# Patient Record
Sex: Female | Born: 1970 | Race: Black or African American | Hispanic: No | Marital: Single | Smoking: Never smoker
Health system: Southern US, Community
[De-identification: ages and names within clinical notes are randomized; demographics above are authoritative.]

## PROBLEM LIST (undated history)

## (undated) DIAGNOSIS — R569 Unspecified convulsions: Secondary | ICD-10-CM

## (undated) DIAGNOSIS — F4310 Post-traumatic stress disorder, unspecified: Secondary | ICD-10-CM

## (undated) DIAGNOSIS — G35 Multiple sclerosis: Secondary | ICD-10-CM

## (undated) DIAGNOSIS — I341 Nonrheumatic mitral (valve) prolapse: Secondary | ICD-10-CM

## (undated) DIAGNOSIS — G35D Multiple sclerosis, unspecified: Secondary | ICD-10-CM

## (undated) DIAGNOSIS — O020 Blighted ovum and nonhydatidiform mole: Secondary | ICD-10-CM

## (undated) DIAGNOSIS — G500 Trigeminal neuralgia: Secondary | ICD-10-CM

## (undated) DIAGNOSIS — G932 Benign intracranial hypertension: Secondary | ICD-10-CM

## (undated) DIAGNOSIS — D509 Iron deficiency anemia, unspecified: Secondary | ICD-10-CM

## (undated) HISTORY — PX: CARPAL TUNNEL RELEASE: SHX101

## (undated) HISTORY — PX: APPENDECTOMY: SHX54

## (undated) HISTORY — PX: VAGINAL HYSTERECTOMY: SUR661

## (undated) HISTORY — PX: BREAST SURGERY: SHX581

## (undated) HISTORY — PX: GASTRIC BYPASS: SHX52

## (undated) HISTORY — PX: BILATERAL SALPINGOOPHORECTOMY: SHX1223

## (undated) HISTORY — PX: CHOLECYSTECTOMY: SHX55

## (undated) HISTORY — PX: TONSILLECTOMY: SUR1361

---

## 2004-02-01 ENCOUNTER — Emergency Department (HOSPITAL_COMMUNITY): Admission: EM | Admit: 2004-02-01 | Discharge: 2004-02-01 | Payer: Self-pay | Admitting: Emergency Medicine

## 2004-02-11 ENCOUNTER — Emergency Department (HOSPITAL_COMMUNITY): Admission: EM | Admit: 2004-02-11 | Discharge: 2004-02-11 | Payer: Self-pay | Admitting: Emergency Medicine

## 2004-10-01 ENCOUNTER — Emergency Department (HOSPITAL_COMMUNITY): Admission: EM | Admit: 2004-10-01 | Discharge: 2004-10-01 | Payer: Self-pay | Admitting: Emergency Medicine

## 2005-09-03 ENCOUNTER — Emergency Department (HOSPITAL_COMMUNITY): Admission: EM | Admit: 2005-09-03 | Discharge: 2005-09-03 | Payer: Self-pay | Admitting: Emergency Medicine

## 2006-09-19 ENCOUNTER — Emergency Department (HOSPITAL_COMMUNITY): Admission: EM | Admit: 2006-09-19 | Discharge: 2006-09-19 | Payer: Self-pay | Admitting: Emergency Medicine

## 2006-12-15 ENCOUNTER — Emergency Department (HOSPITAL_COMMUNITY): Admission: EM | Admit: 2006-12-15 | Discharge: 2006-12-15 | Payer: Self-pay | Admitting: Emergency Medicine

## 2008-09-14 ENCOUNTER — Ambulatory Visit: Payer: Self-pay | Admitting: Radiology

## 2008-09-14 ENCOUNTER — Ambulatory Visit (HOSPITAL_BASED_OUTPATIENT_CLINIC_OR_DEPARTMENT_OTHER): Admission: RE | Admit: 2008-09-14 | Discharge: 2008-09-14 | Payer: Self-pay | Admitting: Family Medicine

## 2008-10-30 ENCOUNTER — Emergency Department (HOSPITAL_BASED_OUTPATIENT_CLINIC_OR_DEPARTMENT_OTHER): Admission: EM | Admit: 2008-10-30 | Discharge: 2008-10-30 | Payer: Self-pay | Admitting: Emergency Medicine

## 2008-10-30 ENCOUNTER — Ambulatory Visit: Payer: Self-pay | Admitting: Diagnostic Radiology

## 2009-03-17 ENCOUNTER — Emergency Department (HOSPITAL_COMMUNITY): Admission: EM | Admit: 2009-03-17 | Discharge: 2009-03-17 | Payer: Self-pay | Admitting: Family Medicine

## 2009-09-30 ENCOUNTER — Ambulatory Visit: Payer: Self-pay | Admitting: Diagnostic Radiology

## 2009-09-30 ENCOUNTER — Emergency Department (HOSPITAL_BASED_OUTPATIENT_CLINIC_OR_DEPARTMENT_OTHER): Admission: EM | Admit: 2009-09-30 | Discharge: 2009-09-30 | Payer: Self-pay | Admitting: Emergency Medicine

## 2009-11-19 ENCOUNTER — Emergency Department (HOSPITAL_BASED_OUTPATIENT_CLINIC_OR_DEPARTMENT_OTHER): Admission: EM | Admit: 2009-11-19 | Discharge: 2009-11-19 | Payer: Self-pay | Admitting: Emergency Medicine

## 2009-11-19 ENCOUNTER — Ambulatory Visit: Payer: Self-pay | Admitting: Diagnostic Radiology

## 2009-12-22 ENCOUNTER — Emergency Department (HOSPITAL_BASED_OUTPATIENT_CLINIC_OR_DEPARTMENT_OTHER): Admission: EM | Admit: 2009-12-22 | Discharge: 2009-12-22 | Payer: Self-pay | Admitting: Emergency Medicine

## 2010-05-17 LAB — CBC
HCT: 37.5 % (ref 36.0–46.0)
Hemoglobin: 12.9 g/dL (ref 12.0–15.0)
MCH: 33.1 pg (ref 26.0–34.0)
MCHC: 34.3 g/dL (ref 30.0–36.0)
MCV: 96.5 fL (ref 78.0–100.0)
Platelets: 181 10*3/uL (ref 150–400)
RBC: 3.89 MIL/uL (ref 3.87–5.11)
RDW: 12.7 % (ref 11.5–15.5)
WBC: 5.3 10*3/uL (ref 4.0–10.5)

## 2010-05-17 LAB — BASIC METABOLIC PANEL
BUN: 9 mg/dL (ref 6–23)
CO2: 29 mEq/L (ref 19–32)
Calcium: 9 mg/dL (ref 8.4–10.5)
Chloride: 105 mEq/L (ref 96–112)
Creatinine, Ser: 0.9 mg/dL (ref 0.4–1.2)
GFR calc Af Amer: >60 mL/min (ref >60)
GFR calc non Af Amer: >60 mL/min (ref >60)
Glucose, Bld: 95 mg/dL (ref 70–99)
Potassium: 4.2 mEq/L (ref 3.5–5.1)
Sodium: 141 mEq/L (ref 135–145)

## 2010-05-19 LAB — CBC
HCT: 36.1 % (ref 36.0–46.0)
Hemoglobin: 12.3 g/dL (ref 12.0–15.0)
MCH: 32.8 pg (ref 26.0–34.0)
MCHC: 34 g/dL (ref 30.0–36.0)
MCV: 96.2 fL (ref 78.0–100.0)
Platelets: 203 10*3/uL (ref 150–400)
RBC: 3.75 MIL/uL — ABNORMAL LOW (ref 3.87–5.11)
RDW: 12.8 % (ref 11.5–15.5)
WBC: 6.3 10*3/uL (ref 4.0–10.5)

## 2010-05-19 LAB — URINALYSIS, ROUTINE W REFLEX MICROSCOPIC
Bilirubin Urine: NEGATIVE
Glucose, UA: NEGATIVE mg/dL
Ketones, ur: NEGATIVE mg/dL
Leukocytes, UA: NEGATIVE
Nitrite: NEGATIVE
Protein, ur: NEGATIVE mg/dL
Specific Gravity, Urine: 1.022 (ref 1.005–1.030)
Urobilinogen, UA: 1 mg/dL (ref 0.0–1.0)
pH: 7 (ref 5.0–8.0)

## 2010-05-19 LAB — COMPREHENSIVE METABOLIC PANEL
ALT: 25 U/L (ref 0–35)
AST: 26 U/L (ref 0–37)
Albumin: 3.7 g/dL (ref 3.5–5.2)
Alkaline Phosphatase: 57 U/L (ref 39–117)
BUN: 10 mg/dL (ref 6–23)
CO2: 26 mEq/L (ref 19–32)
Calcium: 8.8 mg/dL (ref 8.4–10.5)
Chloride: 108 mEq/L (ref 96–112)
Creatinine, Ser: 0.9 mg/dL (ref 0.4–1.2)
GFR calc Af Amer: >60 mL/min (ref >60)
GFR calc non Af Amer: >60 mL/min (ref >60)
Glucose, Bld: 81 mg/dL (ref 70–99)
Potassium: 4 mEq/L (ref 3.5–5.1)
Sodium: 141 mEq/L (ref 135–145)
Total Bilirubin: 0.6 mg/dL (ref 0.3–1.2)
Total Protein: 6.7 g/dL (ref 6.0–8.3)

## 2010-05-19 LAB — URINE CULTURE
Colony Count: NO GROWTH
Culture: NO GROWTH

## 2010-05-19 LAB — URINE MICROSCOPIC-ADD ON

## 2010-05-19 LAB — DIFFERENTIAL
Basophils Absolute: 0 10*3/uL (ref 0.0–0.1)
Basophils Relative: 1 % (ref 0–1)
Eosinophils Absolute: 0.1 10*3/uL (ref 0.0–0.7)
Eosinophils Relative: 1 % (ref 0–5)
Lymphocytes Relative: 35 % (ref 12–46)
Lymphs Abs: 2.2 10*3/uL (ref 0.7–4.0)
Monocytes Absolute: 0.6 10*3/uL (ref 0.1–1.0)
Monocytes Relative: 9 % (ref 3–12)
Neutro Abs: 3.4 10*3/uL (ref 1.7–7.7)
Neutrophils Relative %: 55 % (ref 43–77)

## 2010-05-19 LAB — LIPASE, BLOOD: Lipase: 96 U/L (ref 23–300)

## 2010-05-19 LAB — PREGNANCY, URINE: Preg Test, Ur: NEGATIVE

## 2010-05-20 LAB — POCT URINALYSIS DIP (DEVICE)
Bilirubin Urine: NEGATIVE
Glucose, UA: NEGATIVE mg/dL
Ketones, ur: NEGATIVE mg/dL
Nitrite: NEGATIVE
Protein, ur: NEGATIVE mg/dL
Specific Gravity, Urine: 1.015 (ref 1.005–1.030)
Urobilinogen, UA: 0.2 mg/dL (ref 0.0–1.0)
pH: 5 (ref 5.0–8.0)

## 2010-05-20 LAB — URINE CULTURE
Colony Count: NO GROWTH
Culture: NO GROWTH

## 2010-06-09 LAB — COMPREHENSIVE METABOLIC PANEL
ALT: 16 U/L (ref 0–35)
Alkaline Phosphatase: 68 U/L (ref 39–117)
BUN: 9 mg/dL (ref 6–23)
CO2: 29 mEq/L (ref 19–32)
Calcium: 9.3 mg/dL (ref 8.4–10.5)
GFR calc non Af Amer: >60 mL/min (ref >60)
Glucose, Bld: 85 mg/dL (ref 70–99)
Sodium: 140 mEq/L (ref 135–145)

## 2010-06-09 LAB — DIFFERENTIAL
Basophils Relative: 1 % (ref 0–1)
Eosinophils Absolute: 0 10*3/uL (ref 0.0–0.7)
Neutro Abs: 4.4 10*3/uL (ref 1.7–7.7)
Neutrophils Relative %: 53 % (ref 43–77)

## 2010-06-09 LAB — URINALYSIS, ROUTINE W REFLEX MICROSCOPIC
Bilirubin Urine: NEGATIVE
Ketones, ur: NEGATIVE mg/dL
Nitrite: NEGATIVE
Protein, ur: NEGATIVE mg/dL
Urobilinogen, UA: 0.2 mg/dL (ref 0.0–1.0)

## 2010-06-09 LAB — CBC
HCT: 37.8 % (ref 36.0–46.0)
Hemoglobin: 13.2 g/dL (ref 12.0–15.0)
MCHC: 34.9 g/dL (ref 30.0–36.0)
MCV: 92.8 fL (ref 78.0–100.0)
Platelets: 201 10*3/uL (ref 150–400)
RBC: 4.07 MIL/uL (ref 3.87–5.11)
RDW: 11.9 % (ref 11.5–15.5)
WBC: 8.1 10*3/uL (ref 4.0–10.5)

## 2010-06-09 LAB — PREGNANCY, URINE: Preg Test, Ur: NEGATIVE

## 2010-06-09 LAB — LIPASE, BLOOD: Lipase: 79 U/L (ref 23–300)

## 2010-06-25 ENCOUNTER — Emergency Department (HOSPITAL_BASED_OUTPATIENT_CLINIC_OR_DEPARTMENT_OTHER)
Admission: EM | Admit: 2010-06-25 | Discharge: 2010-06-25 | Disposition: A | Discharge status: Home / Self Care | Payer: Federal, State, Local not specified - PPO | Attending: Emergency Medicine | Admitting: Emergency Medicine

## 2010-06-25 DIAGNOSIS — G43909 Migraine, unspecified, not intractable, without status migrainosus: Secondary | ICD-10-CM | POA: Insufficient documentation

## 2010-06-25 DIAGNOSIS — Z79899 Other long term (current) drug therapy: Secondary | ICD-10-CM | POA: Insufficient documentation

## 2010-07-20 NOTE — Consult Note (Signed)
NAME:  Joyce Lang, Joyce Lang                 ACCOUNT NO.:  [id number]   MEDICAL RECORD NO.:  [id number]          PATIENT TYPE:  EMS   LOCATION:  ED                           FACILITY:  [facility]   PHYSICIAN:  Lorne Skeens. Hoxworth, M.D.DATE OF BIRTH:  September 08, 1970   DATE OF CONSULTATION:  02/01/2004  DATE OF DISCHARGE:                                   CONSULTATION   CHIEF COMPLAINT:  Right upper quadrant abdominal pain, nausea and vomiting.   HISTORY OF PRESENT ILLNESS:  I was asked by Carleene Cooper, M.D. in the  [facility] Emergency Room to evaluate Ms. Joyce Lang. She is a 40 year old black  female with a fairly long history of recurrent worsening right upper  quadrant abdominal pain.  She states that she actually has been having  episodes of right upper quadrant abdominal pain for about 10 years.  Initially these were not particularly frequent or severe and she did not  seek medical attention.  However, over the past year or two and particularly  over the last several months, she has had progressively worsening symptoms.  She describes a severe aching pain that will come on fairly rapidly in the  right upper quadrant up under her rib cage. There is no radiation. She will  then have the onset of nausea and frequent vomiting.  The pain typically  would last for several hours and gradually go away.  Until recently, she  definitely noted that fatty or greasy food seemed to precipitate the pain  and then more recently it has come on without eating. She presented to the  [facility] Emergency Room today as she has been having essentially  continuing waxing and waning symptoms over the past three days.  She has not  noted any fever, chills or jaundice. Bowel movements are normal.   She has had a fairly extensive workup to date and actually had an  appointment at our office scheduled for the 9th of next month.  Her workup  initially was done in [place] which included an ultrasound of the  gallbladder that  was normal and a HIDA scan that she stated was borderline  abnormal. These were done earlier this year. I do not have these studies to  review but the reports have apparently been already sent to my office in  preparation for appointment next month.  She recently was evaluated by Dr.  Army Melia in [place] with an upper endoscopy.  This reportedly revealed a  hiatal hernia and some evidence of reflux esophagitis.  He recommended  Aciphex which she started two weeks ago but this has not affected her  symptoms.  He also recommended that she seek surgical consultation if the  Aciphex did not relieve her symptoms.  She also has a history of a  colonoscopy in 2003 due to strong family history of colon cancer.  Overall  her symptoms have progressed to the point where she feels they are really  intolerable.   PAST MEDICAL HISTORY:  Surgery significant for carpal tunnel release,  bilateral breast reduction, tonsillectomy, D&C and tubal ligation.  Medically she  has been treated for migraine headaches.   MEDICATIONS:  1.  Effexor 150 mg daily.  2.  Topamax 75 mg daily for headaches.   ALLERGIES:  None noted.   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 She is married with two children. Does not smoke  cigarettes or drink alcohol.   FAMILY HISTORY:  Significant for colon cancer in aunts and uncles on both  sides of the family.  Also significant for gallbladder disease, mother  status post cholecystectomy.   REVIEW OF SYMPTOMS:  GENERAL:  No fever, chills, weight change. HEENT:  No  vision, hearing or swallowing problems.  RESPIRATORY:  No shortness of  breath, cough, wheezing, history of asthma. CARDIAC:  She has been told she  had mitral valve prolapse in the distant past.  No chest pain, palpitations.  ABDOMEN/GI:  As above.  GU:  No urinary burning or frequency. HEMATOLOGIC:  No history of abnormal bleeding, blood clots. NEUROLOGIC:   Occasional  migraine headaches.   PHYSICAL EXAMINATION:  VITAL SIGNS:  Temperature 99, vital signs all within  normal limits.  GENERAL:  She is a mildly overweight black female who appears uncomfortable.  SKIN:  Warm and dry without rash or infection.  HEENT:  No palpable masses or thyromegaly, sclerae nonicteric, nares and  oropharynx clear.  LUNGS:  Clear to auscultation without increased work of breathing or  wheezing.  CARDIAC:  Regular rate and rhythm without murmurs, no edema. No JVD.  ABDOMEN:  There is moderate right upper quadrant tenderness, no guarding, no  masses, no hepatosplenomegaly. The remainder of the abdomen is soft and  nontender.  EXTREMITIES:  No joint swelling or deformity.  NEUROLOGIC:  Alert and oriented, gait normal.   LABORATORY AND X-RAY DATA:  In the emergency room today, CBC, LFTs, lipase,  urinalysis were all unremarkable.   CT scan of the abdomen and pelvis was obtained in the emergency room today  which showed some tiny follicular cysts in both ovaries but is essentially  negative.   ASSESSMENT/PLAN:  A 40 year old black female with long standing and  progressively worsening episode right upper quadrant abdominal pain and  nausea entirely consistent with biliary tract disease. She has had a  thorough workup including gallbladder ultrasound and questionably abnormal  HIDA scan.  I will need to review these studies. She has had an upper  endoscopy showing only reflux and has had no response to proton pump  inhibitors.  CT scan of the abdomen and pelvis was normal.  Colonoscopy was  normal.  I suspect she may well have biliary dyskinesia.  I do not think  there is any further workup that would be helpful. Her symptoms have become  intolerable. We discussed the option of proceeding with laparoscopic  cholecystectomy with cholangiogram in an effort to relieve her symptoms. She understands in this setting that results of the surgery are not 100%   reliable for pain relief and that about 50-75% of patients will obtain  relief from surgery. At this point, she feels her symptoms are not tolerable  and would like to proceed with cholecystectomy in an effort to relieve her  pain and I think this would be a reasonable approach. Will schedule this in  the near future.  The risk of bleeding, infection, bile leak and bile duct  injury were discussed with the patient and her husband.     Benj   BTH/MEDQ  D:  02/01/2004  T:  02/01/2004  Job:  [id number]

## 2010-12-15 ENCOUNTER — Encounter: Payer: Self-pay | Admitting: *Deleted

## 2010-12-15 ENCOUNTER — Emergency Department (HOSPITAL_BASED_OUTPATIENT_CLINIC_OR_DEPARTMENT_OTHER)
Admission: EM | Admit: 2010-12-15 | Discharge: 2010-12-16 | Disposition: A | Discharge status: Home / Self Care | Payer: Federal, State, Local not specified - PPO | Attending: Emergency Medicine | Admitting: Emergency Medicine

## 2010-12-15 ENCOUNTER — Other Ambulatory Visit: Payer: Self-pay

## 2010-12-15 DIAGNOSIS — Z79899 Other long term (current) drug therapy: Secondary | ICD-10-CM | POA: Insufficient documentation

## 2010-12-15 DIAGNOSIS — R112 Nausea with vomiting, unspecified: Secondary | ICD-10-CM | POA: Insufficient documentation

## 2010-12-15 DIAGNOSIS — G43901 Migraine, unspecified, not intractable, with status migrainosus: Secondary | ICD-10-CM

## 2010-12-15 DIAGNOSIS — G43809 Other migraine, not intractable, without status migrainosus: Secondary | ICD-10-CM | POA: Insufficient documentation

## 2010-12-15 HISTORY — DX: Benign intracranial hypertension: G93.2

## 2010-12-15 HISTORY — DX: Trigeminal neuralgia: G50.0

## 2010-12-15 HISTORY — DX: Blighted ovum and nonhydatidiform mole: O02.0

## 2010-12-15 HISTORY — DX: Unspecified convulsions: R56.9

## 2010-12-15 HISTORY — DX: Post-traumatic stress disorder, unspecified: F43.10

## 2010-12-15 MED ORDER — DEXAMETHASONE SODIUM PHOSPHATE 10 MG/ML IJ SOLN
10.0000 mg | Freq: Once | INTRAMUSCULAR | Status: AC
  Administered 2010-12-16: 10 mg via INTRAVENOUS
  Filled 2010-12-15: qty 1

## 2010-12-15 MED ORDER — SODIUM CHLORIDE 0.9 % IV BOLUS (SEPSIS)
1000.0000 mL | Freq: Once | INTRAVENOUS | Status: AC
  Administered 2010-12-16: 1000 mL via INTRAVENOUS

## 2010-12-15 MED ORDER — DIPHENHYDRAMINE HCL 50 MG/ML IJ SOLN
25.0000 mg | Freq: Once | INTRAMUSCULAR | Status: AC
  Administered 2010-12-16: 25 mg via INTRAVENOUS
  Filled 2010-12-15: qty 1

## 2010-12-15 MED ORDER — METOCLOPRAMIDE HCL 5 MG/ML IJ SOLN
10.0000 mg | Freq: Once | INTRAMUSCULAR | Status: AC
  Administered 2010-12-16: 10 mg via INTRAVENOUS
  Filled 2010-12-15 (×2): qty 2

## 2010-12-15 NOTE — ED Notes (Signed)
Pt reports mha x 3 weeks- saw her neurologist 1.5 weeks ago- and received magnesium infusion in office- has been taking vicodin, imitrex and phenergan without relief

## 2010-12-15 NOTE — ED Provider Notes (Addendum)
History    Scribed for Hanley Seamen, MD, the patient was seen in room MH06/MH06. This chart was scribed by Katha Cabal. This patient's care was started at 11:49 PM.    CSN: [id number] Arrival date & time: 12/15/2010 10:58 PM  Chief Complaint  Patient presents with  . Migraine    (Consider location/radiation/quality/duration/timing/severity/associated sxs/prior treatment) HPI  Joyce Lang is a 40 y.o. female who presents to the Emergency Department complaining of ongoing  severe migraine headache for the past 3 weeks with associated nausea and vomiting.  Typical right sided migraine that makes patients face feel numb.  Pain is aggravated by light and sound.  Patient given Toradol by neurologist but pain returned in an hour.  Patient took Phenergan, Imitrex and Vicodin without relief.  Patient states she "can't function with the pain."   Patient states that migraines in past were successfully treated with steroids, IV pain medication and antiemetic.  Patient has hx of  Pseudotumor cerebri but states sx are not the same.     Past Medical History  Diagnosis Date  . Migraine   . Trigeminal neuralgia   . Molar pregnancy   . Seizures   . PTSD (post-traumatic stress disorder)   . Pseudotumor cerebri     Past Surgical History  Procedure Date  . Appendectomy   . Breast surgery   . Cholecystectomy   . Gastric bypass   . Abdominal hysterectomy   . Tonsillectomy   . Carpal tunnel release     No family history on file.  History  Substance Use Topics  . Smoking status: Never Smoker   . Smokeless tobacco: Never Used  . Alcohol Use: Yes     rare    OB History    Grav Para Term Preterm Abortions TAB SAB Ect Mult Living                  Review of Systems  All other systems reviewed and are negative.    Allergies  Demerol and Morphine and related  Home Medications   Current Outpatient Rx  Name Route Sig Dispense Refill  . ACETAZOLAMIDE 250 MG PO TABS Oral Take 250  mg by mouth 2 (two) times daily.      . BUSPIRONE HCL 7.5 MG PO TABS Oral Take 7.5 mg by mouth daily.      Marland Kitchen CETIRIZINE HCL 10 MG PO TABS Oral Take 10 mg by mouth daily.      Marland Kitchen CITALOPRAM HYDROBROMIDE 10 MG PO TABS Oral Take 10 mg by mouth daily.      Marland Kitchen GABAPENTIN 600 MG PO TABS Oral Take 600 mg by mouth daily.      Marland Kitchen HYDROCODONE-ACETAMINOPHEN 5-325 MG PO TABS Oral Take 1 tablet by mouth every 6 (six) hours as needed.      Marland Kitchen LAMOTRIGINE 200 MG PO TB24 Oral Take 1 tablet by mouth daily.      Marland Kitchen PROMETHAZINE HCL 25 MG PO TABS Oral Take 25 mg by mouth every 6 (six) hours as needed.      . SUMATRIPTAN SUCCINATE 100 MG PO TABS Oral Take 100 mg by mouth every 2 (two) hours as needed.      . TOPIRAMATE 100 MG PO TABS Oral Take 100 mg by mouth 2 (two) times daily.      Marland Kitchen VALACYCLOVIR HCL 1 G PO TABS Oral Take 1,000 mg by mouth daily.        BP 199/80  Pulse 85  Temp(Src) 97.9 F (36.6 C) (Oral)  Resp 20  Ht 5\' 6"  (1.676 m)  Wt 163 lb (73.936 kg)  BMI 26.31 kg/m2  SpO2 100%  Physical Exam General: Well-developed, well-nourished female in no acute distress; appearance consistent with age of record HENT: normocephalic, atraumatic Eyes: pupils equal round and reactive to light; extraocular muscles intact Neck: supple Heart: regular rate and rhythm; no murmurs, rubs or gallops Lungs: clear to auscultation bilaterally Abdomen: soft; nontender; nondistended; no masses or hepatosplenomegaly; bowel sounds present Extremities: No deformity; full range of motion; pulses normal Neurologic: Awake, alert and oriented;motor function intact in all extremities and symmetric; no facial droop Skin: Warm and dry Psychiatric: Normal mood and affect   ED Course  Procedures (including critical care time)   DIAGNOSTIC STUDIES: Oxygen Saturation is 100% on room air, normal by my interpretation.    COORDINATION OF CARE:  11:49 PM  Patient having EKG.   11:55 PM  Physical exam complete.  Pain Control.   Will review past records.    12:34 AM Patient states she is feeling better and wished to be discharged home at this time. She states she's had an excellent relief from similar medication combinations tenderness ED in the past.  EKG Interpretation:  Date & Time: 12/16/2010 11:51 PM  Rate: 89  Rhythm: normal sinus rhythm  QRS Axis: normal  Intervals: normal  ST/T Wave abnormalities: normal  Conduction Disutrbances:none  Narrative Interpretation:   Old EKG Reviewed: none available       Hanley Seamen, MD 12/16/10 0034  Hanley Seamen, MD 12/16/10 [phone]

## 2010-12-16 NOTE — ED Notes (Signed)
Pt d/c home with family to drive- no rx given

## 2010-12-17 LAB — COMPREHENSIVE METABOLIC PANEL
ALT: 25
AST: 23
Albumin: 3.7
Calcium: 8.7
GFR calc Af Amer: >60
Glucose, Bld: 94
Sodium: 134 — ABNORMAL LOW
Total Protein: 6.8

## 2010-12-17 LAB — DIFFERENTIAL
Eosinophils Absolute: 0.1
Lymphs Abs: 3.2
Monocytes Relative: 6
Neutrophils Relative %: 62

## 2010-12-17 LAB — CBC
MCHC: 34.4
Platelets: 188
RDW: 12.5

## 2010-12-17 LAB — POCT CARDIAC MARKERS
CKMB, poc: <1 — ABNORMAL LOW
Troponin i, poc: <0.05

## 2010-12-17 LAB — TSH: TSH: 0.855

## 2010-12-28 ENCOUNTER — Other Ambulatory Visit: Payer: Self-pay | Admitting: Family Medicine

## 2010-12-28 DIAGNOSIS — Z1231 Encounter for screening mammogram for malignant neoplasm of breast: Secondary | ICD-10-CM

## 2011-01-14 ENCOUNTER — Ambulatory Visit: Payer: Federal, State, Local not specified - PPO

## 2011-01-14 ENCOUNTER — Ambulatory Visit
Admission: RE | Admit: 2011-01-14 | Discharge: 2011-01-14 | Disposition: A | Discharge status: Home / Self Care | Payer: Federal, State, Local not specified - PPO | Source: Ambulatory Visit | Attending: Family Medicine | Admitting: Family Medicine

## 2011-01-14 DIAGNOSIS — Z1231 Encounter for screening mammogram for malignant neoplasm of breast: Secondary | ICD-10-CM

## 2011-01-15 ENCOUNTER — Other Ambulatory Visit: Payer: Self-pay | Admitting: Family Medicine

## 2011-01-15 DIAGNOSIS — N644 Mastodynia: Secondary | ICD-10-CM

## 2011-01-30 ENCOUNTER — Ambulatory Visit
Admission: RE | Admit: 2011-01-30 | Discharge: 2011-01-30 | Disposition: A | Discharge status: Home / Self Care | Payer: Federal, State, Local not specified - PPO | Source: Ambulatory Visit | Attending: Family Medicine | Admitting: Family Medicine

## 2011-01-30 DIAGNOSIS — N644 Mastodynia: Secondary | ICD-10-CM

## 2011-05-24 ENCOUNTER — Emergency Department (HOSPITAL_BASED_OUTPATIENT_CLINIC_OR_DEPARTMENT_OTHER)
Admission: EM | Admit: 2011-05-24 | Discharge: 2011-05-24 | Disposition: A | Discharge status: Home / Self Care | Payer: Federal, State, Local not specified - PPO | Attending: Emergency Medicine | Admitting: Emergency Medicine

## 2011-05-24 ENCOUNTER — Emergency Department (INDEPENDENT_AMBULATORY_CARE_PROVIDER_SITE_OTHER): Payer: Federal, State, Local not specified - PPO

## 2011-05-24 ENCOUNTER — Encounter (HOSPITAL_BASED_OUTPATIENT_CLINIC_OR_DEPARTMENT_OTHER): Payer: Self-pay | Admitting: *Deleted

## 2011-05-24 DIAGNOSIS — G43909 Migraine, unspecified, not intractable, without status migrainosus: Secondary | ICD-10-CM | POA: Insufficient documentation

## 2011-05-24 DIAGNOSIS — R51 Headache: Secondary | ICD-10-CM

## 2011-05-24 DIAGNOSIS — Z79899 Other long term (current) drug therapy: Secondary | ICD-10-CM | POA: Insufficient documentation

## 2011-05-24 DIAGNOSIS — R110 Nausea: Secondary | ICD-10-CM | POA: Insufficient documentation

## 2011-05-24 MED ORDER — PROMETHAZINE HCL 25 MG/ML IJ SOLN
12.5000 mg | Freq: Once | INTRAMUSCULAR | Status: AC
  Administered 2011-05-24: 12.5 mg via INTRAVENOUS
  Filled 2011-05-24: qty 1

## 2011-05-24 MED ORDER — FENTANYL CITRATE 0.05 MG/ML IJ SOLN
50.0000 ug | Freq: Once | INTRAMUSCULAR | Status: AC
  Administered 2011-05-24: 08:00:00 via INTRAVENOUS
  Filled 2011-05-24: qty 2

## 2011-05-24 MED ORDER — DIPHENHYDRAMINE HCL 50 MG/ML IJ SOLN
25.0000 mg | Freq: Once | INTRAMUSCULAR | Status: AC
  Administered 2011-05-24: 25 mg via INTRAVENOUS
  Filled 2011-05-24: qty 1

## 2011-05-24 MED ORDER — KETOROLAC TROMETHAMINE 30 MG/ML IJ SOLN
30.0000 mg | Freq: Once | INTRAMUSCULAR | Status: AC
  Administered 2011-05-24: 30 mg via INTRAVENOUS
  Filled 2011-05-24: qty 1

## 2011-05-24 MED ORDER — SODIUM CHLORIDE 0.9 % IV BOLUS (SEPSIS)
1000.0000 mL | Freq: Once | INTRAVENOUS | Status: AC
  Administered 2011-05-24: 1000 mL via INTRAVENOUS

## 2011-05-24 MED ORDER — METOCLOPRAMIDE HCL 5 MG/ML IJ SOLN
10.0000 mg | Freq: Once | INTRAMUSCULAR | Status: AC
  Administered 2011-05-24: 10 mg via INTRAVENOUS
  Filled 2011-05-24: qty 2

## 2011-05-24 MED ORDER — DIVALPROEX SODIUM 250 MG PO DR TAB
500.0000 mg | DELAYED_RELEASE_TABLET | Freq: Once | ORAL | Status: DC
  Filled 2011-05-24: qty 1

## 2011-05-24 NOTE — Discharge Instructions (Signed)
Migraine Headache  A migraine is very bad pain on one or both sides of your head. The cause of a migraine is not always known. A migraine can be triggered or caused by different things, such as:   Alcohol.   Smoking.   Stress.   Periods (menstruation) in women.   Aged cheeses.   Foods or drinks that contain nitrates, glutamate, aspartame, or tyramine.   Lack of sleep.   Chocolate.   Caffeine.   Hunger.   Medicines, such as nitroglycerine (used to treat chest pain), birth control pills, estrogen, and some blood pressure medicines.  HOME CARE   Many medicines can help migraine pain or keep migraines from coming back. Your doctor can help you decide on a medicine or treatment program.   If you or your child gets a migraine, it may help to lie down in a dark, quiet room.   Keep a headache journal. This may help find out what is causing the headaches. For example, write down:   What you eat and drink.   How much sleep you get.   Any change to your diet or medicines.  GET HELP RIGHT AWAY IF:    The medicine does not work.   The pain begins again.   The neck is stiff.   You have trouble seeing.   The muscles are weak or you lose muscle control.   You have new symptoms.   You lose your balance.   You have trouble walking.   You feel faint or pass out.  MAKE SURE YOU:    Understand these instructions.   Will watch this condition.   Will get help right away if you are not doing well or get worse.  Document Released: 11/28/2007 Document Revised: 02/07/2011 Document Reviewed: 10/24/2008  ExitCare Patient Information 2012 [employer].

## 2011-05-24 NOTE — ED Notes (Signed)
Dr. Palumbo at bedside. 

## 2011-05-24 NOTE — ED Notes (Signed)
C/o continuous headache x2 weeks, but has worsened severely 2days ago. Pt states she gets migraines frequntly "but this is a bad one". + photosensitivity. +nausea, no vomiting.

## 2011-05-24 NOTE — ED Provider Notes (Signed)
History     CSN: [id number]  Arrival date & time 05/24/11  0546   First MD Initiated Contact with Patient 05/24/11 [phone]      Chief Complaint  Patient presents with  . Headache    (Consider location/radiation/quality/duration/timing/severity/associated sxs/prior treatment) Patient is a 41 y.o. female presenting with headaches. The history is provided by the patient. No language interpreter was used.  Headache  This is a recurrent problem. The current episode started more than 1 week ago. The problem occurs constantly. The problem has not changed since onset.The headache is associated with bright light. The pain is located in the occipital and right unilateral region. The pain is at a severity of 9/10. The pain is severe. The pain does not radiate. Associated symptoms include nausea. Pertinent negatives include no anorexia, no fever, no malaise/fatigue, no chest pressure, no near-syncope, no orthopnea, no palpitations, no syncope, no shortness of breath and no vomiting. She has tried resting in a darkened room and triptan therapy (IV magnesium) for the symptoms. The treatment provided mild relief.    Past Medical History  Diagnosis Date  . Migraine   . Trigeminal neuralgia   . Molar pregnancy   . Seizures   . PTSD (post-traumatic stress disorder)   . Pseudotumor cerebri     Past Surgical History  Procedure Date  . Appendectomy   . Breast surgery   . Cholecystectomy   . Gastric bypass   . Abdominal hysterectomy   . Tonsillectomy   . Carpal tunnel release     No family history on file.  History  Substance Use Topics  . Smoking status: Never Smoker   . Smokeless tobacco: Never Used  . Alcohol Use: Yes     rare    OB History    Grav Para Term Preterm Abortions TAB SAB Ect Mult Living                  Review of Systems  Constitutional: Negative for fever and malaise/fatigue.  HENT: Negative for facial swelling and neck stiffness.   Eyes: Negative.  Negative for  visual disturbance.  Respiratory: Negative for shortness of breath.   Cardiovascular: Negative for palpitations, orthopnea, syncope and near-syncope.  Gastrointestinal: Positive for nausea. Negative for vomiting and anorexia.  Genitourinary: Negative.   Musculoskeletal: Negative.   Skin: Negative.   Neurological: Positive for headaches.  Hematological: Negative.   Psychiatric/Behavioral: Negative.     Allergies  Demerol and Morphine and related  Home Medications   Current Outpatient Rx  Name Route Sig Dispense Refill  . ACETAZOLAMIDE 250 MG PO TABS Oral Take 375 mg by mouth 2 (two) times daily.     . BUSPIRONE HCL 7.5 MG PO TABS Oral Take 7.5 mg by mouth daily.      Marland Kitchen CETIRIZINE HCL 10 MG PO TABS Oral Take 10 mg by mouth daily.      Marland Kitchen CITALOPRAM HYDROBROMIDE 10 MG PO TABS Oral Take 10 mg by mouth daily.      Marland Kitchen GABAPENTIN 600 MG PO TABS Oral Take 600 mg by mouth daily.      Marland Kitchen HYDROCODONE-ACETAMINOPHEN 5-325 MG PO TABS Oral Take 1 tablet by mouth every 6 (six) hours as needed.      Marland Kitchen LAMOTRIGINE 100 MG PO TABS Oral Take 300 mg by mouth daily.    Marland Kitchen PROMETHAZINE HCL 25 MG PO TABS Oral Take 25 mg by mouth every 6 (six) hours as needed.      . SUMATRIPTAN  SUCCINATE 100 MG PO TABS Oral Take 100 mg by mouth every 2 (two) hours as needed.      . TOPIRAMATE 100 MG PO TABS Oral Take 100 mg by mouth 2 (two) times daily.      Marland Kitchen VALACYCLOVIR HCL 1 G PO TABS Oral Take 1,000 mg by mouth daily.      Marland Kitchen LAMOTRIGINE ER 200 MG PO TB24 Oral Take 1 tablet by mouth daily.       BP 109/75  Pulse 85  Temp(Src) 98.1 F (36.7 C) (Oral)  Resp 18  Ht 5\' 6"  (1.676 m)  Wt 170 lb (77.111 kg)  BMI 27.44 kg/m2  SpO2 100%  Physical Exam  Constitutional: She is oriented to person, place, and time. She appears well-developed and well-nourished. No distress.  HENT:  Head: Normocephalic and atraumatic.  Right Ear: External ear normal.  Left Ear: External ear normal.  Mouth/Throat: Oropharynx is clear and  moist. No oropharyngeal exudate.  Eyes: Conjunctivae and EOM are normal. Pupils are equal, round, and reactive to light.  Neck: Normal range of motion. Neck supple.  Cardiovascular: Normal rate and regular rhythm.   Pulmonary/Chest: Effort normal and breath sounds normal. She has no wheezes. She has no rales.  Abdominal: Soft. Bowel sounds are normal.  Musculoskeletal: Normal range of motion.  Neurological: She is alert and oriented to person, place, and time. She has normal reflexes. No cranial nerve deficit.  Skin: Skin is warm and dry.  Psychiatric: She has a normal mood and affect.    ED Course  Procedures (including critical care time)  Labs Reviewed - No data to display No results found.   No diagnosis found.    MDM  No suspicion for meningitis, nor bleed.  Typical migraine.  No change in extraocular movement nor change in cognition.  Patient not allergic to fentanyl, EDP asked specifically        Jaceion Aday K Weaver Tweed-Rasch, MD 05/24/11 [phone]

## 2011-08-16 ENCOUNTER — Ambulatory Visit (INDEPENDENT_AMBULATORY_CARE_PROVIDER_SITE_OTHER): Payer: Federal, State, Local not specified - PPO | Admitting: Internal Medicine

## 2011-08-16 DIAGNOSIS — Z111 Encounter for screening for respiratory tuberculosis: Secondary | ICD-10-CM

## 2012-02-28 ENCOUNTER — Other Ambulatory Visit: Payer: Self-pay | Admitting: Family Medicine

## 2012-02-28 DIAGNOSIS — Z1231 Encounter for screening mammogram for malignant neoplasm of breast: Secondary | ICD-10-CM

## 2012-03-02 ENCOUNTER — Emergency Department (HOSPITAL_BASED_OUTPATIENT_CLINIC_OR_DEPARTMENT_OTHER)
Admission: EM | Admit: 2012-03-02 | Discharge: 2012-03-03 | Disposition: A | Discharge status: Home / Self Care | Payer: Federal, State, Local not specified - PPO | Attending: Emergency Medicine | Admitting: Emergency Medicine

## 2012-03-02 ENCOUNTER — Encounter (HOSPITAL_BASED_OUTPATIENT_CLINIC_OR_DEPARTMENT_OTHER): Payer: Self-pay

## 2012-03-02 DIAGNOSIS — G35 Multiple sclerosis: Secondary | ICD-10-CM | POA: Insufficient documentation

## 2012-03-02 DIAGNOSIS — G932 Benign intracranial hypertension: Secondary | ICD-10-CM | POA: Insufficient documentation

## 2012-03-02 DIAGNOSIS — R51 Headache: Secondary | ICD-10-CM

## 2012-03-02 DIAGNOSIS — R110 Nausea: Secondary | ICD-10-CM | POA: Insufficient documentation

## 2012-03-02 DIAGNOSIS — Z8742 Personal history of other diseases of the female genital tract: Secondary | ICD-10-CM | POA: Insufficient documentation

## 2012-03-02 DIAGNOSIS — R109 Unspecified abdominal pain: Secondary | ICD-10-CM | POA: Insufficient documentation

## 2012-03-02 DIAGNOSIS — Z79899 Other long term (current) drug therapy: Secondary | ICD-10-CM | POA: Insufficient documentation

## 2012-03-02 DIAGNOSIS — H53149 Visual discomfort, unspecified: Secondary | ICD-10-CM | POA: Insufficient documentation

## 2012-03-02 DIAGNOSIS — Z8679 Personal history of other diseases of the circulatory system: Secondary | ICD-10-CM | POA: Insufficient documentation

## 2012-03-02 DIAGNOSIS — F4310 Post-traumatic stress disorder, unspecified: Secondary | ICD-10-CM | POA: Insufficient documentation

## 2012-03-02 DIAGNOSIS — G500 Trigeminal neuralgia: Secondary | ICD-10-CM | POA: Insufficient documentation

## 2012-03-02 DIAGNOSIS — G43909 Migraine, unspecified, not intractable, without status migrainosus: Secondary | ICD-10-CM | POA: Insufficient documentation

## 2012-03-02 HISTORY — DX: Multiple sclerosis, unspecified: G35.D

## 2012-03-02 HISTORY — DX: Multiple sclerosis: G35

## 2012-03-02 NOTE — ED Notes (Signed)
Migraine since 3 am.

## 2012-03-03 MED ORDER — DIPHENHYDRAMINE HCL 50 MG/ML IJ SOLN
25.0000 mg | Freq: Once | INTRAMUSCULAR | Status: AC
  Administered 2012-03-03: 25 mg via INTRAVENOUS
  Filled 2012-03-03: qty 1

## 2012-03-03 MED ORDER — METOCLOPRAMIDE HCL 5 MG/ML IJ SOLN
10.0000 mg | Freq: Once | INTRAMUSCULAR | Status: AC
  Administered 2012-03-03: 10 mg via INTRAVENOUS
  Filled 2012-03-03: qty 2

## 2012-03-03 MED ORDER — DEXAMETHASONE SODIUM PHOSPHATE 10 MG/ML IJ SOLN
10.0000 mg | Freq: Once | INTRAMUSCULAR | Status: AC
  Administered 2012-03-03: 10 mg via INTRAVENOUS
  Filled 2012-03-03: qty 1

## 2012-03-03 NOTE — ED Provider Notes (Signed)
History  This chart was scribed for Joya Gaskins, MD by Manuela Schwartz, ED scribe. This patient was seen in room MH04/MH04 and the patient's care was started at 2151.   CSN: [id number]  Arrival date & time 03/02/12  2151   First MD Initiated Contact with Patient 03/02/12 2357      Chief Complaint  Patient presents with  . Migraine   Patient is a 41 y.o. female presenting with migraines. The history is provided by the patient. No language interpreter was used.  Migraine This is a new problem. The current episode started 12 to 24 hours ago. The problem occurs constantly. The problem has been gradually worsening. Associated symptoms include abdominal pain and headaches. Pertinent negatives include no chest pain and no shortness of breath. Nothing aggravates the symptoms. Nothing relieves the symptoms. Treatments tried: phenergan. The treatment provided no relief.   Joyce Lang is a 41 y.o. female who presents to the Emergency Department w/hx of migraines complaining of a constant gradually worsening right sided migraine which began about 24 hours ago. She denies any recent trauma. She states her migraine feels similar to previous migraines that she has had except now it is mildly making her abdomen painful. She states associated nausea, photophobia. She denies visual disturbances, CP, SOB, rash, leg pain/swelling.  No head trauma is reported  Past Medical History  Diagnosis Date  . Migraine   . Trigeminal neuralgia   . Molar pregnancy   . Seizures   . PTSD (post-traumatic stress disorder)   . Pseudotumor cerebri   . MS (multiple sclerosis)     Past Surgical History  Procedure Date  . Appendectomy   . Breast surgery   . Cholecystectomy   . Gastric bypass   . Abdominal hysterectomy   . Tonsillectomy   . Carpal tunnel release     No family history on file.  History  Substance Use Topics  . Smoking status: Never Smoker   . Smokeless tobacco: Never Used  . Alcohol Use: No      OB History    Grav Para Term Preterm Abortions TAB SAB Ect Mult Living                  Review of Systems  Constitutional: Negative for fever and chills.  HENT: Negative for congestion and rhinorrhea.   Respiratory: Negative for shortness of breath.   Cardiovascular: Negative for chest pain.  Gastrointestinal: Positive for abdominal pain. Negative for nausea, vomiting and diarrhea.  Musculoskeletal: Negative for back pain.  Skin: Negative for color change.  Neurological: Positive for headaches. Negative for weakness.  All other systems reviewed and are negative.    Allergies  Demerol and Morphine and related  Home Medications   Current Outpatient Rx  Name  Route  Sig  Dispense  Refill  . CLONAZEPAM 1 MG PO TABS   Oral   Take 1 mg by mouth daily.         . ACETAZOLAMIDE 250 MG PO TABS   Oral   Take 375 mg by mouth 2 (two) times daily.          . BUSPIRONE HCL 7.5 MG PO TABS   Oral   Take 7.5 mg by mouth daily.           Marland Kitchen CETIRIZINE HCL 10 MG PO TABS   Oral   Take 10 mg by mouth daily.           Marland Kitchen CITALOPRAM HYDROBROMIDE 10  MG PO TABS   Oral   Take 10 mg by mouth daily.           Marland Kitchen GABAPENTIN 600 MG PO TABS   Oral   Take 600 mg by mouth daily.           Marland Kitchen HYDROCODONE-ACETAMINOPHEN 5-325 MG PO TABS   Oral   Take 1 tablet by mouth every 6 (six) hours as needed.           Marland Kitchen LAMOTRIGINE ER 200 MG PO TB24   Oral   Take 1 tablet by mouth daily.          Marland Kitchen LAMOTRIGINE 100 MG PO TABS   Oral   Take 300 mg by mouth daily.         Marland Kitchen PROMETHAZINE HCL 25 MG PO TABS   Oral   Take 25 mg by mouth every 6 (six) hours as needed.           . SUMATRIPTAN SUCCINATE 100 MG PO TABS   Oral   Take 100 mg by mouth every 2 (two) hours as needed.           . TOPIRAMATE 100 MG PO TABS   Oral   Take 100 mg by mouth 2 (two) times daily.           Marland Kitchen VALACYCLOVIR HCL 1 G PO TABS   Oral   Take 1,000 mg by mouth daily.             Triage  vitals: BP 113/74  Pulse 92  Temp 98.4 F (36.9 C) (Oral)  Resp 16  Ht 5\' 6"  (1.676 m)  Wt 165 lb (74.844 kg)  BMI 26.63 kg/m2  SpO2 98%  Physical Exam CONSTITUTIONAL: Well developed/well nourished HEAD AND FACE: Normocephalic/atraumatic EYES: EOMI/PERRL, normal fundoscopic exam ENMT: Mucous membranes moist NECK: supple no meningeal signs SPINE:entire spine nontender CV: S1/S2 noted, no murmurs/rubs/gallops noted LUNGS: Lungs are clear to auscultation bilaterally, no apparent distress ABDOMEN: soft, nontender, no rebound or guarding GU:no cva tenderness NEURO: Awake/alert, facies symmetric, no arm or leg drift is noted Cranial nerves 3/4/5/6/09/09/08/11/12 tested and intact Gait normal No past pointing,  EXTREMITIES: pulses normal, full ROM SKIN: warm, color normal PSYCH: no abnormalities of mood noted  ED Course  Procedures  DIAGNOSTIC STUDIES: Oxygen Saturation is 98% on room air, normal by my interpretation.    COORDINATION OF CARE: At 1220 AM Discussed treatment plan with patient which includes reglan, benadryl, decardon. Patient agrees.      MDM  Nursing notes including past medical history and social history reviewed and considered in documentation   I personally performed the services described in this documentation, which was scribed in my presence. The recorded information has been reviewed and is accurate.            Joya Gaskins, MD 03/03/12 [phone]

## 2012-03-19 ENCOUNTER — Ambulatory Visit
Admission: RE | Admit: 2012-03-19 | Discharge: 2012-03-19 | Disposition: A | Discharge status: Home / Self Care | Payer: Federal, State, Local not specified - PPO | Source: Ambulatory Visit | Attending: Family Medicine | Admitting: Family Medicine

## 2012-03-19 DIAGNOSIS — Z1231 Encounter for screening mammogram for malignant neoplasm of breast: Secondary | ICD-10-CM

## 2012-04-12 ENCOUNTER — Emergency Department (HOSPITAL_BASED_OUTPATIENT_CLINIC_OR_DEPARTMENT_OTHER)
Admission: EM | Admit: 2012-04-12 | Discharge: 2012-04-12 | Disposition: A | Discharge status: Home / Self Care | Payer: Federal, State, Local not specified - PPO | Attending: Emergency Medicine | Admitting: Emergency Medicine

## 2012-04-12 ENCOUNTER — Encounter (HOSPITAL_BASED_OUTPATIENT_CLINIC_OR_DEPARTMENT_OTHER): Payer: Self-pay | Admitting: *Deleted

## 2012-04-12 DIAGNOSIS — Z79899 Other long term (current) drug therapy: Secondary | ICD-10-CM | POA: Insufficient documentation

## 2012-04-12 DIAGNOSIS — T4995XA Adverse effect of unspecified topical agent, initial encounter: Secondary | ICD-10-CM | POA: Insufficient documentation

## 2012-04-12 DIAGNOSIS — T7840XA Allergy, unspecified, initial encounter: Secondary | ICD-10-CM

## 2012-04-12 DIAGNOSIS — Z8669 Personal history of other diseases of the nervous system and sense organs: Secondary | ICD-10-CM | POA: Insufficient documentation

## 2012-04-12 DIAGNOSIS — G43909 Migraine, unspecified, not intractable, without status migrainosus: Secondary | ICD-10-CM | POA: Insufficient documentation

## 2012-04-12 DIAGNOSIS — G40909 Epilepsy, unspecified, not intractable, without status epilepticus: Secondary | ICD-10-CM | POA: Insufficient documentation

## 2012-04-12 DIAGNOSIS — G35 Multiple sclerosis: Secondary | ICD-10-CM | POA: Insufficient documentation

## 2012-04-12 DIAGNOSIS — L509 Urticaria, unspecified: Secondary | ICD-10-CM

## 2012-04-12 MED ORDER — DIPHENHYDRAMINE HCL 50 MG/ML IJ SOLN
50.0000 mg | Freq: Once | INTRAMUSCULAR | Status: AC
  Administered 2012-04-12: 50 mg via INTRAVENOUS
  Filled 2012-04-12: qty 1

## 2012-04-12 MED ORDER — METHYLPREDNISOLONE SODIUM SUCC 125 MG IJ SOLR
125.0000 mg | Freq: Once | INTRAMUSCULAR | Status: AC
  Administered 2012-04-12: 125 mg via INTRAVENOUS
  Filled 2012-04-12: qty 2

## 2012-04-12 MED ORDER — SODIUM CHLORIDE 0.9 % IV BOLUS (SEPSIS)
1000.0000 mL | Freq: Once | INTRAVENOUS | Status: AC
  Administered 2012-04-12: 1000 mL via INTRAVENOUS

## 2012-04-12 MED ORDER — FAMOTIDINE IN NACL 20-0.9 MG/50ML-% IV SOLN
20.0000 mg | Freq: Once | INTRAVENOUS | Status: DC
  Filled 2012-04-12: qty 50

## 2012-04-12 MED ORDER — PREDNISONE 50 MG PO TABS: 50.0000 mg | ORAL_TABLET | Freq: Every day | ORAL | Status: DC

## 2012-04-12 NOTE — ED Provider Notes (Signed)
History  This chart was scribed for Richardean Canal, MD by Shari Heritage, ED Scribe. The patient was seen in room MH08/MH08. Patient's care was started at 2040.  CSN: [id number]  Arrival date & time 04/12/12  2027   First MD Initiated Contact with Patient 04/12/12 2040      Chief Complaint  Patient presents with  . Allergic Reaction     The history is provided by the patient. No language interpreter was used.    HPI Comments: Joyce Lang is a 42 y.o. female who presents to the Emergency Department complaining of pruritic, erythematous rash that began 13 hours ago after taking new medication. Patient denies SOB or difficulty swallowing. Patient has a history of MS and began taking Tecfidera today. Patient states that she took the the medication at about 7:00 AM this morning, then 30-45 minutes later, the rash developed. Patient was told that itching and rash was a common side effect of the medicines. Patient states that she had taken Benadryl without relief. Her last dose was 8-9 hours ago. Patient has a history of migraine, trigeminal neuralgia, seizures, PTSD and pseudotumor cerebri. Patient has allergies to morphine and demerol.    Past Medical History  Diagnosis Date  . Migraine   . Trigeminal neuralgia   . Molar pregnancy   . Seizures   . PTSD (post-traumatic stress disorder)   . Pseudotumor cerebri   . MS (multiple sclerosis)     Past Surgical History  Procedure Laterality Date  . Appendectomy    . Breast surgery    . Cholecystectomy    . Gastric bypass    . Abdominal hysterectomy    . Tonsillectomy    . Carpal tunnel release      History reviewed. No pertinent family history.  History  Substance Use Topics  . Smoking status: Never Smoker   . Smokeless tobacco: Never Used  . Alcohol Use: No    OB History   Grav Para Term Preterm Abortions TAB SAB Ect Mult Living                  Review of Systems  HENT: Negative for trouble swallowing.   Respiratory:  Negative for shortness of breath.   Skin: Positive for rash.  All other systems reviewed and are negative.    Allergies  Demerol and Morphine and related  Home Medications   Current Outpatient Rx  Name  Route  Sig  Dispense  Refill  . acetaZOLAMIDE (DIAMOX) 250 MG tablet   Oral   Take 375 mg by mouth 2 (two) times daily.          . busPIRone (BUSPAR) 7.5 MG tablet   Oral   Take 7.5 mg by mouth daily.           . cetirizine (ZYRTEC) 10 MG tablet   Oral   Take 10 mg by mouth daily.           . citalopram (CELEXA) 10 MG tablet   Oral   Take 10 mg by mouth daily.           . clonazePAM (KLONOPIN) 1 MG tablet   Oral   Take 1 mg by mouth daily.         Marland Kitchen gabapentin (NEURONTIN) 600 MG tablet   Oral   Take 600 mg by mouth daily.           Marland Kitchen HYDROcodone-acetaminophen (NORCO) 5-325 MG per tablet   Oral  Take 1 tablet by mouth every 6 (six) hours as needed.           . LamoTRIgine (LAMICTAL XR) 200 MG TB24   Oral   Take 1 tablet by mouth daily.          Marland Kitchen lamoTRIgine (LAMICTAL) 100 MG tablet   Oral   Take 300 mg by mouth daily.         . predniSONE (DELTASONE) 50 MG tablet   Oral   Take 1 tablet (50 mg total) by mouth daily.   5 tablet   0   . promethazine (PHENERGAN) 25 MG tablet   Oral   Take 25 mg by mouth every 6 (six) hours as needed.           . SUMAtriptan (IMITREX) 100 MG tablet   Oral   Take 100 mg by mouth every 2 (two) hours as needed.           . topiramate (TOPAMAX) 100 MG tablet   Oral   Take 100 mg by mouth 2 (two) times daily.           . valACYclovir (VALTREX) 1000 MG tablet   Oral   Take 1,000 mg by mouth daily.             Triage Vitals: BP 114/70  Pulse 108  Temp(Src) 97.7 F (36.5 C) (Oral)  Resp 20  Ht 5\' 6"  (1.676 m)  Wt 165 lb (74.844 kg)  BMI 26.64 kg/m2  SpO2 99%  Physical Exam  Constitutional: She is oriented to person, place, and time. She appears well-developed and well-nourished. No  distress.  HENT:  Head: Normocephalic and atraumatic.  Mouth/Throat: Oropharynx is clear and moist and mucous membranes are normal. Mucous membranes are not dry. No oropharyngeal exudate, posterior oropharyngeal edema or posterior oropharyngeal erythema.  Eyes: Conjunctivae and EOM are normal. Pupils are equal, round, and reactive to light.  Neck: Normal range of motion. Neck supple.  Cardiovascular: Normal rate, regular rhythm and normal heart sounds.   Pulmonary/Chest: Effort normal and breath sounds normal. No respiratory distress. She has no wheezes. She has no rales.  Musculoskeletal: Normal range of motion.  Neurological: She is alert and oriented to person, place, and time.  Skin: Skin is warm and dry. Rash noted. Rash is urticarial.  Diffuse urticaria to all extremities and torso.    ED Course  Procedures (including critical care time) DIAGNOSTIC STUDIES: Oxygen Saturation is 99% on room air, normal by my interpretation.    COORDINATION OF CARE: 8:41 PM- Patient with diffuse urticarial rash after starting new MS medicine. Will treat with IV fluids, Solu-Medrol, Pepcid and Benadryl. Patient informed of current plan for treatment and evaluation and agrees with plan at this time.   10:31 PM- Patient is significantly improved after medicines and fluids. Will prescribe steroids to treat. Patient advised to call PCP for follow up about MS med side effects.  Labs Reviewed - No data to display   No results found.   1. Urticaria   2. Allergic reaction       MDM  Joyce Lang is a 42 y.o. female here with urticaria after taking Tecfidera. I suspect that it is an allergic reaction. She took benadryl without improvement. After steroids and benadryl and pepcid, felt better. Rash improved. Will d/c home on steroids and I told her to not take the medicine until she talks to her doctor.    I personally performed the services described  in this documentation, which was scribed in my  presence. The recorded information has been reviewed and is accurate.    Richardean Canal, MD 04/12/12 [phone]

## 2012-04-12 NOTE — ED Notes (Signed)
Pt states she has a hx of MS and took her second dose of Tecfidera at 1900. At 1938 began itching and has reddened skin at present. No SHOB.

## 2012-04-22 ENCOUNTER — Other Ambulatory Visit (HOSPITAL_BASED_OUTPATIENT_CLINIC_OR_DEPARTMENT_OTHER): Payer: Self-pay | Admitting: Specialist

## 2012-04-23 ENCOUNTER — Ambulatory Visit (HOSPITAL_COMMUNITY)
Admission: RE | Admit: 2012-04-23 | Discharge: 2012-04-23 | Disposition: A | Discharge status: Home / Self Care | Payer: Federal, State, Local not specified - PPO | Source: Ambulatory Visit | Attending: Specialist | Admitting: Specialist

## 2012-04-23 DIAGNOSIS — R209 Unspecified disturbances of skin sensation: Secondary | ICD-10-CM | POA: Insufficient documentation

## 2012-04-23 DIAGNOSIS — R29898 Other symptoms and signs involving the musculoskeletal system: Secondary | ICD-10-CM | POA: Insufficient documentation

## 2012-04-23 DIAGNOSIS — E079 Disorder of thyroid, unspecified: Secondary | ICD-10-CM | POA: Insufficient documentation

## 2012-04-23 DIAGNOSIS — G35 Multiple sclerosis: Secondary | ICD-10-CM

## 2012-04-23 MED ORDER — GADOBENATE DIMEGLUMINE 529 MG/ML IV SOLN
15.0000 mL | Freq: Once | INTRAVENOUS | Status: AC | PRN
  Administered 2012-04-23: 15 mL via INTRAVENOUS

## 2012-04-25 ENCOUNTER — Ambulatory Visit (HOSPITAL_BASED_OUTPATIENT_CLINIC_OR_DEPARTMENT_OTHER): Payer: Federal, State, Local not specified - PPO

## 2012-04-25 ENCOUNTER — Ambulatory Visit (HOSPITAL_BASED_OUTPATIENT_CLINIC_OR_DEPARTMENT_OTHER): Admission: RE | Admit: 2012-04-25 | Payer: Federal, State, Local not specified - PPO | Source: Ambulatory Visit

## 2012-05-26 ENCOUNTER — Emergency Department (HOSPITAL_BASED_OUTPATIENT_CLINIC_OR_DEPARTMENT_OTHER)
Admission: EM | Admit: 2012-05-26 | Discharge: 2012-05-26 | Disposition: A | Discharge status: Home / Self Care | Payer: Federal, State, Local not specified - PPO | Attending: Emergency Medicine | Admitting: Emergency Medicine

## 2012-05-26 ENCOUNTER — Emergency Department (HOSPITAL_BASED_OUTPATIENT_CLINIC_OR_DEPARTMENT_OTHER): Payer: Federal, State, Local not specified - PPO

## 2012-05-26 ENCOUNTER — Encounter (HOSPITAL_BASED_OUTPATIENT_CLINIC_OR_DEPARTMENT_OTHER): Payer: Self-pay | Admitting: Emergency Medicine

## 2012-05-26 DIAGNOSIS — G43909 Migraine, unspecified, not intractable, without status migrainosus: Secondary | ICD-10-CM | POA: Insufficient documentation

## 2012-05-26 DIAGNOSIS — Y929 Unspecified place or not applicable: Secondary | ICD-10-CM | POA: Insufficient documentation

## 2012-05-26 DIAGNOSIS — G40909 Epilepsy, unspecified, not intractable, without status epilepticus: Secondary | ICD-10-CM | POA: Insufficient documentation

## 2012-05-26 DIAGNOSIS — Z9884 Bariatric surgery status: Secondary | ICD-10-CM | POA: Insufficient documentation

## 2012-05-26 DIAGNOSIS — G932 Benign intracranial hypertension: Secondary | ICD-10-CM | POA: Insufficient documentation

## 2012-05-26 DIAGNOSIS — Z8669 Personal history of other diseases of the nervous system and sense organs: Secondary | ICD-10-CM | POA: Insufficient documentation

## 2012-05-26 DIAGNOSIS — G35 Multiple sclerosis: Secondary | ICD-10-CM | POA: Insufficient documentation

## 2012-05-26 DIAGNOSIS — S20212A Contusion of left front wall of thorax, initial encounter: Secondary | ICD-10-CM

## 2012-05-26 DIAGNOSIS — Y93E8 Activity, other personal hygiene: Secondary | ICD-10-CM | POA: Insufficient documentation

## 2012-05-26 DIAGNOSIS — S20219A Contusion of unspecified front wall of thorax, initial encounter: Secondary | ICD-10-CM | POA: Insufficient documentation

## 2012-05-26 DIAGNOSIS — Z8659 Personal history of other mental and behavioral disorders: Secondary | ICD-10-CM | POA: Insufficient documentation

## 2012-05-26 DIAGNOSIS — W1809XA Striking against other object with subsequent fall, initial encounter: Secondary | ICD-10-CM | POA: Insufficient documentation

## 2012-05-26 NOTE — ED Provider Notes (Signed)
History     CSN: [id number]  Arrival date & time 05/26/12  2049   First MD Initiated Contact with Patient 05/26/12 2116      Chief Complaint  Patient presents with  . Fall  . Rib Injury    (Consider location/radiation/quality/duration/timing/severity/associated sxs/prior treatment) Patient is a 42 y.o. female presenting with fall. The history is provided by the patient.  Fall   Patient here complaining of left sided rib pain after falling in the shower today. No loss of consciousness. Pain is characterized as sharp and worse with movement or taking a deep breath. Most of pain is localized at the lateral eighth and ninth rib on the left. Denies any head or neck trauma. No treatment used prior to arrival. Denies any hip or back pain. Past Medical History  Diagnosis Date  . Migraine   . Trigeminal neuralgia   . Molar pregnancy   . Seizures   . PTSD (post-traumatic stress disorder)   . Pseudotumor cerebri   . MS (multiple sclerosis)     Past Surgical History  Procedure Laterality Date  . Appendectomy    . Breast surgery    . Cholecystectomy    . Gastric bypass    . Abdominal hysterectomy    . Tonsillectomy    . Carpal tunnel release      No family history on file.  History  Substance Use Topics  . Smoking status: Never Smoker   . Smokeless tobacco: Never Used  . Alcohol Use: No    OB History   Grav Para Term Preterm Abortions TAB SAB Ect Mult Living                  Review of Systems  All other systems reviewed and are negative.    Allergies  Demerol and Morphine and related  Home Medications   Current Outpatient Rx  Name  Route  Sig  Dispense  Refill  . acetaZOLAMIDE (DIAMOX) 250 MG tablet   Oral   Take 375 mg by mouth 2 (two) times daily.          . cetirizine (ZYRTEC) 10 MG tablet   Oral   Take 10 mg by mouth daily.           . clonazePAM (KLONOPIN) 1 MG tablet   Oral   Take 1 mg by mouth daily.         . Dimethyl Fumarate  (TECFIDERA) 240 MG CPDR   Oral   Take by mouth.         . gabapentin (NEURONTIN) 600 MG tablet   Oral   Take 600 mg by mouth daily.           Marland Kitchen HYDROcodone-acetaminophen (NORCO) 5-325 MG per tablet   Oral   Take 1 tablet by mouth every 6 (six) hours as needed.           . lamoTRIgine (LAMICTAL) 100 MG tablet   Oral   Take 300 mg by mouth daily.         . promethazine (PHENERGAN) 25 MG tablet   Oral   Take 25 mg by mouth every 6 (six) hours as needed.           . SUMAtriptan (IMITREX) 100 MG tablet   Oral   Take 100 mg by mouth every 2 (two) hours as needed.           . traMADol (ULTRAM) 50 MG tablet   Oral   Take 50  mg by mouth every 6 (six) hours as needed for pain.         . valACYclovir (VALTREX) 1000 MG tablet   Oral   Take 1,000 mg by mouth daily.           . busPIRone (BUSPAR) 7.5 MG tablet   Oral   Take 7.5 mg by mouth daily.           . citalopram (CELEXA) 10 MG tablet   Oral   Take 10 mg by mouth daily.           . LamoTRIgine (LAMICTAL XR) 200 MG TB24   Oral   Take 1 tablet by mouth daily.          . predniSONE (DELTASONE) 50 MG tablet   Oral   Take 1 tablet (50 mg total) by mouth daily.   5 tablet   0   . topiramate (TOPAMAX) 100 MG tablet   Oral   Take 100 mg by mouth 2 (two) times daily.             BP 112/75  Pulse 85  Temp(Src) 98.2 F (36.8 C) (Oral)  Resp 18  Ht 5\' 6"  (1.676 m)  Wt 170 lb (77.111 kg)  BMI 27.45 kg/m2  SpO2 100%  Physical Exam  Nursing note and vitals reviewed. Constitutional: She is oriented to person, place, and time. She appears well-developed and well-nourished.  Non-toxic appearance. No distress.  HENT:  Head: Normocephalic and atraumatic.  Eyes: Conjunctivae, EOM and lids are normal. Pupils are equal, round, and reactive to light.  Neck: Normal range of motion. Neck supple. No tracheal deviation present. No mass present.  Cardiovascular: Normal rate, regular rhythm and normal heart  sounds.  Exam reveals no gallop.   No murmur heard. Pulmonary/Chest: Effort normal and breath sounds normal. No stridor. No respiratory distress. She has no decreased breath sounds. She has no wheezes. She has no rhonchi. She has no rales. She exhibits tenderness and bony tenderness. She exhibits no crepitus.    Abdominal: Soft. Normal appearance and bowel sounds are normal. She exhibits no distension. There is no tenderness. There is no rebound and no CVA tenderness.  Musculoskeletal: Normal range of motion. She exhibits no edema and no tenderness.  Neurological: She is alert and oriented to person, place, and time. She has normal strength. No cranial nerve deficit or sensory deficit. GCS eye subscore is 4. GCS verbal subscore is 5. GCS motor subscore is 6.  Skin: Skin is warm and dry. No abrasion and no rash noted.  Psychiatric: She has a normal mood and affect. Her speech is normal and behavior is normal.    ED Course  Procedures (including critical care time)  Labs Reviewed - No data to display No results found.   No diagnosis found.    MDM  Rib x-rays are negative. Patient with chest contusion and she is stable for discharge        Toy Baker, MD 05/26/12 2238

## 2012-05-26 NOTE — ED Notes (Addendum)
Pt fell in shower hitting left side 1 hour PTA. Pt c/o left rib area pain with increased pain with breathing

## 2012-05-26 NOTE — ED Notes (Signed)
Patient transported to X-ray 

## 2012-06-01 ENCOUNTER — Telehealth: Payer: Self-pay | Admitting: *Deleted

## 2012-06-01 NOTE — Telephone Encounter (Signed)
Talked to Pt's mother.  She wants to come with pt to discuss her depression. Her birth control was also refilled for 30 days. PG

## 2012-06-01 NOTE — Telephone Encounter (Signed)
PT WANTS TO TALK TO DR. ZANARD ABOUT GETTING REFERRED TO A SPECIFIC. DR. Joni Fears WOULD RECOMMEND.   THANK YOU   TS

## 2012-08-20 ENCOUNTER — Encounter (HOSPITAL_BASED_OUTPATIENT_CLINIC_OR_DEPARTMENT_OTHER): Payer: Self-pay | Admitting: Student

## 2012-08-20 ENCOUNTER — Emergency Department (HOSPITAL_BASED_OUTPATIENT_CLINIC_OR_DEPARTMENT_OTHER)
Admission: EM | Admit: 2012-08-20 | Discharge: 2012-08-20 | Disposition: A | Discharge status: Home / Self Care | Payer: Federal, State, Local not specified - PPO | Attending: Emergency Medicine | Admitting: Emergency Medicine

## 2012-08-20 DIAGNOSIS — Z9884 Bariatric surgery status: Secondary | ICD-10-CM | POA: Insufficient documentation

## 2012-08-20 DIAGNOSIS — G35 Multiple sclerosis: Secondary | ICD-10-CM | POA: Insufficient documentation

## 2012-08-20 DIAGNOSIS — G500 Trigeminal neuralgia: Secondary | ICD-10-CM | POA: Insufficient documentation

## 2012-08-20 DIAGNOSIS — R110 Nausea: Secondary | ICD-10-CM | POA: Insufficient documentation

## 2012-08-20 DIAGNOSIS — Z79899 Other long term (current) drug therapy: Secondary | ICD-10-CM | POA: Insufficient documentation

## 2012-08-20 DIAGNOSIS — H53149 Visual discomfort, unspecified: Secondary | ICD-10-CM | POA: Insufficient documentation

## 2012-08-20 DIAGNOSIS — G40909 Epilepsy, unspecified, not intractable, without status epilepticus: Secondary | ICD-10-CM | POA: Insufficient documentation

## 2012-08-20 DIAGNOSIS — F4310 Post-traumatic stress disorder, unspecified: Secondary | ICD-10-CM | POA: Insufficient documentation

## 2012-08-20 DIAGNOSIS — IMO0002 Reserved for concepts with insufficient information to code with codable children: Secondary | ICD-10-CM | POA: Insufficient documentation

## 2012-08-20 DIAGNOSIS — Z87828 Personal history of other (healed) physical injury and trauma: Secondary | ICD-10-CM | POA: Insufficient documentation

## 2012-08-20 DIAGNOSIS — M549 Dorsalgia, unspecified: Secondary | ICD-10-CM | POA: Insufficient documentation

## 2012-08-20 DIAGNOSIS — G43909 Migraine, unspecified, not intractable, without status migrainosus: Secondary | ICD-10-CM

## 2012-08-20 DIAGNOSIS — Z8669 Personal history of other diseases of the nervous system and sense organs: Secondary | ICD-10-CM | POA: Insufficient documentation

## 2012-08-20 MED ORDER — DIPHENHYDRAMINE HCL 50 MG/ML IJ SOLN
50.0000 mg | Freq: Once | INTRAMUSCULAR | Status: AC
  Administered 2012-08-20: 50 mg via INTRAMUSCULAR
  Filled 2012-08-20: qty 1

## 2012-08-20 MED ORDER — DEXAMETHASONE SODIUM PHOSPHATE 10 MG/ML IJ SOLN
10.0000 mg | Freq: Once | INTRAMUSCULAR | Status: AC
  Administered 2012-08-20: 10 mg via INTRAMUSCULAR
  Filled 2012-08-20: qty 1

## 2012-08-20 MED ORDER — METOCLOPRAMIDE HCL 5 MG/ML IJ SOLN
10.0000 mg | Freq: Once | INTRAMUSCULAR | Status: AC
  Administered 2012-08-20: 10 mg via INTRAMUSCULAR
  Filled 2012-08-20: qty 2

## 2012-08-20 NOTE — Discharge Instructions (Signed)
Recurrent Migraine Headache  A migraine headache is an intense, throbbing pain on one or both sides of your head. Recurrent migraines keep coming back. A migraine can last for 30 minutes to several hours.  CAUSES   The exact cause of a migraine headache is not always known. However, a migraine may be caused when nerves in the brain become irritated and release chemicals that cause inflammation. This causes pain.   SYMPTOMS    Pain on one or both sides of your head.   Pulsating or throbbing pain.   Severe pain that prevents daily activities.   Pain that is aggravated by any physical activity.   Nausea, vomiting, or both.   Dizziness.   Pain with exposure to bright lights, loud noises, or activity.   General sensitivity to bright lights, loud noises, or smells.  Before you get a migraine, you may get warning signs that a migraine is coming (aura). An aura may include:   Seeing flashing lights.   Seeing bright spots, halos, or zig-zag lines.   Having tunnel vision or blurred vision.   Having feelings of numbness or tingling.   Having trouble talking.   Having muscle weakness.  MIGRAINE TRIGGERS  Examples of triggers of migraine headaches include:    Alcohol.   Smoking.   Stress.   Menstruation.   Aged cheeses.   Foods or drinks that contain nitrates, glutamate, aspartame, or tyramine.   Lack of sleep.   Chocolate.   Caffeine.   Hunger.   Physical exertion.   Fatigue.   Medicines used to treat chest pain (nitroglycerine), birth control pills, estrogen, and some blood pressure medicines.  DIAGNOSIS   A recurrent migraine headache is often diagnosed based on:   Symptoms.   Physical examination.   A CT scan or MRI of your head.  TREATMENT   Medicines may be given for pain and nausea. Medicines can also be given to help prevent recurrent migraines.  HOME CARE INSTRUCTIONS   Only take over-the-counter or prescription medicines for pain or discomfort as directed by your caregiver. The use of  long-term narcotics is not recommended.   Lie down in a dark, quiet room when you have a migraine.   Keep a journal to find out what may trigger your migraine headaches. For example, write down:   What you eat and drink.   How much sleep you get.   Any change to your diet or medicines.   Limit alcohol consumption.   Quit smoking if you smoke.   Get 7 to 9 hours of sleep, or as recommended by your caregiver.   Limit stress.   Keep lights dim if bright lights bother you and make your migraines worse.  SEEK MEDICAL CARE IF:    You do not get relief from the medicines given to you.   You have a recurrence of pain.  SEEK IMMEDIATE MEDICAL CARE IF:   Your migraine becomes severe.   You have a fever.   You have a stiff neck.   You have loss of vision.   You have muscular weakness or loss of muscle control.   You start losing your balance or have trouble walking.   You feel faint or pass out.   You have severe symptoms that are different from your first symptoms.  MAKE SURE YOU:    Understand these instructions.   Will watch your condition.   Will get help right away if you are not doing well or get worse.    Document Released: 11/13/2000 Document Revised: 05/13/2011 Document Reviewed: 02/08/2011  [employer] Patient Information 2014 [employer]

## 2012-08-20 NOTE — ED Provider Notes (Signed)
History     CSN: [id number]  Arrival date & time 08/20/12  1006   First MD Initiated Contact with Patient 08/20/12 1018      Chief Complaint  Patient presents with  . Migraine    (Consider location/radiation/quality/duration/timing/severity/associated sxs/prior treatment) HPI Comments: Pt takes klonopin for prevention and to help with migraines in addition to imitrex.  Took last dose 3 days ago with only partial improvement before HA returns. Has had waxing and intermittent recurring nature since falling and hitting head in March.  She called and has an appt with her neurologist tomorrow, but wanted to try meds in the ED prior to seeing neurologist because she thinks he will do an LP.  No fevers, no stiff neck, no rash.  Feels like prior HA's, although somewhat worse and lingering nature is different.    Patient is a 42 y.o. female presenting with migraines. The history is provided by the patient.  Migraine This is a recurrent problem. The current episode started more than 1 week ago. The problem occurs constantly. The problem has not changed since onset.Associated symptoms include headaches. Pertinent negatives include no chest pain, no abdominal pain and no shortness of breath. Nothing relieves the symptoms.    Past Medical History  Diagnosis Date  . Migraine   . Trigeminal neuralgia   . Molar pregnancy   . Seizures   . PTSD (post-traumatic stress disorder)   . Pseudotumor cerebri   . MS (multiple sclerosis)     Past Surgical History  Procedure Laterality Date  . Appendectomy    . Breast surgery    . Cholecystectomy    . Gastric bypass    . Abdominal hysterectomy    . Tonsillectomy    . Carpal tunnel release      History reviewed. No pertinent family history.  History  Substance Use Topics  . Smoking status: Never Smoker   . Smokeless tobacco: Never Used  . Alcohol Use: No    OB History   Grav Para Term Preterm Abortions TAB SAB Ect Mult Living                   Review of Systems  Constitutional: Negative for fever and chills.  Eyes: Positive for photophobia.  Respiratory: Negative for shortness of breath.   Cardiovascular: Negative for chest pain.  Gastrointestinal: Positive for nausea. Negative for abdominal pain.  Musculoskeletal: Positive for back pain.  Skin: Negative for rash.  Neurological: Positive for headaches.  All other systems reviewed and are negative.    Allergies  Demerol and Morphine and related  Home Medications   Current Outpatient Rx  Name  Route  Sig  Dispense  Refill  . tapentadol (NUCYNTA) 50 MG TABS   Oral   Take 75 mg by mouth every 6 (six) hours as needed.         Marland Kitchen acetaZOLAMIDE (DIAMOX) 250 MG tablet   Oral   Take 375 mg by mouth 2 (two) times daily.          . cetirizine (ZYRTEC) 10 MG tablet   Oral   Take 10 mg by mouth daily.           . clonazePAM (KLONOPIN) 1 MG tablet   Oral   Take 1 mg by mouth daily.         . Dimethyl Fumarate (TECFIDERA) 240 MG CPDR   Oral   Take by mouth.         . gabapentin (NEURONTIN)  600 MG tablet   Oral   Take 600 mg by mouth daily.           . LamoTRIgine (LAMICTAL XR) 200 MG TB24   Oral   Take 1 tablet by mouth daily.          Marland Kitchen lamoTRIgine (LAMICTAL) 100 MG tablet   Oral   Take 300 mg by mouth daily.         . predniSONE (DELTASONE) 50 MG tablet   Oral   Take 1 tablet (50 mg total) by mouth daily.   5 tablet   0   . promethazine (PHENERGAN) 25 MG tablet   Oral   Take 25 mg by mouth every 6 (six) hours as needed.           . SUMAtriptan (IMITREX) 100 MG tablet   Oral   Take 100 mg by mouth every 2 (two) hours as needed.           . valACYclovir (VALTREX) 1000 MG tablet   Oral   Take 1,000 mg by mouth daily.             BP 109/77  Pulse 78  Temp(Src) 98.2 F (36.8 C) (Oral)  Resp 20  Wt 178 lb (80.74 kg)  BMI 28.74 kg/m2  SpO2 100%  Physical Exam  Nursing note and vitals reviewed. Constitutional: She is  oriented to person, place, and time. She appears well-developed and well-nourished. No distress.  HENT:  Head: Normocephalic and atraumatic.  Eyes: Conjunctivae and EOM are normal.  Neck: Normal range of motion. Neck supple.  Cardiovascular: Normal rate, regular rhythm and intact distal pulses.   Pulmonary/Chest: Effort normal. No respiratory distress.  Abdominal: Soft.  Neurological: She is alert and oriented to person, place, and time. No cranial nerve deficit. She exhibits normal muscle tone. Coordination normal.  Skin: Skin is warm and dry. No rash noted. She is not diaphoretic.  Psychiatric: She has a normal mood and affect.    ED Course  Procedures (including critical care time)  Labs Reviewed - No data to display No results found.   1. Migraine     ra sat is 100% and I interpret to be normal  MDM  I reviewed prior records, pt has been treated successfully with migraine cocktail including benadryl, reglan, decadron.  Pt is driving home herself and feels ok receiving IM injections and going home and resting at home.  I feel ok with this as pt has no focal deficits, has been chronic and has good follow up tomorrow.  Pt knows she can always return in the immediate time frame if worse, but encouraged to keep her follow up tomorrow.        Gavin Pound. Shubham Thackston, MD 08/20/12 1035

## 2012-08-20 NOTE — ED Notes (Signed)
Pt in with c/o MHA x 3 weeks and reports headache based in back of head and behind left eye. Intermittent in nature since head injury 3 weeks ago. PERRLA. + N .

## 2012-09-03 ENCOUNTER — Encounter: Payer: Self-pay | Admitting: *Deleted

## 2012-10-28 ENCOUNTER — Emergency Department (HOSPITAL_BASED_OUTPATIENT_CLINIC_OR_DEPARTMENT_OTHER)
Admission: EM | Admit: 2012-10-28 | Discharge: 2012-10-28 | Disposition: A | Discharge status: Home / Self Care | Payer: Federal, State, Local not specified - PPO | Attending: Emergency Medicine | Admitting: Emergency Medicine

## 2012-10-28 ENCOUNTER — Encounter (HOSPITAL_BASED_OUTPATIENT_CLINIC_OR_DEPARTMENT_OTHER): Payer: Self-pay

## 2012-10-28 ENCOUNTER — Emergency Department (HOSPITAL_BASED_OUTPATIENT_CLINIC_OR_DEPARTMENT_OTHER): Payer: Federal, State, Local not specified - PPO

## 2012-10-28 DIAGNOSIS — G40909 Epilepsy, unspecified, not intractable, without status epilepticus: Secondary | ICD-10-CM | POA: Insufficient documentation

## 2012-10-28 DIAGNOSIS — S0590XA Unspecified injury of unspecified eye and orbit, initial encounter: Secondary | ICD-10-CM | POA: Insufficient documentation

## 2012-10-28 DIAGNOSIS — Z8659 Personal history of other mental and behavioral disorders: Secondary | ICD-10-CM | POA: Insufficient documentation

## 2012-10-28 DIAGNOSIS — G35 Multiple sclerosis: Secondary | ICD-10-CM | POA: Insufficient documentation

## 2012-10-28 DIAGNOSIS — W2209XA Striking against other stationary object, initial encounter: Secondary | ICD-10-CM | POA: Insufficient documentation

## 2012-10-28 DIAGNOSIS — H538 Other visual disturbances: Secondary | ICD-10-CM | POA: Insufficient documentation

## 2012-10-28 DIAGNOSIS — G43909 Migraine, unspecified, not intractable, without status migrainosus: Secondary | ICD-10-CM | POA: Insufficient documentation

## 2012-10-28 DIAGNOSIS — Y939 Activity, unspecified: Secondary | ICD-10-CM | POA: Insufficient documentation

## 2012-10-28 DIAGNOSIS — Z79899 Other long term (current) drug therapy: Secondary | ICD-10-CM | POA: Insufficient documentation

## 2012-10-28 DIAGNOSIS — Z8669 Personal history of other diseases of the nervous system and sense organs: Secondary | ICD-10-CM | POA: Insufficient documentation

## 2012-10-28 DIAGNOSIS — S060X0A Concussion without loss of consciousness, initial encounter: Secondary | ICD-10-CM | POA: Insufficient documentation

## 2012-10-28 DIAGNOSIS — R42 Dizziness and giddiness: Secondary | ICD-10-CM | POA: Insufficient documentation

## 2012-10-28 DIAGNOSIS — Y929 Unspecified place or not applicable: Secondary | ICD-10-CM | POA: Insufficient documentation

## 2012-10-28 DIAGNOSIS — S0993XA Unspecified injury of face, initial encounter: Secondary | ICD-10-CM | POA: Insufficient documentation

## 2012-10-28 NOTE — ED Provider Notes (Signed)
CSN: [id number]     Arrival date & time 10/28/12  1123 History   First MD Initiated Contact with Patient 10/28/12 1140     Chief Complaint  Patient presents with  . Head Injury  . Eye Problem   (Consider location/radiation/quality/duration/timing/severity/associated sxs/prior Treatment) Patient is a 42 y.o. female presenting with head injury and eye problem. The history is provided by the patient.  Head Injury Location:  Frontal Time since incident:  12 hours Mechanism of injury: direct blow   Mechanism of injury comment:  Hit her head on the corner of the dresser last night Pain details:    Quality:  Pressure, aching and throbbing   Radiates to: none.   Severity:  Moderate   Timing:  Constant   Progression:  Unchanged Chronicity:  New Relieved by:  Nothing Associated symptoms: blurred vision, headache and neck pain   Associated symptoms: no difficulty breathing, no disorientation, no double vision, no focal weakness, no hearing loss, no loss of consciousness, no nausea and no vomiting   Associated symptoms comment:  Mild blurred vision in the left eye since hitting her head Eye Problem Associated symptoms: blurred vision and headaches   Associated symptoms: no double vision, no nausea, no vomiting and no weakness     Past Medical History  Diagnosis Date  . Migraine   . Trigeminal neuralgia   . Molar pregnancy   . Seizures   . PTSD (post-traumatic stress disorder)   . Pseudotumor cerebri   . MS (multiple sclerosis)    Past Surgical History  Procedure Laterality Date  . Appendectomy    . Breast surgery    . Cholecystectomy    . Gastric bypass    . Abdominal hysterectomy    . Tonsillectomy    . Carpal tunnel release     No family history on file. History  Substance Use Topics  . Smoking status: Never Smoker   . Smokeless tobacco: Never Used  . Alcohol Use: No   OB History   Grav Para Term Preterm Abortions TAB SAB Ect Mult Living                 Review of  Systems  Constitutional: Negative for fever.  HENT: Positive for neck pain. Negative for hearing loss.   Eyes: Positive for blurred vision. Negative for double vision.  Gastrointestinal: Negative for nausea and vomiting.  Neurological: Positive for light-headedness and headaches. Negative for focal weakness, loss of consciousness and weakness.       Chronic sensory issues  All other systems reviewed and are negative.    Allergies  Demerol and Morphine and related  Home Medications   Current Outpatient Rx  Name  Route  Sig  Dispense  Refill  . carisoprodol (SOMA) 350 MG tablet   Oral   Take 350 mg by mouth 4 (four) times daily as needed for muscle spasms.         Marland Kitchen acetaZOLAMIDE (DIAMOX) 250 MG tablet   Oral   Take 375 mg by mouth 2 (two) times daily.          . cetirizine (ZYRTEC) 10 MG tablet   Oral   Take 10 mg by mouth daily.           . clonazePAM (KLONOPIN) 1 MG tablet   Oral   Take 1 mg by mouth daily.         . Dimethyl Fumarate (TECFIDERA) 240 MG CPDR   Oral   Take by mouth.         Marland Kitchen  gabapentin (NEURONTIN) 600 MG tablet   Oral   Take 600 mg by mouth daily.           . LamoTRIgine (LAMICTAL XR) 200 MG TB24   Oral   Take 1 tablet by mouth daily.          Marland Kitchen lamoTRIgine (LAMICTAL) 100 MG tablet   Oral   Take 300 mg by mouth daily.         . predniSONE (DELTASONE) 50 MG tablet   Oral   Take 1 tablet (50 mg total) by mouth daily.   5 tablet   0   . promethazine (PHENERGAN) 25 MG tablet   Oral   Take 25 mg by mouth every 6 (six) hours as needed.           . SUMAtriptan (IMITREX) 100 MG tablet   Oral   Take 100 mg by mouth every 2 (two) hours as needed.           . tapentadol (NUCYNTA) 50 MG TABS   Oral   Take 75 mg by mouth every 6 (six) hours as needed.         . valACYclovir (VALTREX) 1000 MG tablet   Oral   Take 1,000 mg by mouth daily.            BP 102/78  Pulse 84  Temp(Src) 98.1 F (36.7 C) (Oral)  Resp 20   Ht 5\' 6"  (1.676 m)  Wt 185 lb (83.915 kg)  BMI 29.87 kg/m2  SpO2 100% Physical Exam  Nursing note and vitals reviewed. Constitutional: She is oriented to person, place, and time. She appears well-developed and well-nourished. No distress.  HENT:  Head: Normocephalic and atraumatic.  Mouth/Throat: Oropharynx is clear and moist.  Pain over the left forehead where she injured herself but no ecchymosis or swelling  Eyes: Conjunctivae and EOM are normal. Pupils are equal, round, and reactive to light. Right eye exhibits no discharge. Left eye exhibits no discharge.  photophobia  Neck: Normal range of motion. Neck supple. No spinous process tenderness present. No rigidity. No Brudzinski's sign and no Kernig's sign noted.  Cardiovascular: Normal rate, normal heart sounds and intact distal pulses.   No murmur heard. Pulmonary/Chest: Effort normal and breath sounds normal. No respiratory distress. She has no wheezes. She has no rales.  Abdominal: Soft. She exhibits no distension. There is no tenderness.  Musculoskeletal: Normal range of motion. She exhibits no edema and no tenderness.  Lymphadenopathy:    She has no cervical adenopathy.  Neurological: She is alert and oriented to person, place, and time. She has normal strength. No cranial nerve deficit or sensory deficit. Coordination and gait normal. GCS eye subscore is 4. GCS verbal subscore is 5. GCS motor subscore is 6.  Skin: Skin is warm and dry.  Psychiatric: She has a normal mood and affect. Her behavior is normal.    ED Course  Procedures (including critical care time) Labs Review Labs Reviewed - No data to display Imaging Review Ct Head Wo Contrast  10/28/2012   *RADIOLOGY REPORT*  Clinical Data:  Blow to the head.  Dizziness and blurred vision. Posterior neck pain.  CT HEAD WITHOUT CONTRAST CT CERVICAL SPINE WITHOUT CONTRAST  Technique:  Multidetector CT imaging of the head and cervical spine was performed following the standard  protocol without intravenous contrast.  Multiplanar CT image reconstructions of the cervical spine were also generated.  Comparison:  Head CT scan 05/24/2011 and brain MRI 04/23/2012.  CT HEAD  Findings: No evidence of acute intracranial abnormality including infarction, hemorrhage, mass lesion, mass effect, midline shift or abnormal extra-axial fluid collection is identified.  Foci of white matter signal abnormality seen on brain MRI are not well demonstrated on CT scan.  The calvarium is intact.  There is no pneumocephalus or hydrocephalus.  IMPRESSION: No acute finding.  CT CERVICAL SPINE  Findings: The neck is in mild flexion.  There is no fracture or subluxation of the cervical spine.  Lung apices are clear. Paraspinous soft tissue structures appear normal.  IMPRESSION: No acute finding.   Original Report Authenticated By: Holley Dexter, M.D.   Ct Cervical Spine Wo Contrast  10/28/2012   *RADIOLOGY REPORT*  Clinical Data:  Blow to the head.  Dizziness and blurred vision. Posterior neck pain.  CT HEAD WITHOUT CONTRAST CT CERVICAL SPINE WITHOUT CONTRAST  Technique:  Multidetector CT imaging of the head and cervical spine was performed following the standard protocol without intravenous contrast.  Multiplanar CT image reconstructions of the cervical spine were also generated.  Comparison:  Head CT scan 05/24/2011 and brain MRI 04/23/2012.  CT HEAD  Findings: No evidence of acute intracranial abnormality including infarction, hemorrhage, mass lesion, mass effect, midline shift or abnormal extra-axial fluid collection is identified.  Foci of white matter signal abnormality seen on brain MRI are not well demonstrated on CT scan.  The calvarium is intact.  There is no pneumocephalus or hydrocephalus.  IMPRESSION: No acute finding.  CT CERVICAL SPINE  Findings: The neck is in mild flexion.  There is no fracture or subluxation of the cervical spine.  Lung apices are clear. Paraspinous soft tissue structures appear  normal.  IMPRESSION: No acute finding.   Original Report Authenticated By: Holley Dexter, M.D.    MDM   1. Concussion, without loss of consciousness, initial encounter     Patient with an injury to her head last night address her without LOC persistent dizziness and left visual change. She states her stress mild left visual blurring which is different from baseline. Patient has a dull headache but states it is not compared to a migraine, trigeminal neuralgia or pseudotumor. At this point she does not feel that she needs any medications for the pain.  Normal neuro exam with reactive pupils. Normal gait is able to move upper and lower extremities without difficulty. Mild C2 to 3 spine tenderness.  CT of head and neck pending.  12:17 PM Films are neg and visual acuity 20/30 bilaterally.  D/ced home with dx of concussion.  Gwyneth Sprout, MD 10/28/12 1217

## 2012-10-28 NOTE — ED Notes (Signed)
MD at bedside. 

## 2012-10-28 NOTE — ED Notes (Signed)
Patient transported to CT ambulatory with tech. 

## 2012-10-28 NOTE — ED Notes (Signed)
Pt reports last night bent over to pick something up and hit head on edge of dresser.  With initial dizziness and blurred vision on left side.  Today still with vision problems and ha.

## 2012-10-28 NOTE — ED Notes (Signed)
Pt back from ct

## 2012-12-08 ENCOUNTER — Ambulatory Visit (INDEPENDENT_AMBULATORY_CARE_PROVIDER_SITE_OTHER): Payer: Federal, State, Local not specified - PPO | Admitting: *Deleted

## 2012-12-08 DIAGNOSIS — Z23 Encounter for immunization: Secondary | ICD-10-CM

## 2012-12-22 ENCOUNTER — Ambulatory Visit: Payer: Federal, State, Local not specified - PPO | Admitting: Internal Medicine

## 2013-02-09 ENCOUNTER — Encounter (HOSPITAL_BASED_OUTPATIENT_CLINIC_OR_DEPARTMENT_OTHER): Payer: Self-pay | Admitting: Emergency Medicine

## 2013-02-09 ENCOUNTER — Emergency Department (HOSPITAL_BASED_OUTPATIENT_CLINIC_OR_DEPARTMENT_OTHER)
Admission: EM | Admit: 2013-02-09 | Discharge: 2013-02-09 | Disposition: A | Discharge status: Home / Self Care | Payer: Federal, State, Local not specified - PPO | Attending: Emergency Medicine | Admitting: Emergency Medicine

## 2013-02-09 DIAGNOSIS — Z79899 Other long term (current) drug therapy: Secondary | ICD-10-CM | POA: Insufficient documentation

## 2013-02-09 DIAGNOSIS — Z9884 Bariatric surgery status: Secondary | ICD-10-CM | POA: Insufficient documentation

## 2013-02-09 DIAGNOSIS — N939 Abnormal uterine and vaginal bleeding, unspecified: Secondary | ICD-10-CM

## 2013-02-09 DIAGNOSIS — Z90710 Acquired absence of both cervix and uterus: Secondary | ICD-10-CM

## 2013-02-09 DIAGNOSIS — Z8742 Personal history of other diseases of the female genital tract: Secondary | ICD-10-CM | POA: Insufficient documentation

## 2013-02-09 DIAGNOSIS — G35 Multiple sclerosis: Secondary | ICD-10-CM | POA: Insufficient documentation

## 2013-02-09 DIAGNOSIS — IMO0002 Reserved for concepts with insufficient information to code with codable children: Secondary | ICD-10-CM | POA: Insufficient documentation

## 2013-02-09 DIAGNOSIS — G43909 Migraine, unspecified, not intractable, without status migrainosus: Secondary | ICD-10-CM | POA: Insufficient documentation

## 2013-02-09 DIAGNOSIS — F4310 Post-traumatic stress disorder, unspecified: Secondary | ICD-10-CM | POA: Insufficient documentation

## 2013-02-09 DIAGNOSIS — G40909 Epilepsy, unspecified, not intractable, without status epilepticus: Secondary | ICD-10-CM | POA: Insufficient documentation

## 2013-02-09 DIAGNOSIS — N898 Other specified noninflammatory disorders of vagina: Secondary | ICD-10-CM | POA: Insufficient documentation

## 2013-02-09 LAB — WET PREP, GENITAL
Trich, Wet Prep: NONE SEEN
Yeast Wet Prep HPF POC: NONE SEEN

## 2013-02-09 NOTE — ED Notes (Signed)
Vaginal bleeding. Hx of hysterectomy 2 years ago.

## 2013-02-09 NOTE — ED Provider Notes (Signed)
CSN: [id number]     Arrival date & time 02/09/13  2110 History   First MD Initiated Contact with Patient 02/09/13 2153     This chart was scribed for Joyce Lyons, MD by Manuela Schwartz, ED scribe. This patient was seen in room MH04/MH04 and the patient's care was started at 2153.  Chief Complaint  Patient presents with  . Vaginal Bleeding   Patient is a 42 y.o. female presenting with vaginal bleeding. The history is provided by the patient. No language interpreter was used.  Vaginal Bleeding Quality:  Bright red Severity:  Mild Onset quality:  Sudden Duration:  2 hours Progression:  Unchanged Chronicity:  New Relieved by:  Nothing Worsened by:  Nothing tried Ineffective treatments:  None tried Associated symptoms: abdominal pain   Associated symptoms: no back pain, no dizziness, no dysuria, no fatigue, no fever and no nausea    HPI Comments: Joyce Lang is a 42 y.o. female who presents to the Emergency Department complaining of vaginal bleeding, onset this PM and reports that she had a hysterectomy 2 years ago. She states that tonight while in bathroom having a BM, she noticed her vaginal bleeding (bright red blood) and states she also noticed some clumps of tissue. She reports her hysterectomy performed only from vagina (no abdominal incisions) was w/out any complications and that she has not had any problems since then. She states some associated mild lower abdominal pain.   Past Medical History  Diagnosis Date  . Migraine   . Trigeminal neuralgia   . Molar pregnancy   . Seizures   . PTSD (post-traumatic stress disorder)   . Pseudotumor cerebri   . MS (multiple sclerosis)    Past Surgical History  Procedure Laterality Date  . Appendectomy    . Breast surgery    . Cholecystectomy    . Gastric bypass    . Abdominal hysterectomy    . Tonsillectomy    . Carpal tunnel release     No family history on file. History  Substance Use Topics  . Smoking status: Never Smoker   .  Smokeless tobacco: Never Used  . Alcohol Use: No   OB History   Grav Para Term Preterm Abortions TAB SAB Ect Mult Living                 Review of Systems  Constitutional: Negative for fever, chills and fatigue.  HENT: Negative for congestion and rhinorrhea.   Respiratory: Negative for cough and shortness of breath.   Cardiovascular: Negative for chest pain.  Gastrointestinal: Positive for abdominal pain. Negative for nausea, vomiting and diarrhea.  Genitourinary: Positive for vaginal bleeding. Negative for dysuria.  Musculoskeletal: Negative for back pain.  Skin: Negative for color change and rash.  Neurological: Negative for dizziness and syncope.  All other systems reviewed and are negative.   A complete 10 system review of systems was obtained and all systems are negative except as noted in the HPI and PMH.   Allergies  Demerol and Morphine and related  Home Medications   Current Outpatient Rx  Name  Route  Sig  Dispense  Refill  . cyclobenzaprine (FLEXERIL) 10 MG tablet   Oral   Take 10 mg by mouth 3 (three) times daily as needed for muscle spasms.         Marland Kitchen acetaZOLAMIDE (DIAMOX) 250 MG tablet   Oral   Take 375 mg by mouth 2 (two) times daily.          Marland Kitchen  carisoprodol (SOMA) 350 MG tablet   Oral   Take 350 mg by mouth 4 (four) times daily as needed for muscle spasms.         . cetirizine (ZYRTEC) 10 MG tablet   Oral   Take 10 mg by mouth daily.           . clonazePAM (KLONOPIN) 1 MG tablet   Oral   Take 1 mg by mouth daily.         . Dimethyl Fumarate (TECFIDERA) 240 MG CPDR   Oral   Take by mouth.         . gabapentin (NEURONTIN) 600 MG tablet   Oral   Take 600 mg by mouth daily.           . LamoTRIgine (LAMICTAL XR) 200 MG TB24   Oral   Take 1 tablet by mouth daily.          Marland Kitchen lamoTRIgine (LAMICTAL) 100 MG tablet   Oral   Take 300 mg by mouth daily.         . predniSONE (DELTASONE) 50 MG tablet   Oral   Take 1 tablet (50 mg  total) by mouth daily.   5 tablet   0   . promethazine (PHENERGAN) 25 MG tablet   Oral   Take 25 mg by mouth every 6 (six) hours as needed.           . SUMAtriptan (IMITREX) 100 MG tablet   Oral   Take 100 mg by mouth every 2 (two) hours as needed.           . tapentadol (NUCYNTA) 50 MG TABS   Oral   Take 75 mg by mouth every 6 (six) hours as needed.         . valACYclovir (VALTREX) 1000 MG tablet   Oral   Take 1,000 mg by mouth daily.            Triage Vitals: BP 121/73  Pulse 118  Temp(Src) 98 F (36.7 C) (Oral)  Resp 18  Ht 5\' 6"  (1.676 m)  Wt 185 lb (83.915 kg)  BMI 29.87 kg/m2  SpO2 100% Physical Exam  Nursing note and vitals reviewed. Constitutional: She is oriented to person, place, and time. She appears well-developed and well-nourished. No distress.  HENT:  Head: Normocephalic and atraumatic.  Eyes: Conjunctivae are normal. Right eye exhibits no discharge. Left eye exhibits no discharge.  Neck: Normal range of motion. No tracheal deviation present.  Cardiovascular: Normal rate.   Pulmonary/Chest: Effort normal. No respiratory distress.  Genitourinary: Vagina normal. No vaginal discharge found.  There is no blood in the vaginal vault and there are no lesions.  Musculoskeletal: Normal range of motion. She exhibits no edema.  Neurological: She is alert and oriented to person, place, and time.  Skin: Skin is warm and dry.  Psychiatric: She has a normal mood and affect. Thought content normal.    ED Course  Procedures (including critical care time) DIAGNOSTIC STUDIES: Oxygen Saturation is 100% on room air, normal by my interpretation.    COORDINATION OF CARE: At 950 PM Discussed treatment plan with patient which includes pelvic exam. Patient agrees.   Labs Review Labs Reviewed - No data to display Imaging Review No results found.    MDM  No diagnosis found. Patient is a 42 year old female who is 2-1/2 years status post hysterectomy. She  presents today with complaints of bright red blood from the vagina which occurred  this evening. She denies any pain and denies any injury. Physical exam reveals a benign abdomen and pelvic examination reveals no evidence for blood in the vaginal vault and no evidence for any lesions. I am unsure as to where the bleeding originated however she is clearly not actively bleeding and her vital signs are stable. Wet prep reveals essentially no abnormalities. I feel as though she is stable for discharge. If this bleeding resumes I advised her to followup with her GYN to discuss.   I personally performed the services described in this documentation, which was scribed in my presence. The recorded information has been reviewed and is accurate.        Joyce Lyons, MD 02/09/13 2249

## 2013-02-09 NOTE — ED Notes (Signed)
Pelvic cart is at the door of the patient's room.

## 2013-02-10 LAB — GC/CHLAMYDIA PROBE AMP: CT Probe RNA: NEGATIVE

## 2013-02-19 ENCOUNTER — Ambulatory Visit (INDEPENDENT_AMBULATORY_CARE_PROVIDER_SITE_OTHER): Payer: Federal, State, Local not specified - PPO | Admitting: Family Medicine

## 2013-02-19 ENCOUNTER — Encounter: Payer: Self-pay | Admitting: Family Medicine

## 2013-02-19 VITALS — BP 101/72 | HR 92 | Resp 16

## 2013-02-19 DIAGNOSIS — R059 Cough, unspecified: Secondary | ICD-10-CM

## 2013-02-19 DIAGNOSIS — J069 Acute upper respiratory infection, unspecified: Secondary | ICD-10-CM

## 2013-02-19 DIAGNOSIS — R05 Cough: Secondary | ICD-10-CM

## 2013-02-19 MED ORDER — BENZONATATE 200 MG PO CAPS
200.0000 mg | ORAL_CAPSULE | Freq: Three times a day (TID) | ORAL | Status: DC | PRN
Start: 2013-02-19 — End: 2013-02-19

## 2013-02-19 MED ORDER — METHYLPREDNISOLONE SODIUM SUCC 125 MG IJ SOLR
125.0000 mg | Freq: Once | INTRAMUSCULAR | Status: AC
  Administered 2013-02-19: 125 mg via INTRAMUSCULAR

## 2013-02-19 MED ORDER — BENZONATATE 200 MG PO CAPS: 200.0000 mg | ORAL_CAPSULE | Freq: Three times a day (TID) | ORAL | Status: DC | PRN

## 2013-02-19 MED ORDER — AZITHROMYCIN 500 MG PO TABS: 500.0000 mg | ORAL_TABLET | Freq: Every day | ORAL | Status: AC

## 2013-02-19 NOTE — Progress Notes (Signed)
Subjective:    Patient ID: Joyce Lang, female    DOB: 22-Sep-1970, 42 y.o.   MRN: [id number]  HPI  Eugene is here today complaining of URI symptoms.  She has been sick off and on for almost two months.  She has been seen at [facility] and was given a round of amoxicillin, Tussionex and Flonase which helped her some but her symptoms have returned.  She is currently taking Delsym which has helped her cough some.     Review of Systems  Constitutional: Positive for fatigue.  HENT: Positive for nosebleeds.   Respiratory: Positive for cough and shortness of breath.        Chest pain while coughing.    Neurological: Positive for headaches.     Past Medical History  Diagnosis Date  . Migraine   . Trigeminal neuralgia   . Molar pregnancy   . Seizures   . PTSD (post-traumatic stress disorder)   . Pseudotumor cerebri   . MS (multiple sclerosis)      Past Surgical History  Procedure Laterality Date  . Appendectomy    . Breast surgery    . Cholecystectomy    . Gastric bypass    . Abdominal hysterectomy    . Tonsillectomy    . Carpal tunnel release       History   Social History Narrative   Marital Status:  Divorced  Casimiro Needle)    Children:  Daughter Danley Danker) Son Danton Clap - Suicide 11/13)    Pets: Cat (Max)   Living Situation: Lives with daughter.     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  She was enrolled (full-time) in school at [employer].  She is studying Surveyor, minerals.  She has just decided to take a leave of absence.     Tobacco Use/Exposure:  None    Alcohol Use:  Occasional   Drug Use:  None   Diet:  Regular   Exercise:  Gym 4-5 week (Treadmill & Weights)     Hobbies: Reading, Roller Skating, Dancing     Family History  Problem Relation Age of Onset  . Cancer Maternal Aunt     Colon Cancer   . Alcohol abuse Maternal Uncle   . Cancer Paternal Grandfather     Colon Cancer     Current  Outpatient Prescriptions on File Prior to Visit  Medication Sig Dispense Refill  . acetaZOLAMIDE (DIAMOX) 250 MG tablet Take 375 mg by mouth 2 (two) times daily.       . cetirizine (ZYRTEC) 10 MG tablet Take 10 mg by mouth daily.        . clonazePAM (KLONOPIN) 1 MG tablet Take 1 mg by mouth daily.      . cyclobenzaprine (FLEXERIL) 10 MG tablet Take 10 mg by mouth 3 (three) times daily as needed for muscle spasms.      . Dimethyl Fumarate (TECFIDERA) 240 MG CPDR Take by mouth.      . gabapentin (NEURONTIN) 600 MG tablet Take 600 mg by mouth daily.        . promethazine (PHENERGAN) 25 MG tablet Take 25 mg by mouth every 6 (six) hours as needed.        . SUMAtriptan (IMITREX) 100 MG tablet Take 100 mg by mouth every 2 (two) hours as needed.         No current facility-administered medications on file prior to visit.  Allergies  Allergen Reactions  . Demerol Hives  . Morphine And Related Hives    States she is not allergic to MS     Immunization History  Administered Date(s) Administered  . Influenza,inj,Quad PF,36+ Mos 12/08/2012  . PPD Test 08/16/2011  . Tdap 07/15/2006       Objective:   Physical Exam  Constitutional: She appears well-nourished. No distress.  HENT:  Head: Normocephalic.  Mouth/Throat: No oropharyngeal exudate.  Eyes: Conjunctivae are normal. Right eye exhibits no discharge. Left eye exhibits no discharge.  Neck: Neck supple.  Cardiovascular: Normal rate, regular rhythm and normal heart sounds.  Exam reveals no gallop and no friction rub.   No murmur heard. Pulmonary/Chest: Effort normal and breath sounds normal. She has no wheezes. She exhibits no tenderness.  Lymphadenopathy:    She has no cervical adenopathy.  Neurological: She is alert.  Skin: Skin is warm and dry. No rash noted.  Psychiatric: She has a normal mood and affect.      Assessment & Plan:    Carrie was seen today for cough and uri.  Diagnoses and associated orders for this  visit:  Cough - azithromycin (ZITHROMAX) 500 MG tablet; Take 1 tablet (500 mg total) by mouth daily. Take 1 tablet daily for 3 days. - benzonatate (TESSALON) 200 MG capsule; Take 1 capsule (200 mg total) by mouth 3 (three) times daily as needed for cough.  Acute upper respiratory infections of unspecified site -     methylPREDNISolone sodium succinate (SOLU-MEDROL) 125 mg/2 mL injection 125 mg; Inject 2 mLs (125 mg total) into the muscle once

## 2013-03-08 ENCOUNTER — Encounter: Payer: Self-pay | Admitting: Family Medicine

## 2013-03-08 DIAGNOSIS — J069 Acute upper respiratory infection, unspecified: Secondary | ICD-10-CM | POA: Insufficient documentation

## 2013-03-12 ENCOUNTER — Emergency Department (HOSPITAL_BASED_OUTPATIENT_CLINIC_OR_DEPARTMENT_OTHER)
Admission: EM | Admit: 2013-03-12 | Discharge: 2013-03-12 | Disposition: A | Discharge status: Home / Self Care | Payer: Federal, State, Local not specified - PPO | Attending: Emergency Medicine | Admitting: Emergency Medicine

## 2013-03-12 ENCOUNTER — Encounter (HOSPITAL_BASED_OUTPATIENT_CLINIC_OR_DEPARTMENT_OTHER): Payer: Self-pay | Admitting: Emergency Medicine

## 2013-03-12 DIAGNOSIS — R209 Unspecified disturbances of skin sensation: Secondary | ICD-10-CM | POA: Insufficient documentation

## 2013-03-12 DIAGNOSIS — G40909 Epilepsy, unspecified, not intractable, without status epilepticus: Secondary | ICD-10-CM | POA: Insufficient documentation

## 2013-03-12 DIAGNOSIS — F4310 Post-traumatic stress disorder, unspecified: Secondary | ICD-10-CM | POA: Insufficient documentation

## 2013-03-12 DIAGNOSIS — G35 Multiple sclerosis: Secondary | ICD-10-CM | POA: Insufficient documentation

## 2013-03-12 DIAGNOSIS — G932 Benign intracranial hypertension: Secondary | ICD-10-CM | POA: Insufficient documentation

## 2013-03-12 DIAGNOSIS — G43909 Migraine, unspecified, not intractable, without status migrainosus: Secondary | ICD-10-CM | POA: Insufficient documentation

## 2013-03-12 MED ORDER — DIPHENHYDRAMINE HCL 50 MG/ML IJ SOLN
25.0000 mg | Freq: Once | INTRAMUSCULAR | Status: AC
  Administered 2013-03-12: 25 mg via INTRAMUSCULAR
  Filled 2013-03-12: qty 1

## 2013-03-12 MED ORDER — METOCLOPRAMIDE HCL 5 MG/ML IJ SOLN
10.0000 mg | Freq: Once | INTRAMUSCULAR | Status: AC
  Administered 2013-03-12: 10 mg via INTRAMUSCULAR
  Filled 2013-03-12: qty 2

## 2013-03-12 MED ORDER — DEXAMETHASONE SODIUM PHOSPHATE 10 MG/ML IJ SOLN
10.0000 mg | Freq: Once | INTRAMUSCULAR | Status: AC
  Administered 2013-03-12: 10 mg via INTRAMUSCULAR
  Filled 2013-03-12: qty 1

## 2013-03-12 NOTE — ED Provider Notes (Signed)
CSN: [id number]     Arrival date & time 03/12/13  0827 History   None    Chief Complaint  Patient presents with  . Migraine   HPI  Joyce Lang presents with a migraine headache for 48 hours. Since Friday. Headache started on Wednesday. It persisted through the day however when away for a short time. She did not medicate herself. She waking yesterday morning with a recurrence of her headache. Left-sided and throbbing. She states now she can feel her pulse in the left side of her head. She is Imitrex twice yesterday with no relief. She waking this morning with worsening. She presents here. She has a history of pseudotumor cerebri diagnosed via elevated pressures on lumbar puncture. She gets lumbar punctures every 4-6 months. She states that is more of a constant pressure headache,  today feels quite like a migraine. Mild nausea. No vomiting. Photophobia. No numbness weakness tingling to extremities other than that her baseline. 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 Takes tecfidera daily, and has good daily control of her MS. She states that she "comes here because the steroid usually helps me". In review of her chart she has been seen here twice and gotten Benadryl, Reglan, and Decadron she states every time it has relieved her headache entirely.  Past Medical History  Diagnosis Date  . Migraine   . Trigeminal neuralgia   . Molar pregnancy   . Seizures   . PTSD (post-traumatic stress disorder)   . Pseudotumor cerebri   . MS (multiple sclerosis)    Past Surgical History  Procedure Laterality Date  . Appendectomy    . Breast surgery    . Cholecystectomy    . Gastric bypass    . Abdominal hysterectomy    . Tonsillectomy    . Carpal tunnel release     Family History  Problem Relation Age of Onset  . Cancer Maternal Aunt     Colon Cancer   . Alcohol abuse Maternal Uncle   . Cancer Paternal Grandfather     Colon Cancer   History  Substance Use Topics   . Smoking status: Never Smoker   . Smokeless tobacco: Never Used  . Alcohol Use: No   OB History   Grav Para Term Preterm Abortions TAB SAB Ect Mult Living                 Review of Systems  Constitutional: Negative for fever, chills, diaphoresis, appetite change and fatigue.  HENT: Negative for mouth sores, sore throat and trouble swallowing.   Eyes: Negative for visual disturbance.  Respiratory: Negative for cough, chest tightness, shortness of breath and wheezing.   Cardiovascular: Negative for chest pain.  Gastrointestinal: Positive for nausea. Negative for vomiting, abdominal pain, diarrhea and abdominal distention.  Endocrine: Negative for polydipsia, polyphagia and polyuria.  Genitourinary: Negative for dysuria, frequency and hematuria.  Musculoskeletal: Negative for gait problem.  Skin: Negative for color change, pallor and rash.  Neurological: Positive for headaches. Negative for dizziness, syncope and light-headedness.  Hematological: Does not bruise/bleed easily.  Psychiatric/Behavioral: Negative for behavioral problems and confusion.    Allergies  Demerol and Morphine and related  Home Medications   Current Outpatient Rx  Name  Route  Sig  Dispense  Refill  . acetaZOLAMIDE (DIAMOX) 250 MG tablet   Oral   Take 375 mg by mouth 2 (two) times daily.          .Marland Kitchen  acyclovir (ZOVIRAX) 800 MG tablet               . benzonatate (TESSALON) 200 MG capsule   Oral   Take 1 capsule (200 mg total) by mouth 3 (three) times daily as needed for cough.   60 capsule   0   . carisoprodol (SOMA) 350 MG tablet               . cetirizine (ZYRTEC) 10 MG tablet   Oral   Take 10 mg by mouth daily.           . clonazePAM (KLONOPIN) 1 MG tablet   Oral   Take 1 mg by mouth daily.         . cyclobenzaprine (FLEXERIL) 10 MG tablet   Oral   Take 10 mg by mouth 3 (three) times daily as needed for muscle spasms.         . Dimethyl Fumarate (TECFIDERA) 240 MG CPDR    Oral   Take by mouth.         . fluticasone (FLONASE) 50 MCG/ACT nasal spray               . gabapentin (NEURONTIN) 600 MG tablet   Oral   Take 600 mg by mouth daily.           . LamoTRIgine 300 MG TB24               . meclizine (ANTIVERT) 25 MG tablet               . NUCYNTA 75 MG TABS               . promethazine (PHENERGAN) 25 MG tablet   Oral   Take 25 mg by mouth every 6 (six) hours as needed.           . SUMAtriptan (IMITREX) 100 MG tablet   Oral   Take 100 mg by mouth every 2 (two) hours as needed.            BP 120/81  Pulse 88  Temp(Src) 98.2 F (36.8 C) (Oral)  Resp 18  Ht 5\' 6"  (1.676 m)  Wt 180 lb (81.647 kg)  BMI 29.07 kg/m2  SpO2 100% Physical Exam  Constitutional: She is oriented to person, place, and time. She appears well-developed and well-nourished. No distress.  HENT:  Head: Normocephalic.  Normal exam of the scalp and skull. Normal HEENT exam.  Eyes: Conjunctivae are normal. Pupils are equal, round, and reactive to light. No scleral icterus.  Well-visualized discs. No papilledema noted.  Neck: Normal range of motion. Neck supple. No thyromegaly present.  Cardiovascular: Normal rate and regular rhythm.  Exam reveals no gallop and no friction rub.   No murmur heard. Pulmonary/Chest: Effort normal and breath sounds normal. No respiratory distress. She has no wheezes. She has no rales.  Abdominal: Soft. Bowel sounds are normal. She exhibits no distension. There is no tenderness. There is no rebound.  Musculoskeletal: Normal range of motion.  Neurological: She is alert and oriented to person, place, and time. GCS eye subscore is 4. GCS verbal subscore is 5. GCS motor subscore is 6.  Cranial nerves II through XII intact. She is ambulatory. Normal use of her extremities.  Skin: Skin is warm and dry. No rash noted.  Psychiatric: She has a normal mood and affect. Her behavior is normal.    ED Course  Procedures (including  critical care time) Labs Review  Labs Reviewed - No data to display Imaging Review No results found.  EKG Interpretation   None       MDM   1. Migraine headache    No unusual features to her. Normal exam for many people in his medication and discharge as above.    Rolland Porter, MD 03/12/13 [phone]

## 2013-03-12 NOTE — Discharge Instructions (Signed)
Migraine Headache A migraine headache is an intense, throbbing pain on one or both sides of your head. A migraine can last for 30 minutes to several hours. CAUSES  The exact cause of a migraine headache is not always known. However, a migraine may be caused when nerves in the brain become irritated and release chemicals that cause inflammation. This causes pain. SYMPTOMS  Pain on one or both sides of your head.  Pulsating or throbbing pain.  Severe pain that prevents daily activities.  Pain that is aggravated by any physical activity.  Nausea, vomiting, or both.  Dizziness.  Pain with exposure to bright lights, loud noises, or activity.  General sensitivity to bright lights, loud noises, or smells. Before you get a migraine, you may get warning signs that a migraine is coming (aura). An aura may include:  Seeing flashing lights.  Seeing bright spots, halos, or zig-zag lines.  Having tunnel vision or blurred vision.  Having feelings of numbness or tingling.  Having trouble talking.  Having muscle weakness. MIGRAINE TRIGGERS  Alcohol.  Smoking.  Stress.  Menstruation.  Aged cheeses.  Foods or drinks that contain nitrates, glutamate, aspartame, or tyramine.  Lack of sleep.  Chocolate.  Caffeine.  Hunger.  Physical exertion.  Fatigue.  Medicines used to treat chest pain (nitroglycerine), birth control pills, estrogen, and some blood pressure medicines. DIAGNOSIS  A migraine headache is often diagnosed based on:  Symptoms.  Physical examination.  A CT scan or MRI of your head. TREATMENT Medicines may be given for pain and nausea. Medicines can also be given to help prevent recurrent migraines.  HOME CARE INSTRUCTIONS  Only take over-the-counter or prescription medicines for pain or discomfort as directed by your caregiver. The use of long-term narcotics is not recommended.  Lie down in a dark, quiet room when you have a migraine.  Keep a journal  to find out what may trigger your migraine headaches. For example, write down:  What you eat and drink.  How much sleep you get.  Any change to your diet or medicines.  Limit alcohol consumption.  Quit smoking if you smoke.  Get 7 to 9 hours of sleep, or as recommended by your caregiver.  Limit stress.  Keep lights dim if bright lights bother you and make your migraines worse. SEEK IMMEDIATE MEDICAL CARE IF:   Your migraine becomes severe.  You have a fever.  You have a stiff neck.  You have vision loss.  You have muscular weakness or loss of muscle control.  You start losing your balance or have trouble walking.  You feel faint or pass out.  You have severe symptoms that are different from your first symptoms. MAKE SURE YOU:   Understand these instructions.  Will watch your condition.  Will get help right away if you are not doing well or get worse. Document Released: 02/18/2005 Document Revised: 05/13/2011 Document Reviewed: 02/08/2011 [employer] Patient Information 2014 [employer].  

## 2013-03-12 NOTE — ED Notes (Signed)
Migraine with blurred vision since Wednesday unrelieved by Imitrex and Phenergan.

## 2013-04-12 ENCOUNTER — Ambulatory Visit: Payer: Federal, State, Local not specified - PPO | Admitting: Family Medicine

## 2013-06-21 ENCOUNTER — Emergency Department (HOSPITAL_BASED_OUTPATIENT_CLINIC_OR_DEPARTMENT_OTHER)
Admission: EM | Admit: 2013-06-21 | Discharge: 2013-06-22 | Disposition: A | Discharge status: Home / Self Care | Payer: Federal, State, Local not specified - PPO | Attending: Emergency Medicine | Admitting: Emergency Medicine

## 2013-06-21 ENCOUNTER — Encounter (HOSPITAL_BASED_OUTPATIENT_CLINIC_OR_DEPARTMENT_OTHER): Payer: Self-pay | Admitting: Emergency Medicine

## 2013-06-21 DIAGNOSIS — Z79899 Other long term (current) drug therapy: Secondary | ICD-10-CM | POA: Insufficient documentation

## 2013-06-21 DIAGNOSIS — G43909 Migraine, unspecified, not intractable, without status migrainosus: Secondary | ICD-10-CM | POA: Insufficient documentation

## 2013-06-21 DIAGNOSIS — G932 Benign intracranial hypertension: Secondary | ICD-10-CM | POA: Insufficient documentation

## 2013-06-21 DIAGNOSIS — G35 Multiple sclerosis: Secondary | ICD-10-CM | POA: Insufficient documentation

## 2013-06-21 DIAGNOSIS — R109 Unspecified abdominal pain: Secondary | ICD-10-CM

## 2013-06-21 DIAGNOSIS — Z9079 Acquired absence of other genital organ(s): Secondary | ICD-10-CM | POA: Insufficient documentation

## 2013-06-21 DIAGNOSIS — R1013 Epigastric pain: Secondary | ICD-10-CM | POA: Insufficient documentation

## 2013-06-21 DIAGNOSIS — F4310 Post-traumatic stress disorder, unspecified: Secondary | ICD-10-CM | POA: Insufficient documentation

## 2013-06-21 DIAGNOSIS — G40909 Epilepsy, unspecified, not intractable, without status epilepticus: Secondary | ICD-10-CM | POA: Insufficient documentation

## 2013-06-21 DIAGNOSIS — G8918 Other acute postprocedural pain: Secondary | ICD-10-CM | POA: Insufficient documentation

## 2013-06-21 DIAGNOSIS — Z9884 Bariatric surgery status: Secondary | ICD-10-CM | POA: Insufficient documentation

## 2013-06-21 LAB — URINALYSIS, ROUTINE W REFLEX MICROSCOPIC
Bilirubin Urine: NEGATIVE
Glucose, UA: NEGATIVE mg/dL
Hgb urine dipstick: NEGATIVE
KETONES UR: NEGATIVE mg/dL
Leukocytes, UA: NEGATIVE
NITRITE: NEGATIVE
PH: 7 (ref 5.0–8.0)
Protein, ur: NEGATIVE mg/dL
Specific Gravity, Urine: 1.012 (ref 1.005–1.030)
Urobilinogen, UA: 0.2 mg/dL (ref 0.0–1.0)

## 2013-06-21 NOTE — ED Provider Notes (Signed)
CSN: [id number]     Arrival date & time 06/21/13  2257 History  This chart was scribed for Geoffery Lyonsouglas Carnella Fryman, MD by Smiley HousemanFallon Davis, ED Scribe. The patient was seen in room MH07/MH07. Patient's care was started at 12:00 AM.  Chief Complaint  Patient presents with  . Post-op Problem    HPI HPI Comments: Joyce Lang is a 43 y.o. female who presents to the Emergency Department complaining of abdominal pain that started about 4 days ago.  Pt states the pain was intermittent to began, but now is constant.  Pt states the pain is near her incision from a recent surgery.  She states she had ovaries removed on 05/28/13, due to complications from a previous hysterectomy.  She denies back pain, dysuria, and nausea.  She states her appetite has decreased due to the pain.  Pt reports she had gastric bypass surgery in 2008.  She [facility].  Pt reports she is allergic to Morphine.    Past Medical History  Diagnosis Date  . Migraine   . Trigeminal neuralgia   . Molar pregnancy   . Seizures   . PTSD (post-traumatic stress disorder)   . Pseudotumor cerebri   . MS (multiple sclerosis)    Past Surgical History  Procedure Laterality Date  . Appendectomy    . Breast surgery    . Cholecystectomy    . Gastric bypass    . Abdominal hysterectomy    . Tonsillectomy    . Carpal tunnel release     Family History  Problem Relation Age of Onset  . Cancer Maternal Aunt     Colon Cancer   . Alcohol abuse Maternal Uncle   . Cancer Paternal Grandfather     Colon Cancer   History  Substance Use Topics  . Smoking status: Never Smoker   . Smokeless tobacco: Never Used  . Alcohol Use: No   OB History   Grav Para Term Preterm Abortions TAB SAB Ect Mult Living                 Review of Systems  A complete 10 system review of systems was obtained and all systems are negative except as noted in the HPI and PMH.    Allergies  Demerol  and Morphine and related  Home Medications   Prior to Admission medications   Medication Sig Start Date End Date Taking? Authorizing Provider  acetaZOLAMIDE (DIAMOX) 250 MG tablet Take 375 mg by mouth 2 (two) times daily.     Historical Provider, MD  acyclovir (ZOVIRAX) 800 MG tablet  01/29/13   Historical Provider, MD  benzonatate (TESSALON) 200 MG capsule Take 1 capsule (200 mg total) by mouth 3 (three) times daily as needed for cough. 02/19/13 02/19/14  Gillian Scarceobyn K Zanard, MD  carisoprodol (SOMA) 350 MG tablet  12/28/12   Historical Provider, MD  cetirizine (ZYRTEC) 10 MG tablet Take 10 mg by mouth daily.      Historical Provider, MD  clonazePAM (KLONOPIN) 1 MG tablet Take 1 mg by mouth daily.    Historical Provider, MD  cyclobenzaprine (FLEXERIL) 10 MG tablet Take 10 mg by mouth 3 (three) times daily as needed for muscle spasms.    Historical Provider, MD  Dimethyl Fumarate (TECFIDERA) 240 MG CPDR Take by mouth.    Historical Provider, MD  fluticasone Aleda Grana(FLONASE) 50 MCG/ACT nasal spray  12/21/12   Historical Provider, MD  gabapentin (NEURONTIN) 600 MG tablet Take 600 mg by mouth daily.      Historical Provider, MD  LamoTRIgine 300 MG TB24  02/01/13   Historical Provider, MD  meclizine (ANTIVERT) 25 MG tablet  01/25/13   Historical Provider, MD  NUCYNTA 75 MG TABS  02/02/13   Historical Provider, MD  promethazine (PHENERGAN) 25 MG tablet Take 25 mg by mouth every 6 (six) hours as needed.      Historical Provider, MD  SUMAtriptan (IMITREX) 100 MG tablet Take 100 mg by mouth every 2 (two) hours as needed.      Historical Provider, MD   Triage Vitals: BP 113/71  Pulse 97  Temp(Src) 98.3 F (36.8 C) (Oral)  Resp 16  Ht 5\' 6"  (1.676 m)  Wt 180 lb (81.647 kg)  BMI 29.07 kg/m2  SpO2 100%  Physical Exam  Nursing note and vitals reviewed. Constitutional: She is oriented to person, place, and time. She appears well-developed and well-nourished. No distress.  HENT:  Head: Normocephalic and  atraumatic.  Eyes: Conjunctivae and EOM are normal. Right eye exhibits no discharge. Left eye exhibits no discharge.  Neck: Neck supple. No tracheal deviation present.  Cardiovascular: Normal rate, regular rhythm and normal heart sounds.  Exam reveals no gallop and no friction rub.   No murmur heard. Pulmonary/Chest: Effort normal and breath sounds normal. No respiratory distress. She has no wheezes. She has no rales. She exhibits no tenderness.  Abdominal: Soft. Normal appearance and bowel sounds are normal. She exhibits no distension and no mass. There is tenderness. There is no rebound and no guarding.  There is TTP to the epigastric region.    Musculoskeletal: Normal range of motion.  Neurological: She is alert and oriented to person, place, and time.  Skin: Skin is warm and dry. No rash noted.  Psychiatric: She has a normal mood and affect. Her behavior is normal.    ED Course  Procedures (including critical care time) DIAGNOSTIC STUDIES: Oxygen Saturation is 100% on RA, normal by my interpretation.    COORDINATION OF CARE: 12:07 AM-Will order UA, CT Abdomen Pelvis, CBC with diff, and Comprehensive metabolic panel.  Will order IV fluids, Dilaudid, and Zofran.  Patient informed of current plan of treatment and evaluation and agrees with plan.    Results for orders placed during the hospital encounter of 06/21/13  URINALYSIS, ROUTINE W REFLEX MICROSCOPIC      Result Value Ref Range   Color, Urine YELLOW  YELLOW   APPearance CLEAR  CLEAR   Specific Gravity, Urine 1.012  1.005 - 1.030   pH 7.0  5.0 - 8.0   Glucose, UA NEGATIVE  NEGATIVE mg/dL   Hgb urine dipstick NEGATIVE  NEGATIVE   Bilirubin Urine NEGATIVE  NEGATIVE   Ketones, ur NEGATIVE  NEGATIVE mg/dL   Protein, ur NEGATIVE  NEGATIVE mg/dL   Urobilinogen, UA 0.2  0.0 - 1.0 mg/dL   Nitrite NEGATIVE  NEGATIVE   Leukocytes, UA NEGATIVE  NEGATIVE  CBC WITH DIFFERENTIAL      Result Value Ref Range   WBC 9.8  4.0 - 10.5 K/uL    RBC 3.95  3.87 - 5.11 MIL/uL   Hemoglobin 12.1  12.0 - 15.0 g/dL   HCT 16.1  09.6 - 04.5 %   MCV 94.2  78.0 - 100.0 fL   MCH 30.6  26.0 - 34.0 pg   MCHC 32.5  30.0 - 36.0 g/dL   RDW 40.9  81.1 - 91.4 %  Platelets 251  150 - 400 K/uL   Neutrophils Relative % 57  43 - 77 %   Neutro Abs 5.6  1.7 - 7.7 K/uL   Lymphocytes Relative 30  12 - 46 %   Lymphs Abs 2.9  0.7 - 4.0 K/uL   Monocytes Relative 11  3 - 12 %   Monocytes Absolute 1.1 (*) 0.1 - 1.0 K/uL   Eosinophils Relative 2  0 - 5 %   Eosinophils Absolute 0.2  0.0 - 0.7 K/uL   Basophils Relative 0  0 - 1 %   Basophils Absolute 0.0  0.0 - 0.1 K/uL    Imaging Review No results found.   EKG Interpretation None      MDM   Final diagnoses:  None    Patient is a 43 year old female with history of gastric bypass surgery and who is also 2 weeks status post oophorectomy. She presents today with complaints of epigastric discomfort. Physical examination reveals tenderness in the epigastrium without rebound or guarding. She states she has had an obstruction in the past however she denies any vomiting and has been having bowel movements and passing gas. I believe an obstruction is extremely unlikely given the scenario.  To to her multiple abdominal surgeries, a CT scan of the abdomen and pelvis was obtained. This revealed no acute process. There is no evidence for obstruction, bleeding, or abscess formation. She has been given pain medication and is appearing much more comfortable. At this point I believe she is stable for discharge. She is to see her surgeon if things are not improving in the next 2-3 days.  I personally performed the services described in this documentation, which was scribed in my presence. The recorded information has been reviewed and is accurate.        [name], MD 06/22/13 [phone]

## 2013-06-21 NOTE — ED Notes (Signed)
Pt c/o abd pain at incision  site , ovary removal  3/27

## 2013-06-22 ENCOUNTER — Emergency Department (HOSPITAL_BASED_OUTPATIENT_CLINIC_OR_DEPARTMENT_OTHER): Payer: Federal, State, Local not specified - PPO

## 2013-06-22 LAB — COMPREHENSIVE METABOLIC PANEL
ALBUMIN: 4.2 g/dL (ref 3.5–5.2)
ALK PHOS: 65 U/L (ref 39–117)
ALT: 15 U/L (ref 0–35)
AST: 18 U/L (ref 0–37)
BUN: 10 mg/dL (ref 6–23)
CALCIUM: 9.3 mg/dL (ref 8.4–10.5)
CO2: 20 mEq/L (ref 19–32)
Chloride: 110 mEq/L (ref 96–112)
Creatinine, Ser: 0.9 mg/dL (ref 0.50–1.10)
GFR calc Af Amer: >90 mL/min (ref >90)
GFR calc non Af Amer: 78 mL/min — ABNORMAL LOW (ref >90)
Glucose, Bld: 102 mg/dL — ABNORMAL HIGH (ref 70–99)
POTASSIUM: 3.8 meq/L (ref 3.7–5.3)
SODIUM: 143 meq/L (ref 137–147)
Total Bilirubin: <0.2 mg/dL — ABNORMAL LOW (ref 0.3–1.2)
Total Protein: 7.2 g/dL (ref 6.0–8.3)

## 2013-06-22 LAB — LIPASE, BLOOD: LIPASE: 43 U/L (ref 11–59)

## 2013-06-22 LAB — CBC WITH DIFFERENTIAL/PLATELET
Basophils Absolute: 0 10*3/uL (ref 0.0–0.1)
Basophils Relative: 0 % (ref 0–1)
EOS PCT: 2 % (ref 0–5)
Eosinophils Absolute: 0.2 10*3/uL (ref 0.0–0.7)
HCT: 37.2 % (ref 36.0–46.0)
Hemoglobin: 12.1 g/dL (ref 12.0–15.0)
Lymphocytes Relative: 30 % (ref 12–46)
Lymphs Abs: 2.9 10*3/uL (ref 0.7–4.0)
MCH: 30.6 pg (ref 26.0–34.0)
MCHC: 32.5 g/dL (ref 30.0–36.0)
MCV: 94.2 fL (ref 78.0–100.0)
Monocytes Absolute: 1.1 10*3/uL — ABNORMAL HIGH (ref 0.1–1.0)
Monocytes Relative: 11 % (ref 3–12)
NEUTROS PCT: 57 % (ref 43–77)
Neutro Abs: 5.6 10*3/uL (ref 1.7–7.7)
PLATELETS: 251 10*3/uL (ref 150–400)
RBC: 3.95 MIL/uL (ref 3.87–5.11)
RDW: 14.2 % (ref 11.5–15.5)
WBC: 9.8 10*3/uL (ref 4.0–10.5)

## 2013-06-22 MED ORDER — SODIUM CHLORIDE 0.9 % IV BOLUS (SEPSIS)
1000.0000 mL | Freq: Once | INTRAVENOUS | Status: AC
  Administered 2013-06-22: 1000 mL via INTRAVENOUS

## 2013-06-22 MED ORDER — OXYCODONE-ACETAMINOPHEN 5-325 MG PO TABS: 2.0000 | ORAL_TABLET | ORAL | Status: DC | PRN

## 2013-06-22 MED ORDER — IOHEXOL 300 MG/ML  SOLN
50.0000 mL | Freq: Once | INTRAMUSCULAR | Status: AC | PRN
  Administered 2013-06-22: 50 mL via ORAL

## 2013-06-22 MED ORDER — HYDROMORPHONE HCL PF 1 MG/ML IJ SOLN
1.0000 mg | Freq: Once | INTRAMUSCULAR | Status: AC
  Administered 2013-06-22: 1 mg via INTRAVENOUS
  Filled 2013-06-22: qty 1

## 2013-06-22 MED ORDER — ONDANSETRON HCL 4 MG/2ML IJ SOLN
4.0000 mg | Freq: Once | INTRAMUSCULAR | Status: AC
  Administered 2013-06-22: 4 mg via INTRAVENOUS
  Filled 2013-06-22: qty 2

## 2013-06-22 MED ORDER — IOHEXOL 300 MG/ML  SOLN
100.0000 mL | Freq: Once | INTRAMUSCULAR | Status: AC | PRN
  Administered 2013-06-22: 100 mL via INTRAVENOUS

## 2013-06-22 NOTE — Discharge Instructions (Signed)
Percocet as prescribed as needed for pain.  Followup with your surgeon if not improving in the next 24 hours. Return to the ER if you develop worsening pain, high fever, bloody stool, or other new and concerning symptoms.   Abdominal Pain, Adult Many things can cause abdominal pain. Usually, abdominal pain is not caused by a disease and will improve without treatment. It can often be observed and treated at home. Your health care provider will do a physical exam and possibly order blood tests and X-rays to help determine the seriousness of your pain. However, in many cases, more time must pass before a clear cause of the pain can be found. Before that point, your health care provider may not know if you need more testing or further treatment. HOME CARE INSTRUCTIONS  Monitor your abdominal pain for any changes. The following actions may help to alleviate any discomfort you are experiencing:  Only take over-the-counter or prescription medicines as directed by your health care provider.  Do not take laxatives unless directed to do so by your health care provider.  Try a clear liquid diet (broth, tea, or water) as directed by your health care provider. Slowly move to a bland diet as tolerated. SEEK MEDICAL CARE IF:  You have unexplained abdominal pain.  You have abdominal pain associated with nausea or diarrhea.  You have pain when you urinate or have a bowel movement.  You experience abdominal pain that wakes you in the night.  You have abdominal pain that is worsened or improved by eating food.  You have abdominal pain that is worsened with eating fatty foods. SEEK IMMEDIATE MEDICAL CARE IF:   Your pain does not go away within 2 hours.  You have a fever.  You keep throwing up (vomiting).  Your pain is felt only in portions of the abdomen, such as the right side or the left lower portion of the abdomen.  You pass bloody or black tarry stools. MAKE SURE YOU:  Understand these  instructions.   Will watch your condition.   Will get help right away if you are not doing well or get worse.  Document Released: 11/28/2004 Document Revised: 12/09/2012 Document Reviewed: 10/28/2012 [employer] Patient Information 2014 [employer] [place].

## 2013-08-03 ENCOUNTER — Telehealth: Payer: Self-pay

## 2013-08-03 NOTE — Telephone Encounter (Signed)
Joyce Lang called and would like for you to call her back, she has some questions about Hypotension and was wondering if she needs to be seen. Her number is [phone]

## 2013-08-08 ENCOUNTER — Emergency Department (HOSPITAL_BASED_OUTPATIENT_CLINIC_OR_DEPARTMENT_OTHER): Payer: Federal, State, Local not specified - PPO

## 2013-08-08 ENCOUNTER — Encounter (HOSPITAL_BASED_OUTPATIENT_CLINIC_OR_DEPARTMENT_OTHER): Payer: Self-pay | Admitting: Emergency Medicine

## 2013-08-08 ENCOUNTER — Emergency Department (HOSPITAL_BASED_OUTPATIENT_CLINIC_OR_DEPARTMENT_OTHER)
Admission: EM | Admit: 2013-08-08 | Discharge: 2013-08-08 | Disposition: A | Discharge status: Home / Self Care | Payer: Federal, State, Local not specified - PPO | Attending: Emergency Medicine | Admitting: Emergency Medicine

## 2013-08-08 DIAGNOSIS — S1093XA Contusion of unspecified part of neck, initial encounter: Secondary | ICD-10-CM

## 2013-08-08 DIAGNOSIS — R42 Dizziness and giddiness: Secondary | ICD-10-CM | POA: Insufficient documentation

## 2013-08-08 DIAGNOSIS — S199XXA Unspecified injury of neck, initial encounter: Secondary | ICD-10-CM

## 2013-08-08 DIAGNOSIS — S0993XA Unspecified injury of face, initial encounter: Secondary | ICD-10-CM | POA: Insufficient documentation

## 2013-08-08 DIAGNOSIS — S0003XA Contusion of scalp, initial encounter: Secondary | ICD-10-CM | POA: Insufficient documentation

## 2013-08-08 DIAGNOSIS — Z79899 Other long term (current) drug therapy: Secondary | ICD-10-CM | POA: Insufficient documentation

## 2013-08-08 DIAGNOSIS — Y9389 Activity, other specified: Secondary | ICD-10-CM | POA: Insufficient documentation

## 2013-08-08 DIAGNOSIS — R55 Syncope and collapse: Secondary | ICD-10-CM

## 2013-08-08 DIAGNOSIS — G35 Multiple sclerosis: Secondary | ICD-10-CM | POA: Insufficient documentation

## 2013-08-08 DIAGNOSIS — F4310 Post-traumatic stress disorder, unspecified: Secondary | ICD-10-CM | POA: Insufficient documentation

## 2013-08-08 DIAGNOSIS — T148XXA Other injury of unspecified body region, initial encounter: Secondary | ICD-10-CM

## 2013-08-08 DIAGNOSIS — S0083XA Contusion of other part of head, initial encounter: Secondary | ICD-10-CM | POA: Insufficient documentation

## 2013-08-08 DIAGNOSIS — G40909 Epilepsy, unspecified, not intractable, without status epilepticus: Secondary | ICD-10-CM | POA: Insufficient documentation

## 2013-08-08 DIAGNOSIS — M62838 Other muscle spasm: Secondary | ICD-10-CM

## 2013-08-08 DIAGNOSIS — Y92009 Unspecified place in unspecified non-institutional (private) residence as the place of occurrence of the external cause: Secondary | ICD-10-CM | POA: Insufficient documentation

## 2013-08-08 DIAGNOSIS — G43909 Migraine, unspecified, not intractable, without status migrainosus: Secondary | ICD-10-CM | POA: Insufficient documentation

## 2013-08-08 DIAGNOSIS — R296 Repeated falls: Secondary | ICD-10-CM | POA: Insufficient documentation

## 2013-08-08 HISTORY — DX: Nonrheumatic mitral (valve) prolapse: I34.1

## 2013-08-08 LAB — BASIC METABOLIC PANEL
BUN: 10 mg/dL (ref 6–23)
CHLORIDE: 112 meq/L (ref 96–112)
CO2: 21 mEq/L (ref 19–32)
Calcium: 9.4 mg/dL (ref 8.4–10.5)
Creatinine, Ser: 0.9 mg/dL (ref 0.50–1.10)
GFR calc Af Amer: >90 mL/min (ref >90)
GFR calc non Af Amer: 78 mL/min — ABNORMAL LOW (ref >90)
GLUCOSE: 98 mg/dL (ref 70–99)
POTASSIUM: 3.9 meq/L (ref 3.7–5.3)
Sodium: 145 mEq/L (ref 137–147)

## 2013-08-08 LAB — CBC WITH DIFFERENTIAL/PLATELET
Basophils Absolute: 0 10*3/uL (ref 0.0–0.1)
Basophils Relative: 0 % (ref 0–1)
Eosinophils Absolute: 0.1 10*3/uL (ref 0.0–0.7)
Eosinophils Relative: 1 % (ref 0–5)
HEMATOCRIT: 37.2 % (ref 36.0–46.0)
HEMOGLOBIN: 11.9 g/dL — AB (ref 12.0–15.0)
LYMPHS PCT: 30 % (ref 12–46)
Lymphs Abs: 2.3 10*3/uL (ref 0.7–4.0)
MCH: 30.4 pg (ref 26.0–34.0)
MCHC: 32 g/dL (ref 30.0–36.0)
MCV: 94.9 fL (ref 78.0–100.0)
MONOS PCT: 12 % (ref 3–12)
Monocytes Absolute: 0.9 10*3/uL (ref 0.1–1.0)
NEUTROS ABS: 4.3 10*3/uL (ref 1.7–7.7)
NEUTROS PCT: 57 % (ref 43–77)
Platelets: 234 10*3/uL (ref 150–400)
RBC: 3.92 MIL/uL (ref 3.87–5.11)
RDW: 14.9 % (ref 11.5–15.5)
WBC: 7.6 10*3/uL (ref 4.0–10.5)

## 2013-08-08 LAB — TROPONIN I: Troponin I: <0.3 ng/mL (ref <0.30)

## 2013-08-08 MED ORDER — SODIUM CHLORIDE 0.9 % IV BOLUS (SEPSIS)
1000.0000 mL | Freq: Once | INTRAVENOUS | Status: AC
  Administered 2013-08-08: 1000 mL via INTRAVENOUS

## 2013-08-08 MED ORDER — ONDANSETRON 4 MG PO TBDP
4.0000 mg | ORAL_TABLET | Freq: Once | ORAL | Status: AC
  Administered 2013-08-08: 4 mg via ORAL
  Filled 2013-08-08: qty 1

## 2013-08-08 MED ORDER — ONDANSETRON 8 MG PO TBDP: 8.0000 mg | ORAL_TABLET | Freq: Three times a day (TID) | ORAL | Status: DC | PRN

## 2013-08-08 MED ORDER — HYDROCODONE-ACETAMINOPHEN 5-325 MG PO TABS
2.0000 | ORAL_TABLET | Freq: Once | ORAL | Status: AC
  Administered 2013-08-08: 2 via ORAL
  Filled 2013-08-08: qty 2

## 2013-08-08 NOTE — Discharge Instructions (Signed)
We saw you in the ER after you passed out. All the results in the ER are normal, labs and imaging. We are not sure what made you pass out - could be dehydration, could be MS. Doesn't appear to be heart  -which is the most concerning cause. The workup in the ER is not complete, and is limited to screening for life threatening and emergent conditions only, so please see a primary care doctor for further evaluation.   Contusion A contusion is a deep bruise. Contusions are the result of an injury that caused bleeding under the skin. The contusion may turn blue, purple, or yellow. Minor injuries will give you a painless contusion, but more severe contusions may stay painful and swollen for a few weeks.  CAUSES  A contusion is usually caused by a blow, trauma, or direct force to an area of the body. SYMPTOMS   Swelling and redness of the injured area.  Bruising of the injured area.  Tenderness and soreness of the injured area.  Pain. DIAGNOSIS  The diagnosis can be made by taking a history and physical exam. An X-ray, CT scan, or MRI may be needed to determine if there were any associated injuries, such as fractures. TREATMENT  Specific treatment will depend on what area of the body was injured. In general, the best treatment for a contusion is resting, icing, elevating, and applying cold compresses to the injured area. Over-the-counter medicines may also be recommended for pain control. Ask your caregiver what the best treatment is for your contusion. HOME CARE INSTRUCTIONS   Put ice on the injured area.  Put ice in a plastic bag.  Place a towel between your skin and the bag.  Leave the ice on for 15-20 minutes, 03-04 times a day.  Only take over-the-counter or prescription medicines for pain, discomfort, or fever as directed by your caregiver. Your caregiver may recommend avoiding anti-inflammatory medicines (aspirin, ibuprofen, and naproxen) for 48 hours because these medicines may  increase bruising.  Rest the injured area.  If possible, elevate the injured area to reduce swelling. SEEK IMMEDIATE MEDICAL CARE IF:   You have increased bruising or swelling.  You have pain that is getting worse.  Your swelling or pain is not relieved with medicines. MAKE SURE YOU:   Understand these instructions.  Will watch your condition.  Will get help right away if you are not doing well or get worse. Document Released: 11/28/2004 Document Revised: 05/13/2011 Document Reviewed: 12/24/2010 [facility] Patient Information 2014 [employer], [place]. Syncope Syncope is a fainting spell. This means the person loses consciousness and drops to the ground. The person is generally unconscious for less than 5 minutes. The person may have some muscle twitches for up to 15 seconds before waking up and returning to normal. Syncope occurs more often in elderly people, but it can happen to anyone. While most causes of syncope are not dangerous, syncope can be a sign of a serious medical problem. It is important to seek medical care.  CAUSES  Syncope is caused by a sudden decrease in blood flow to the brain. The specific cause is often not determined. Factors that can trigger syncope include:  Taking medicines that lower blood pressure.  Sudden changes in posture, such as standing up suddenly.  Taking more medicine than prescribed.  Standing in one place for too long.  Seizure disorders.  Dehydration and excessive exposure to heat.  Low blood sugar (hypoglycemia).  Straining to have a bowel movement.  Heart  disease, irregular heartbeat, or other circulatory problems.  Fear, emotional distress, seeing blood, or severe pain. SYMPTOMS  Right before fainting, you may:  Feel dizzy or lightheaded.  Feel nauseous.  See all white or all black in your field of vision.  Have cold, clammy skin. DIAGNOSIS  Your caregiver will ask about your symptoms, perform a physical exam, and perform  electrocardiography (ECG) to record the electrical activity of your heart. Your caregiver may also perform other heart or blood tests to determine the cause of your syncope. TREATMENT  In most cases, no treatment is needed. Depending on the cause of your syncope, your caregiver may recommend changing or stopping some of your medicines. HOME CARE INSTRUCTIONS  Have someone stay with you until you feel stable.  Do not drive, operate machinery, or play sports until your caregiver says it is okay.  Keep all follow-up appointments as directed by your caregiver.  Lie down right away if you start feeling like you might faint. Breathe deeply and steadily. Wait until all the symptoms have passed.  Drink enough fluids to keep your urine clear or pale yellow.  If you are taking blood pressure or heart medicine, get up slowly, taking several minutes to sit and then stand. This can reduce dizziness. SEEK IMMEDIATE MEDICAL CARE IF:   You have a severe headache.  You have unusual pain in the chest, abdomen, or back.  You are bleeding from the mouth or rectum, or you have black or tarry stool.  You have an irregular or very fast heartbeat.  You have pain with breathing.  You have repeated fainting or seizure-like jerking during an episode.  You faint when sitting or lying down.  You have confusion.  You have difficulty walking.  You have severe weakness.  You have vision problems. If you fainted, call your local emergency services (911 in [place] Do not drive yourself to the hospital.  MAKE SURE YOU:  Understand these instructions.  Will watch your condition.  Will get help right away if you are not doing well or get worse. Document Released: 02/18/2005 Document Revised: 08/20/2011 Document Reviewed: 04/19/2011 [facility] Patient Information 2014 [employer], [place].

## 2013-08-08 NOTE — ED Provider Notes (Signed)
CSN: [id number]     Arrival date & time 08/08/13  [id number] History   First MD Initiated Contact with Patient 08/08/13 0901     Chief Complaint  Patient presents with  . Head Injury     (Consider location/radiation/quality/duration/timing/severity/associated sxs/prior Treatment) HPI Comments: Pt comes in post fall. Had a fall y'day - due to loss of consciousness. Hx of MS. PT reports that she was reading her bible at a kitchen counter, and the next thing she recalls, she was on the floor. She has a headache, left sided, severe and getting worse and nausea. She has no new vision complains or numbness. Pt has no hx of cardiac disease, no syncope. Leading upto yday, no hx of chest pain, palpitation. Preceding the syncope, there was no prodrome. Pt has no family hx of premature CAD or unexplained deaths in young persons.  Patient is a [age] y.o. female presenting with head injury. The history is provided by the patient.  Head Injury Associated symptoms: headache and neck pain   Associated symptoms: no nausea and no vomiting     Past Medical History  Diagnosis Date  . Migraine   . Trigeminal neuralgia   . Molar pregnancy   . Seizures   . PTSD (post-traumatic stress disorder)   . Pseudotumor cerebri   . MS (multiple sclerosis)   . Mitral valve prolapse    Past Surgical History  Procedure Laterality Date  . Appendectomy    . Breast surgery    . Cholecystectomy    . Gastric bypass    . Abdominal hysterectomy    . Tonsillectomy    . Carpal tunnel release     Family History  Problem Relation Age of Onset  . Cancer Maternal Aunt     Colon Cancer   . Alcohol abuse Maternal Uncle   . Cancer Paternal Grandfather     Colon Cancer   History  Substance Use Topics  . Smoking status: Never Smoker   . Smokeless tobacco: Never Used  . Alcohol Use: No   OB History   Grav Para Term Preterm Abortions TAB SAB Ect Mult Living                 Review of Systems  Constitutional: Negative for  activity change.  HENT: Negative for facial swelling.   Respiratory: Negative for cough, shortness of breath and wheezing.   Cardiovascular: Negative for chest pain.  Gastrointestinal: Negative for nausea, vomiting, abdominal pain, diarrhea, constipation, blood in stool and abdominal distention.  Genitourinary: Negative for hematuria and difficulty urinating.  Musculoskeletal: Positive for myalgias and neck pain.  Skin: Negative for color change.  Neurological: Positive for dizziness, syncope, light-headedness and headaches. Negative for speech difficulty.  Hematological: Does not bruise/bleed easily.  Psychiatric/Behavioral: Negative for confusion.      Allergies  Demerol and Morphine and related  Home Medications   Prior to Admission medications   Medication Sig Start Date End Date Taking? Authorizing Provider  acetaZOLAMIDE (DIAMOX) 250 MG tablet Take 375 mg by mouth 2 (two) times daily.     Historical Provider, MD  acyclovir (ZOVIRAX) 800 MG tablet  01/29/13   Historical Provider, MD  benzonatate (TESSALON) 200 MG capsule Take 1 capsule (200 mg total) by mouth 3 (three) times daily as needed for cough. 02/19/13 02/19/14  Gillian Scarceobyn K Zanard, MD  carisoprodol (SOMA) 350 MG tablet  12/28/12   Historical Provider, MD  cetirizine (ZYRTEC) 10 MG tablet Take 10 mg by mouth daily.  Historical Provider, MD  clonazePAM (KLONOPIN) 1 MG tablet Take 1 mg by mouth daily.    Historical Provider, MD  cyclobenzaprine (FLEXERIL) 10 MG tablet Take 10 mg by mouth 3 (three) times daily as needed for muscle spasms.    Historical Provider, MD  Dimethyl Fumarate (TECFIDERA) 240 MG CPDR Take by mouth.    Historical Provider, MD  fluticasone Aleda Grana) 50 MCG/ACT nasal spray  12/21/12   Historical Provider, MD  gabapentin (NEURONTIN) 600 MG tablet Take 600 mg by mouth daily.      Historical Provider, MD  LamoTRIgine 300 MG TB24  02/01/13   Historical Provider, MD  meclizine (ANTIVERT) 25 MG tablet  01/25/13    Historical Provider, MD  NUCYNTA 75 MG TABS  02/02/13   Historical Provider, MD  ondansetron (ZOFRAN ODT) 8 MG disintegrating tablet Take 1 tablet (8 mg total) by mouth every 8 (eight) hours as needed for nausea. 08/08/13   Derwood Kaplan, MD  oxyCODONE-acetaminophen (PERCOCET) 5-325 MG per tablet Take 2 tablets by mouth every 4 (four) hours as needed. 06/22/13   [name], MD  promethazine (PHENERGAN) 25 MG tablet Take 25 mg by mouth every 6 (six) hours as needed.      Historical Provider, MD  SUMAtriptan (IMITREX) 100 MG tablet Take 100 mg by mouth every 2 (two) hours as needed.      Historical Provider, MD   BP 117/85  Pulse 83  Temp(Src) 98.5 F (36.9 C) (Oral)  Resp 18  Ht 5\' 6"  (1.676 m)  Wt 185 lb (83.915 kg)  BMI 29.87 kg/m2  SpO2 98% Physical Exam  Nursing note and vitals reviewed. Constitutional: She is oriented to person, place, and time. She appears well-developed and well-nourished.  HENT:  Head: Normocephalic and atraumatic.  Eyes: EOM are normal. Pupils are equal, round, and reactive to light.  Neck: Neck supple.  Cardiovascular: Normal rate, regular rhythm and normal heart sounds.   No murmur heard. Pulmonary/Chest: Effort normal. No respiratory distress.  Abdominal: Soft. She exhibits no distension. There is no tenderness. There is no rebound and no guarding.  Neurological: She is alert and oriented to person, place, and time.  Skin: Skin is warm and dry.    ED Course  Procedures (including critical care time) Labs Review Labs Reviewed  CBC WITH DIFFERENTIAL - Abnormal; Notable for the following:    Hemoglobin 11.9 (*)    All other components within normal limits  BASIC METABOLIC PANEL - Abnormal; Notable for the following:    GFR calc non Af Amer 78 (*)    All other components within normal limits  TROPONIN I    Imaging Review Dg Cervical Spine Complete  08/08/2013   CLINICAL DATA:  Fall.  Left neck pain.  EXAM: CERVICAL SPINE  4+ VIEWS  COMPARISON:   None.  FINDINGS: Vertebral body alignment and heights are normal. Disc space heights are normal. There is no compression fracture or subluxation. Prevertebral soft tissues are normal. The neural foramina are patent. There subtle uncovertebral joint spurring left worse than right. The atlantoaxial articulation is normal.  IMPRESSION: No acute findings.   Electronically Signed   By: Elberta Fortis M.D.   On: 08/08/2013 10:07   Ct Head Wo Contrast  08/08/2013   CLINICAL DATA:  Fall, struck posterior head  EXAM: CT HEAD WITHOUT CONTRAST  TECHNIQUE: Contiguous axial images were obtained from the base of the skull through the vertex without intravenous contrast.  COMPARISON:  Prior CT scan of the  head and cervical spine 10/28/2012  FINDINGS: Negative for acute intracranial hemorrhage, acute infarction, mass, mass effect, hydrocephalus or midline shift. Gray-white differentiation is preserved throughout. No acute soft tissue or calvarial abnormality. The globes and orbits are symmetric and unremarkable. Normal aeration of the mastoid air cells and visualized paranasal sinuses.  IMPRESSION: Negative head CT.   Electronically Signed   By: Malachy Moan M.D.   On: 08/08/2013 09:45     EKG Interpretation   Date/Time:  Sunday August 08 2013 09:20:08 EDT Ventricular Rate:  82 PR Interval:  178 QRS Duration: 74 QT Interval:  350 QTC Calculation: 408 R Axis:   78 Text Interpretation:  Normal sinus rhythm Normal ECG Normal intervals  Normal axis Confirmed by Deanndra Kirley, MD, Kenyana Husak ([id number]) on 08/08/2013 9:36:08  AM      MDM   Final diagnoses:  Syncope  Muscle spasm  Contusion    SYNCOPE DDx includes: Orthostatic hypotension Stroke Vertebral artery dissection/stenosis Dysrhythmia PE Vasovagal/neurocardiogenic syncope Aortic stenosis Valvular disorder/Cardiomyopathy Anemia  FALL DDx includes: - Mechanical falls - ICH - Fractures - Contusions - Soft tissue injury  Pt comes in post syncope. SF  syncope score is 0. Cardiac monitoring and EKG reassuring, and hx and exam are benign as well. Hydrated in the ER  Ordered CT head - as she is having worsening headaches and nausea. And xray cspine, as there is tenderness to palpation. Pretest probability for head bleed or spine fx or cord compression extremely low.        Derwood Kaplan, MD 08/08/13 1059

## 2013-08-08 NOTE — ED Notes (Signed)
Fell from a standing position yesterday and hit her head on the kitchen floor. States that she doesn't remember what happened, she denies that she tripped.

## 2013-08-13 ENCOUNTER — Ambulatory Visit (INDEPENDENT_AMBULATORY_CARE_PROVIDER_SITE_OTHER): Payer: Federal, State, Local not specified - PPO | Admitting: Family Medicine

## 2013-08-13 ENCOUNTER — Encounter: Payer: Self-pay | Admitting: Family Medicine

## 2013-08-13 VITALS — BP 99/66 | HR 86 | Resp 16 | Wt 186.0 lb

## 2013-08-13 DIAGNOSIS — R55 Syncope and collapse: Secondary | ICD-10-CM

## 2013-08-13 DIAGNOSIS — D509 Iron deficiency anemia, unspecified: Secondary | ICD-10-CM

## 2013-08-13 DIAGNOSIS — R609 Edema, unspecified: Secondary | ICD-10-CM

## 2013-08-13 LAB — POCT HEMOGLOBIN: Hemoglobin: 12.3 g/dL (ref 12.2–16.2)

## 2013-08-13 MED ORDER — INTEGRA PLUS PO CAPS: 1.0000 | ORAL_CAPSULE | Freq: Every day | ORAL | Status: DC

## 2013-08-13 NOTE — Progress Notes (Signed)
Subjective:    Patient ID: Joyce Lang, female    DOB: 26-Feb-1971, 43 y.o.   MRN: [id number]  HPI  Joyce Lang is here today concerned about her low blood pressure and skin bruising.  She also has some edema in her ankles. She is concerned about what is going on. She passed out this past weekend which prompted her to go to the ER. The ER told her to follow up on with her PCP.     Review of Systems  Constitutional: Positive for activity change, appetite change and fatigue.  Respiratory: Positive for shortness of breath.   Cardiovascular: Positive for chest pain, palpitations and leg swelling.  Musculoskeletal: Positive for myalgias and neck pain.  Neurological: Positive for numbness and headaches.  Psychiatric/Behavioral: Positive for sleep disturbance. The patient is nervous/anxious.   All other systems reviewed and are negative.    Past Medical History  Diagnosis Date  . Migraine   . Trigeminal neuralgia   . Molar pregnancy   . Seizures   . PTSD (post-traumatic stress disorder)   . Pseudotumor cerebri   . MS (multiple sclerosis)   . Mitral valve prolapse      Past Surgical History  Procedure Laterality Date  . Appendectomy    . Breast surgery    . Cholecystectomy    . Gastric bypass    . Tonsillectomy    . Carpal tunnel release    . Vaginal hysterectomy    . Bilateral salpingoophorectomy       History   Social History Narrative   Marital Status:  Divorced  Joyce Needle(Michael)    Children:  Daughter Joyce Danker(Amara) Son Joyce Clap(Angelo Rue - Suicide 11/13)    Pets: Cat (Joyce Lang)   Living Situation: Lives with daughter.     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 Cancer Maternal Aunt     Colon Cancer   . Alcohol abuse Maternal Uncle   . Cancer Paternal Grandfather    Colon Cancer     Current Outpatient Prescriptions on File Prior to Visit  Medication Sig Dispense Refill  . acetaZOLAMIDE (DIAMOX) 250 MG tablet Take 375 mg by mouth 2 (two) times daily.       Marland Kitchen. acyclovir (ZOVIRAX) 800 MG tablet       . carisoprodol (SOMA) 350 MG tablet       . cetirizine (ZYRTEC) 10 MG tablet Take 10 mg by mouth daily.        . clonazePAM (KLONOPIN) 1 MG tablet Take 1 mg by mouth daily.      . Dimethyl Fumarate (TECFIDERA) 240 MG CPDR Take by mouth.      . fluticasone (FLONASE) 50 MCG/ACT nasal spray       . gabapentin (NEURONTIN) 600 MG tablet Take 600 mg by mouth daily.        . LamoTRIgine 300 MG TB24       . meclizine (ANTIVERT) 25 MG tablet       . oxyCODONE-acetaminophen (PERCOCET) 5-325 MG per tablet Take 2 tablets by mouth every 4 (four) hours as needed.  12 tablet  0  . promethazine (PHENERGAN) 25 MG tablet Take 25 mg by mouth every 6 (six) hours as needed.        .Marland Kitchen  SUMAtriptan (IMITREX) 100 MG tablet Take 100 mg by mouth every 2 (two) hours as needed.         No current facility-administered medications on file prior to visit.     Allergies  Allergen Reactions  . Demerol Hives  . Morphine And Related Hives    States she is not allergic to MS     Immunization History  Administered Date(s) Administered  . Influenza,inj,Quad PF,36+ Mos 12/08/2012  . PPD Test 08/16/2011  . Tdap 07/15/2006       Objective:   Physical Exam  Nursing note and vitals reviewed. Constitutional: She appears well-nourished.  Pulmonary/Chest: Effort normal.  Abdominal: Soft.  Musculoskeletal: She exhibits edema and tenderness.  Neurological: She is alert.  Psychiatric: Her mood appears anxious.      Assessment & Plan:    Shadia was seen today for hypotension.  Diagnoses and associated orders for this visit:  Syncope - Ambulatory referral to Cardiology  Edema - Ambulatory referral to Cardiology  Anemia - POCT hemoglobin - FeFum-FePoly-FA-B Cmp-C-Biot  (INTEGRA PLUS) CAPS; Take 1 capsule by mouth daily.

## 2013-08-20 ENCOUNTER — Ambulatory Visit (INDEPENDENT_AMBULATORY_CARE_PROVIDER_SITE_OTHER): Payer: Federal, State, Local not specified - PPO | Admitting: Cardiology

## 2013-08-20 ENCOUNTER — Encounter: Payer: Self-pay | Admitting: *Deleted

## 2013-08-20 ENCOUNTER — Encounter: Payer: Self-pay | Admitting: Cardiology

## 2013-08-20 VITALS — BP 113/72 | HR 111 | Ht 66.0 in | Wt 186.0 lb

## 2013-08-20 DIAGNOSIS — R002 Palpitations: Secondary | ICD-10-CM

## 2013-08-20 DIAGNOSIS — I059 Rheumatic mitral valve disease, unspecified: Secondary | ICD-10-CM

## 2013-08-20 DIAGNOSIS — R0600 Dyspnea, unspecified: Secondary | ICD-10-CM

## 2013-08-20 DIAGNOSIS — R0989 Other specified symptoms and signs involving the circulatory and respiratory systems: Secondary | ICD-10-CM

## 2013-08-20 DIAGNOSIS — R0602 Shortness of breath: Secondary | ICD-10-CM

## 2013-08-20 DIAGNOSIS — R0609 Other forms of dyspnea: Secondary | ICD-10-CM

## 2013-08-20 DIAGNOSIS — I341 Nonrheumatic mitral (valve) prolapse: Secondary | ICD-10-CM

## 2013-08-20 LAB — BRAIN NATRIURETIC PEPTIDE: BRAIN NATRIURETIC PEPTIDE: 8.7 pg/mL (ref 0.0–100.0)

## 2013-08-20 NOTE — Progress Notes (Signed)
HPI The patient presents for evaluation of multiple complaints but this includes followup of an episode of apparent syncope. She was in the hospital emergency room following this event a few days ago. I reviewed these records. She said she was leaning over in her house to read her Bible. She came to on the floor. She remember any events surrounding this. Her evaluation in the emergency room included normal labs. She has been having palpitations. She describes these as skipping beats. It is not happening daily. She will feel her heart skip and then go back to normal. She's not describing rapid heart rhythms. She's not describing presyncope or orthostatic symptoms. She does get shortness of breath which happened with mild activity and has been progressive. However, she's not describing PND or orthopnea. She does have some cramping in her neck and shoulders but she's not describing substernal chest pressure or arm discomfort.  Allergies  Allergen Reactions  . Demerol Hives  . Morphine And Related Hives    States she is not allergic to MS    Current Outpatient Prescriptions  Medication Sig Dispense Refill  . acetaZOLAMIDE (DIAMOX) 250 MG tablet Take 375 mg by mouth 2 (two) times daily.       Marland Kitchen. acyclovir (ZOVIRAX) 800 MG tablet       . carisoprodol (SOMA) 350 MG tablet       . cetirizine (ZYRTEC) 10 MG tablet Take 10 mg by mouth daily.        . clonazePAM (KLONOPIN) 1 MG tablet Take 1 mg by mouth daily.      . Dimethyl Fumarate (TECFIDERA) 240 MG CPDR Take by mouth.      . fluticasone (FLONASE) 50 MCG/ACT nasal spray       . gabapentin (NEURONTIN) 600 MG tablet Take 600 mg by mouth daily.        . LamoTRIgine 300 MG TB24       . meclizine (ANTIVERT) 25 MG tablet       . Multiple Vitamin (MULTIVITAMIN) tablet Take 1 tablet by mouth daily.      . NON FORMULARY Estradiol cream as directed (daily)      . oxyCODONE-acetaminophen (PERCOCET) 5-325 MG per tablet Take 2 tablets by mouth every 4 (four)  hours as needed.  12 tablet  0  . promethazine (PHENERGAN) 25 MG tablet Take 25 mg by mouth every 6 (six) hours as needed.        . SUMAtriptan (IMITREX) 100 MG tablet Take 100 mg by mouth every 2 (two) hours as needed.         No current facility-administered medications for this visit.    Past Medical History  Diagnosis Date  . Migraine   . Trigeminal neuralgia   . Molar pregnancy   . Seizures   . PTSD (post-traumatic stress disorder)   . Pseudotumor cerebri   . MS (multiple sclerosis)   . Mitral valve prolapse     Past Surgical History  Procedure Laterality Date  . Appendectomy    . Breast surgery    . Cholecystectomy    . Gastric bypass    . Tonsillectomy    . Carpal tunnel release    . Vaginal hysterectomy    . Bilateral salpingoophorectomy      Family History  Problem Relation Age of Onset  . Cancer Maternal Aunt     Colon Cancer   . Alcohol abuse Maternal Uncle   . Cancer Paternal Grandfather     Colon  Cancer    History   Social History  . Marital Status: Single    Spouse Name: N/A    Number of Children: N/A  . Years of Education: N/A   Occupational History  . Not on file.   Social History Main Topics  . Smoking status: Never Smoker   . Smokeless tobacco: Never Used  . Alcohol Use: No  . Drug Use: No  . Sexual Activity: Not Currently    Birth Control/ Protection: Surgical   Other Topics Concern  . Not on file   Social History Narrative   Marital Status:  Divorced  Casimiro Needle)    Children:  Daughter Danley Danker) Son Danton Clap - Suicide 11/13)    Pets: Cat (Max)   Living Situation: Lives with daughter.     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  She was enrolled (full-time) in school at [employer].  She is studying Surveyor, minerals.  She has just decided to take a leave of absence.     Tobacco Use/Exposure:  None    Alcohol Use:  Occasional   Drug Use:  None   Diet:  Regular    Exercise:  Gym 4-5 week (Treadmill & Weights)     Hobbies: Reading, Roller Skating, Dancing    ROS:  Positive for syncope, vertigo, palpitations, edema, back pain.  Otherwise as stated in the HPI and negative for all other systems.  PHYSICAL EXAM BP 113/72  Pulse 111  Ht 5\' 6"  (1.676 m)  Wt 186 lb (84.369 kg)  BMI 30.04 kg/m2 GENERAL:  Well appearing HEENT:  Pupils equal round and reactive, fundi not visualized, oral mucosa unremarkable NECK:  No jugular venous distention, waveform within normal limits, carotid upstroke brisk and symmetric, no bruits, no thyromegaly LYMPHATICS:  No cervical, inguinal adenopathy LUNGS:  Clear to auscultation bilaterally BACK:  No CVA tenderness CHEST:  Unremarkable HEART:  PMI not displaced or sustained,S1 and S2 within normal limits, no S3, no S4, no clicks, no rubs, no murmurs ABD:  Flat, positive bowel sounds normal in frequency in pitch, no bruits, no rebound, no guarding, no midline pulsatile mass, no hepatomegaly, no splenomegaly EXT:  2 plus pulses throughout, no edema, no cyanosis no clubbing SKIN:  No rashes no nodules NEURO:  Cranial nerves II through XII grossly intact, motor grossly intact throughout PSYCH:  Cognitively intact, oriented to person place and time  EKG:  Sinus rhythm, rate 72, axis within normal limits, intervals within normal limits, no acute ST-T wave changes.  08/08/13  ASSESSMENT AND PLAN  PALPITATIONS:  I will start with a 24 hour Holter.  Further management will be based on this.    DYSPNEA:  I will check the echo as below and check a BNP level.    MITRAL VALVE PROLPASE:    I will evaluate with an echocardiogram.  Further evaluation with be based on this result.    SYNCOPE:  The etiology of this is not clear.  The details around this event are not clear.  I will evaluate as above.

## 2013-08-20 NOTE — Patient Instructions (Signed)
The current medical regimen is effective;  continue present plan and medications.  Please have blood work today. (BNP)  Your physician has recommended that you wear a holter monitor. Holter monitors are medical devices that record the heart's electrical activity. Doctors most often use these monitors to diagnose arrhythmias. Arrhythmias are problems with the speed or rhythm of the heartbeat. The monitor is a small, portable device. You can wear one while you do your normal daily activities. This is usually used to diagnose what is causing palpitations/syncope (passing out).  Your physician has requested that you have an echocardiogram. Echocardiography is a painless test that uses sound waves to create images of your heart. It provides your doctor with information about the size and shape of your heart and how well your heart's chambers and valves are working. This procedure takes approximately one hour. There are no restrictions for this procedure.  Follow up in 4 to 6 weeks with a PA/NP.

## 2013-08-22 DIAGNOSIS — R0600 Dyspnea, unspecified: Secondary | ICD-10-CM | POA: Insufficient documentation

## 2013-08-22 DIAGNOSIS — R002 Palpitations: Secondary | ICD-10-CM | POA: Insufficient documentation

## 2013-09-01 ENCOUNTER — Ambulatory Visit (HOSPITAL_COMMUNITY)
Admission: RE | Admit: 2013-09-01 | Discharge: 2013-09-01 | Disposition: A | Discharge status: Home / Self Care | Payer: Federal, State, Local not specified - PPO | Source: Ambulatory Visit | Attending: Cardiovascular Disease | Admitting: Cardiovascular Disease

## 2013-09-01 DIAGNOSIS — I059 Rheumatic mitral valve disease, unspecified: Secondary | ICD-10-CM

## 2013-09-01 DIAGNOSIS — I341 Nonrheumatic mitral (valve) prolapse: Secondary | ICD-10-CM

## 2013-09-01 NOTE — Progress Notes (Signed)
2D Echocardiogram Complete.  09/01/2013   Dalon Reichart, RDCS  

## 2013-09-30 ENCOUNTER — Ambulatory Visit: Payer: Federal, State, Local not specified - PPO | Admitting: Physician Assistant

## 2013-10-18 DIAGNOSIS — R55 Syncope and collapse: Secondary | ICD-10-CM | POA: Insufficient documentation

## 2013-10-18 DIAGNOSIS — R609 Edema, unspecified: Secondary | ICD-10-CM | POA: Insufficient documentation

## 2013-10-18 DIAGNOSIS — D509 Iron deficiency anemia, unspecified: Secondary | ICD-10-CM | POA: Insufficient documentation

## 2013-10-18 HISTORY — DX: Iron deficiency anemia, unspecified: D50.9

## 2013-10-21 ENCOUNTER — Other Ambulatory Visit: Payer: Self-pay | Admitting: Family Medicine

## 2013-10-21 DIAGNOSIS — N3090 Cystitis, unspecified without hematuria: Secondary | ICD-10-CM

## 2013-10-21 MED ORDER — CIPROFLOXACIN HCL 500 MG PO TABS: 500.0000 mg | ORAL_TABLET | Freq: Two times a day (BID) | ORAL | Status: AC

## 2013-10-21 NOTE — Telephone Encounter (Signed)
Joyce Lang called with symptoms consistent with cystitis (urgency, frequency, dysuria & foul-smelling urine).  She is to take Cipro 250 mg (1/2 of the 500) twice a day for 3 days.  If she does not improve, she is to F/U for a urine culture.  RZ

## 2013-10-23 ENCOUNTER — Emergency Department (HOSPITAL_BASED_OUTPATIENT_CLINIC_OR_DEPARTMENT_OTHER)
Admission: EM | Admit: 2013-10-23 | Discharge: 2013-10-23 | Disposition: A | Discharge status: Home / Self Care | Payer: Federal, State, Local not specified - PPO | Attending: Emergency Medicine | Admitting: Emergency Medicine

## 2013-10-23 ENCOUNTER — Emergency Department (HOSPITAL_BASED_OUTPATIENT_CLINIC_OR_DEPARTMENT_OTHER): Payer: Federal, State, Local not specified - PPO

## 2013-10-23 ENCOUNTER — Encounter (HOSPITAL_BASED_OUTPATIENT_CLINIC_OR_DEPARTMENT_OTHER): Payer: Self-pay | Admitting: Emergency Medicine

## 2013-10-23 DIAGNOSIS — R110 Nausea: Secondary | ICD-10-CM | POA: Insufficient documentation

## 2013-10-23 DIAGNOSIS — Z792 Long term (current) use of antibiotics: Secondary | ICD-10-CM | POA: Insufficient documentation

## 2013-10-23 DIAGNOSIS — Z8679 Personal history of other diseases of the circulatory system: Secondary | ICD-10-CM | POA: Insufficient documentation

## 2013-10-23 DIAGNOSIS — F4310 Post-traumatic stress disorder, unspecified: Secondary | ICD-10-CM | POA: Insufficient documentation

## 2013-10-23 DIAGNOSIS — R197 Diarrhea, unspecified: Secondary | ICD-10-CM | POA: Insufficient documentation

## 2013-10-23 DIAGNOSIS — R1013 Epigastric pain: Secondary | ICD-10-CM | POA: Diagnosis not present

## 2013-10-23 DIAGNOSIS — R1084 Generalized abdominal pain: Secondary | ICD-10-CM | POA: Insufficient documentation

## 2013-10-23 DIAGNOSIS — Z9089 Acquired absence of other organs: Secondary | ICD-10-CM | POA: Insufficient documentation

## 2013-10-23 DIAGNOSIS — R109 Unspecified abdominal pain: Secondary | ICD-10-CM | POA: Diagnosis present

## 2013-10-23 DIAGNOSIS — Z9884 Bariatric surgery status: Secondary | ICD-10-CM | POA: Insufficient documentation

## 2013-10-23 DIAGNOSIS — G40909 Epilepsy, unspecified, not intractable, without status epilepticus: Secondary | ICD-10-CM | POA: Diagnosis not present

## 2013-10-23 DIAGNOSIS — Z79899 Other long term (current) drug therapy: Secondary | ICD-10-CM | POA: Insufficient documentation

## 2013-10-23 DIAGNOSIS — G43909 Migraine, unspecified, not intractable, without status migrainosus: Secondary | ICD-10-CM | POA: Diagnosis not present

## 2013-10-23 DIAGNOSIS — Z8669 Personal history of other diseases of the nervous system and sense organs: Secondary | ICD-10-CM | POA: Insufficient documentation

## 2013-10-23 LAB — COMPREHENSIVE METABOLIC PANEL
ALT: 17 U/L (ref 0–35)
AST: 16 U/L (ref 0–37)
Albumin: 3.7 g/dL (ref 3.5–5.2)
Alkaline Phosphatase: 50 U/L (ref 39–117)
Anion gap: 13 (ref 5–15)
BUN: 13 mg/dL (ref 6–23)
CALCIUM: 8.9 mg/dL (ref 8.4–10.5)
CHLORIDE: 111 meq/L (ref 96–112)
CO2: 20 mEq/L (ref 19–32)
CREATININE: 1 mg/dL (ref 0.50–1.10)
GFR calc non Af Amer: 68 mL/min — ABNORMAL LOW (ref >90)
GFR, EST AFRICAN AMERICAN: 79 mL/min — AB (ref >90)
GLUCOSE: 87 mg/dL (ref 70–99)
Potassium: 3.5 mEq/L — ABNORMAL LOW (ref 3.7–5.3)
SODIUM: 144 meq/L (ref 137–147)
Total Protein: 6.6 g/dL (ref 6.0–8.3)

## 2013-10-23 LAB — URINALYSIS, ROUTINE W REFLEX MICROSCOPIC
Bilirubin Urine: NEGATIVE
Glucose, UA: NEGATIVE mg/dL
Hgb urine dipstick: NEGATIVE
KETONES UR: NEGATIVE mg/dL
LEUKOCYTES UA: NEGATIVE
NITRITE: NEGATIVE
Protein, ur: NEGATIVE mg/dL
Specific Gravity, Urine: 1.01 (ref 1.005–1.030)
Urobilinogen, UA: 0.2 mg/dL (ref 0.0–1.0)
pH: 7 (ref 5.0–8.0)

## 2013-10-23 LAB — CBC WITH DIFFERENTIAL/PLATELET
BASOS PCT: 0 % (ref 0–1)
Basophils Absolute: 0 10*3/uL (ref 0.0–0.1)
Eosinophils Absolute: 0.1 10*3/uL (ref 0.0–0.7)
Eosinophils Relative: 1 % (ref 0–5)
HCT: 34.9 % — ABNORMAL LOW (ref 36.0–46.0)
HEMOGLOBIN: 10.8 g/dL — AB (ref 12.0–15.0)
LYMPHS ABS: 4.9 10*3/uL — AB (ref 0.7–4.0)
Lymphocytes Relative: 36 % (ref 12–46)
MCH: 28.5 pg (ref 26.0–34.0)
MCHC: 30.9 g/dL (ref 30.0–36.0)
MCV: 92.1 fL (ref 78.0–100.0)
MONO ABS: 1.8 10*3/uL — AB (ref 0.1–1.0)
Monocytes Relative: 13 % — ABNORMAL HIGH (ref 3–12)
Neutro Abs: 6.9 10*3/uL (ref 1.7–7.7)
Neutrophils Relative %: 50 % (ref 43–77)
PLATELETS: 261 10*3/uL (ref 150–400)
RBC: 3.79 MIL/uL — ABNORMAL LOW (ref 3.87–5.11)
RDW: 15.1 % (ref 11.5–15.5)
WBC: 13.7 10*3/uL — AB (ref 4.0–10.5)

## 2013-10-23 LAB — LIPASE, BLOOD: Lipase: 56 U/L (ref 11–59)

## 2013-10-23 MED ORDER — SODIUM CHLORIDE 0.9 % IV BOLUS (SEPSIS): 500.0000 mL | Freq: Once | INTRAVENOUS | Status: DC

## 2013-10-23 MED ORDER — IOHEXOL 300 MG/ML  SOLN
100.0000 mL | Freq: Once | INTRAMUSCULAR | Status: AC | PRN
  Administered 2013-10-23: 100 mL via INTRAVENOUS

## 2013-10-23 MED ORDER — IOHEXOL 300 MG/ML  SOLN
50.0000 mL | Freq: Once | INTRAMUSCULAR | Status: AC | PRN
  Administered 2013-10-23: 50 mL via ORAL

## 2013-10-23 MED ORDER — HYDROMORPHONE HCL PF 1 MG/ML IJ SOLN
1.0000 mg | Freq: Once | INTRAMUSCULAR | Status: AC
  Administered 2013-10-23: 1 mg via INTRAVENOUS
  Filled 2013-10-23: qty 1

## 2013-10-23 MED ORDER — SODIUM CHLORIDE 0.9 % IV BOLUS (SEPSIS)
1000.0000 mL | Freq: Once | INTRAVENOUS | Status: AC
  Administered 2013-10-23: 1000 mL via INTRAVENOUS

## 2013-10-23 MED ORDER — ONDANSETRON HCL 4 MG/2ML IJ SOLN
4.0000 mg | Freq: Once | INTRAMUSCULAR | Status: AC
  Administered 2013-10-23: 4 mg via INTRAVENOUS
  Filled 2013-10-23: qty 2

## 2013-10-23 MED ORDER — HYDROMORPHONE HCL PF 1 MG/ML IJ SOLN
1.0000 mg | Freq: Once | INTRAMUSCULAR | Status: AC
Start: 2013-10-23 — End: 2013-10-23
  Administered 2013-10-23: 1 mg via INTRAVENOUS
  Filled 2013-10-23: qty 1

## 2013-10-23 NOTE — ED Notes (Signed)
Patient transported to CT via stretcher per tech. 

## 2013-10-23 NOTE — ED Provider Notes (Signed)
CSN: [id number]     Arrival date & time 10/23/13  0115 History   First MD Initiated Contact with Patient 10/23/13 0126     Chief Complaint  Patient presents with  . Abdominal Pain     (Consider location/radiation/quality/duration/timing/severity/associated sxs/prior Treatment) HPI Comments: 43 year old female with history of migraines, seizure, MS, vaginal hysterectomy, gastric bypass, appendectomy presents with central abdominal pain severe. Initially intermittent however over the past week it has now become constant with no significant radiation. Patient's had milder pain episodes the past but this is more severe. Patient has had diarrhea for a week without gross blood. Patient has had nausea without vomiting. Patient denies smoking or alcohol use.  Patient is a 43 y.o. female presenting with abdominal pain. The history is provided by the patient.  Abdominal Pain Associated symptoms: diarrhea and nausea   Associated symptoms: no chest pain, no chills, no dysuria, no fever, no shortness of breath and no vomiting     Past Medical History  Diagnosis Date  . Migraine   . Trigeminal neuralgia   . Molar pregnancy   . Seizures   . PTSD (post-traumatic stress disorder)   . Pseudotumor cerebri   . MS (multiple sclerosis)   . Mitral valve prolapse    Past Surgical History  Procedure Laterality Date  . Appendectomy    . Breast surgery    . Cholecystectomy    . Gastric bypass    . Tonsillectomy    . Carpal tunnel release    . Vaginal hysterectomy    . Bilateral salpingoophorectomy     Family History  Problem Relation Age of Onset  . Cancer Maternal Aunt     Colon Cancer   . Alcohol abuse Maternal Uncle   . Cancer Paternal Grandfather     Colon Cancer   History  Substance Use Topics  . Smoking status: Never Smoker   . Smokeless tobacco: Never Used  . Alcohol Use: No   OB History   Grav Para Term Preterm Abortions TAB SAB Ect Mult Living                 Review of  Systems  Constitutional: Positive for appetite change. Negative for fever and chills.  HENT: Negative for congestion.   Eyes: Negative for visual disturbance.  Respiratory: Negative for shortness of breath.   Cardiovascular: Negative for chest pain.  Gastrointestinal: Positive for nausea, abdominal pain and diarrhea. Negative for vomiting.  Genitourinary: Negative for dysuria and flank pain.  Musculoskeletal: Negative for back pain, neck pain and neck stiffness.  Skin: Negative for rash.  Neurological: Negative for light-headedness and headaches.      Allergies  Demerol and Morphine and related  Home Medications   Prior to Admission medications   Medication Sig Start Date End Date Taking? Authorizing Provider  acetaZOLAMIDE (DIAMOX) 250 MG tablet Take 375 mg by mouth 2 (two) times daily.     Historical Provider, MD  acyclovir (ZOVIRAX) 800 MG tablet  01/29/13   Historical Provider, MD  carisoprodol (SOMA) 350 MG tablet  12/28/12   Historical Provider, MD  cetirizine (ZYRTEC) 10 MG tablet Take 10 mg by mouth daily.      Historical Provider, MD  ciprofloxacin (CIPRO) 500 MG tablet Take 1 tablet (500 mg total) by mouth 2 (two) times daily. 10/21/13 10/28/13  Gillian Scarce, MD  clonazePAM (KLONOPIN) 1 MG tablet Take 1 mg by mouth daily.    Historical Provider, MD  Dimethyl Fumarate (TECFIDERA) 240 MG  CPDR Take by mouth.    Historical Provider, MD  fluticasone Aleda Grana(FLONASE) 50 MCG/ACT nasal spray  12/21/12   Historical Provider, MD  gabapentin (NEURONTIN) 600 MG tablet Take 600 mg by mouth daily.      Historical Provider, MD  LamoTRIgine 300 MG TB24 200 mg 2 (two) times daily.  02/01/13   Historical Provider, MD  meclizine (ANTIVERT) 25 MG tablet  01/25/13   Historical Provider, MD  Multiple Vitamin (MULTIVITAMIN) tablet Take 1 tablet by mouth daily.    Historical Provider, MD  NON FORMULARY Estradiol cream as directed (daily)    Historical Provider, MD  oxyCODONE-acetaminophen (PERCOCET)  5-325 MG per tablet Take 2 tablets by mouth every 4 (four) hours as needed. 06/22/13   Geoffery Lyonsouglas Delo, MD  promethazine (PHENERGAN) 25 MG tablet Take 25 mg by mouth every 6 (six) hours as needed.      Historical Provider, MD  SUMAtriptan (IMITREX) 100 MG tablet Take 100 mg by mouth every 2 (two) hours as needed.      Historical Provider, MD   BP 110/65  Pulse 126  Temp(Src) 98.5 F (36.9 C) (Oral)  Resp 20  Ht 5\' 6"  (1.676 m)  Wt 179 lb (81.194 kg)  BMI 28.91 kg/m2  SpO2 99% Physical Exam  Nursing note and vitals reviewed. Constitutional: She is oriented to person, place, and time. She appears well-developed and well-nourished.  HENT:  Head: Normocephalic and atraumatic.  Mild dry mucous membranes  Eyes: Conjunctivae are normal. Right eye exhibits no discharge. Left eye exhibits no discharge.  Neck: Normal range of motion. Neck supple. No tracheal deviation present.  Cardiovascular: Normal rate and regular rhythm.   Pulmonary/Chest: Effort normal and breath sounds normal.  Abdominal: Soft. She exhibits no distension. There is tenderness (epigastric and central). There is no guarding.  Musculoskeletal: She exhibits no edema.  Neurological: She is alert and oriented to person, place, and time.  Skin: Skin is warm. No rash noted.  Psychiatric: She has a normal mood and affect.    ED Course  Procedures (including critical care time) Labs Review Labs Reviewed  CBC WITH DIFFERENTIAL - Abnormal; Notable for the following:    WBC 13.7 (*)    RBC 3.79 (*)    Hemoglobin 10.8 (*)    HCT 34.9 (*)    Monocytes Relative 13 (*)    Lymphs Abs 4.9 (*)    Monocytes Absolute 1.8 (*)    All other components within normal limits  COMPREHENSIVE METABOLIC PANEL - Abnormal; Notable for the following:    Potassium 3.5 (*)    Total Bilirubin <0.2 (*)    GFR calc non Af Amer 68 (*)    GFR calc Af Amer 79 (*)    All other components within normal limits  URINALYSIS, ROUTINE W REFLEX MICROSCOPIC   LIPASE, BLOOD    Imaging Review Ct Abdomen Pelvis W Contrast  10/23/2013   CLINICAL DATA:  Central abdominal pain. History of bypass 06/22/2013  EXAM: CT ABDOMEN AND PELVIS WITH CONTRAST  TECHNIQUE: Multidetector CT imaging of the abdomen and pelvis was performed using the standard protocol following bolus administration of intravenous contrast.  CONTRAST:  50mL OMNIPAQUE IOHEXOL 300 MG/ML SOLN, 100mL OMNIPAQUE IOHEXOL 300 MG/ML SOLN  COMPARISON:  06/22/2013  FINDINGS: BODY WALL: Unremarkable.  LOWER CHEST: Unremarkable.  ABDOMEN/PELVIS:  Liver: Stable subcentimeter cyst or other benign process in the central right liver. No focal concerning abnormality.  Biliary: Cholecystectomy which accounts for mild enlargement of the extrahepatic biliary  tree.  Pancreas: Dilated main duct which has gradually enlarged over multiple years, suggesting stricture. No evidence of mass or active inflammation.  Spleen: Unremarkable.  Adrenals: Unremarkable.  Kidneys and ureters: No hydronephrosis or stone.  Bladder: Unremarkable.  Reproductive: Hysterectomy.  No evidence of adnexal pathology.  Bowel: Status post gastric bypass. No bowel obstruction. No inflammatory changes at the gastroenteric anastomosis. No bowel perforation. Appendectomy changes. There is prominent soft tissue at the ileocecal valve. No mass present in this location on recent study, the density could represent stool or a transient intussusception.  Retroperitoneum: No mass or adenopathy.  Peritoneum: No ascites or pneumoperitoneum.  Vascular: No acute abnormality.  OSSEOUS: No acute abnormalities.  IMPRESSION: 1. No acute intra-abdominal findings. 2. Postsurgical and incidental findings are described above.   Electronically Signed   By: Tiburcio Pea M.D.   On: 10/23/2013 03:06     EKG Interpretation None      MDM   Final diagnoses:  Generalized abdominal pain  History of Roux-en-Y gastric bypass   Patient with complicated abdominal surgery  history presents with worsening abdominal pain for 1 week. Plan for blood work and CT abdomen pelvis for further delineation of pain. Discussed differential diagnoses with patient including adhesions, partial bowel obstruction, pancreatitis, other. Pain meds antiemetics IV fluids.  Patient required multiple IV pain medicines to control pain. CT scan results reviewed with patient no acute findings.  Patient still having 8/10 pain and requesting to be transferred to [facility] to see the bypass surgery team. Discussed the case with GI surgery on call who recommended ED ED transfer and they'll evaluate in ER.  Discussed the case with ER physician Dr. Leighton Parody who accepted the patient. Updated patient and recommended significant other meet her in the emergency room at [facility].  Filed Vitals:   10/23/13 0122 10/23/13 0259 10/23/13 0506  BP: 110/65  109/67  Pulse: 126 98 91  Temp: 98.5 F (36.9 C)  98.7 F (37.1 C)  TempSrc: Oral  Oral  Resp: 20 18 18   Height: 5\' 6"  (1.676 m)    Weight: 179 lb (81.194 kg)    SpO2: 99% 100% 97%        Enid Skeens, MD 10/23/13 [phone]

## 2013-10-23 NOTE — ED Notes (Signed)
Pt c/o center abdominal pain around umbilicus area, states had a laparoscope 3/15; pt c/o diarrhea x1wk

## 2013-10-23 NOTE — ED Notes (Signed)
Additional pillow offered per pt request.

## 2013-11-16 ENCOUNTER — Emergency Department (HOSPITAL_BASED_OUTPATIENT_CLINIC_OR_DEPARTMENT_OTHER)
Admission: EM | Admit: 2013-11-16 | Discharge: 2013-11-17 | Disposition: A | Discharge status: Home / Self Care | Payer: Federal, State, Local not specified - PPO | Attending: Emergency Medicine | Admitting: Emergency Medicine

## 2013-11-16 ENCOUNTER — Emergency Department (HOSPITAL_BASED_OUTPATIENT_CLINIC_OR_DEPARTMENT_OTHER): Payer: Federal, State, Local not specified - PPO

## 2013-11-16 ENCOUNTER — Encounter (HOSPITAL_BASED_OUTPATIENT_CLINIC_OR_DEPARTMENT_OTHER): Payer: Self-pay | Admitting: Emergency Medicine

## 2013-11-16 DIAGNOSIS — Z8679 Personal history of other diseases of the circulatory system: Secondary | ICD-10-CM | POA: Insufficient documentation

## 2013-11-16 DIAGNOSIS — R569 Unspecified convulsions: Secondary | ICD-10-CM | POA: Diagnosis present

## 2013-11-16 DIAGNOSIS — Y9289 Other specified places as the place of occurrence of the external cause: Secondary | ICD-10-CM | POA: Diagnosis not present

## 2013-11-16 DIAGNOSIS — Y9389 Activity, other specified: Secondary | ICD-10-CM | POA: Diagnosis not present

## 2013-11-16 DIAGNOSIS — Z79899 Other long term (current) drug therapy: Secondary | ICD-10-CM | POA: Diagnosis not present

## 2013-11-16 DIAGNOSIS — G43909 Migraine, unspecified, not intractable, without status migrainosus: Secondary | ICD-10-CM | POA: Diagnosis not present

## 2013-11-16 DIAGNOSIS — G40909 Epilepsy, unspecified, not intractable, without status epilepticus: Secondary | ICD-10-CM | POA: Diagnosis not present

## 2013-11-16 DIAGNOSIS — G500 Trigeminal neuralgia: Secondary | ICD-10-CM | POA: Diagnosis not present

## 2013-11-16 DIAGNOSIS — IMO0002 Reserved for concepts with insufficient information to code with codable children: Secondary | ICD-10-CM | POA: Insufficient documentation

## 2013-11-16 DIAGNOSIS — F4310 Post-traumatic stress disorder, unspecified: Secondary | ICD-10-CM | POA: Diagnosis not present

## 2013-11-16 DIAGNOSIS — G932 Benign intracranial hypertension: Secondary | ICD-10-CM | POA: Insufficient documentation

## 2013-11-16 DIAGNOSIS — G35 Multiple sclerosis: Secondary | ICD-10-CM | POA: Diagnosis not present

## 2013-11-16 DIAGNOSIS — W1809XA Striking against other object with subsequent fall, initial encounter: Secondary | ICD-10-CM | POA: Diagnosis not present

## 2013-11-16 DIAGNOSIS — S161XXA Strain of muscle, fascia and tendon at neck level, initial encounter: Secondary | ICD-10-CM

## 2013-11-16 NOTE — ED Provider Notes (Addendum)
CSN: [id number]     Arrival date & time 11/16/13  2049 History  This chart was scribed for Hanley Seamen, MD by Roxy Cedar, ED Scribe. This patient was seen in room MH12/MH12 and the patient's care was started at 11:34 PM.   Chief Complaint  Patient presents with  . Seizure    The history is provided by the patient. No language interpreter was used.    HPI Comments: Joyce Lang is a 43 y.o. female who presents to the Emergency Department with a history of "atypical seizures" that cause her to lose consciousness suddenly and unexpectedly. She had one that occurred occurred today about 3 PM. When she fell she hit the right side of her face. She is having mild to moderate pain on the right side of her face. Patient was concerned because her friends said she was slurring her words after the seizure.   Past Medical History  Diagnosis Date  . Migraine   . Trigeminal neuralgia   . Molar pregnancy   . Seizures   . PTSD (post-traumatic stress disorder)   . Pseudotumor cerebri   . MS (multiple sclerosis)   . Mitral valve prolapse    Past Surgical History  Procedure Laterality Date  . Appendectomy    . Breast surgery    . Cholecystectomy    . Gastric bypass    . Tonsillectomy    . Carpal tunnel release    . Vaginal hysterectomy    . Bilateral salpingoophorectomy     Family History  Problem Relation Age of Onset  . Cancer Maternal Aunt     Colon Cancer   . Alcohol abuse Maternal Uncle   . Cancer Paternal Grandfather     Colon Cancer   History  Substance Use Topics  . Smoking status: Never Smoker   . Smokeless tobacco: Never Used  . Alcohol Use: No   OB History   Grav Para Term Preterm Abortions TAB SAB Ect Mult Living                 Review of Systems  A complete 10 system review of systems was obtained and all systems are negative except as noted in the HPI and PMH.   Allergies  Demerol and Morphine and related  Home Medications   Prior to Admission  medications   Medication Sig Start Date End Date Taking? Authorizing Provider  acetaZOLAMIDE (DIAMOX) 250 MG tablet Take 375 mg by mouth 2 (two) times daily.     Historical Provider, MD  acyclovir (ZOVIRAX) 800 MG tablet  01/29/13   Historical Provider, MD  carisoprodol (SOMA) 350 MG tablet  12/28/12   Historical Provider, MD  cetirizine (ZYRTEC) 10 MG tablet Take 10 mg by mouth daily.      Historical Provider, MD  clonazePAM (KLONOPIN) 1 MG tablet Take 1 mg by mouth daily.    Historical Provider, MD  Dimethyl Fumarate (TECFIDERA) 240 MG CPDR Take by mouth.    Historical Provider, MD  fluticasone Aleda Grana) 50 MCG/ACT nasal spray  12/21/12   Historical Provider, MD  gabapentin (NEURONTIN) 600 MG tablet Take 600 mg by mouth daily.      Historical Provider, MD  LamoTRIgine 300 MG TB24 200 mg 2 (two) times daily.  02/01/13   Historical Provider, MD  meclizine (ANTIVERT) 25 MG tablet  01/25/13   Historical Provider, MD  Multiple Vitamin (MULTIVITAMIN) tablet Take 1 tablet by mouth daily.    Historical Provider, MD  Clent Demark  Estradiol cream as directed (daily)    Historical Provider, MD  oxyCODONE-acetaminophen (PERCOCET) 5-325 MG per tablet Take 2 tablets by mouth every 4 (four) hours as needed. 06/22/13   [name], MD  promethazine (PHENERGAN) 25 MG tablet Take 25 mg by mouth every 6 (six) hours as needed.      Historical Provider, MD  SUMAtriptan (IMITREX) 100 MG tablet Take 100 mg by mouth every 2 (two) hours as needed.      Historical Provider, MD   Triage Vitals: BP 113/86  Pulse 87  Temp(Src) 97.9 F (36.6 C) (Oral)  Resp 16  Ht  (1.676 m)  Wt 176 lb (79.833 kg)  BMI 28.42 kg/m2  SpO2 100%  Physical Exam  Nursing note and vitals reviewed.  General: Well-developed, well-nourished female in no acute distress; appearance consistent with age of record HENT: normocephalic; atraumatic. Mild tenderness to right side of face. No ecchymosis, swelling or deformity. Eyes: pupils  equal, round and reactive to light; extraocular muscles intact Neck: supple Heart: regular rate and rhythm; no murmurs, rubs or gallops Lungs: clear to auscultation bilaterally Abdomen: soft; nondistended; nontender; no masses or hepatosplenomegaly; bowel sounds present Extremities: No deformity; full range of motion; pulses normal Neurologic: Sleepy but readily aroused; oriented x4; mild dysarthria; motor function intact in all extremities and symmetric; no facial droop Skin: Warm and dry Psychiatric: Normal mood and affect Musculoskeletal: Cervical spine tenderness noted.  ED Course  Procedures (including critical care time)  DIAGNOSTIC STUDIES: Oxygen Saturation is 100% on RA, normal by my interpretation.    COORDINATION OF CARE: 11:39 PM- Discussed plans to order diagnostic imaging of cervical spine. Pt advised of plan for treatment and pt agrees.  MDM   Nursing notes and vitals signs, including pulse oximetry, reviewed.  Summary of this visit's results, reviewed by myself:  Imaging Studies: Dg Cervical Spine Complete  11/17/2013   CLINICAL DATA:  Seizure, posterior cervical spine pain.  EXAM: CERVICAL SPINE  4+ VIEWS  COMPARISON:  Cervical spine radiograph August 08, 2013  FINDINGS: Cervical vertebral bodies and posterior elements appear intact and aligned to the inferior endplate of C7, the most caudal well visualized level. Straightened cervical lordosis. Mild similar C4-5, C5-6 disc height loss most consistent with disc degeneration. No neural foraminal narrowing. No destructive bony lesions. Lateral masses in alignment. Prevertebral and paraspinal soft tissue planes are nonsuspicious.  IMPRESSION: No acute fracture deformity or malalignment.   Electronically Signed   By: Awilda Metro   On: 11/17/2013 01:06   The patient was advised that any seizure disorder or other disorder that causes unexpected loss of consciousness means that she is not legally able to drive a motor  vehicle and the state of [place]. She was advised that should she drive and have such that she could be legally liable for the accident and any damages.   I personally performed the services described in this documentation, which was scribed in my presence. The recorded information has been reviewed and is accurate.  Hanley Seamen, MD 11/17/13 0110  Hanley Seamen, MD 11/17/13 1610

## 2013-11-16 NOTE — ED Notes (Signed)
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 She had one at home and hit the right side of her face. No injury visible. She states she can control when she is going to have one that is why she is able to drive herself here. Alert ambulatory at triage.

## 2013-11-16 NOTE — ED Notes (Signed)
Patient transported to X-ray 

## 2013-11-17 NOTE — Discharge Instructions (Signed)
It is illegal to drive in [place] if you have a known seizure disorder, or other condition that causes you to have sudden loss of consciousness. If you are driving and have such an attack, you may be held legally liable for any damages caused.

## 2013-12-19 ENCOUNTER — Encounter (HOSPITAL_BASED_OUTPATIENT_CLINIC_OR_DEPARTMENT_OTHER): Payer: Self-pay | Admitting: Emergency Medicine

## 2013-12-19 ENCOUNTER — Emergency Department (HOSPITAL_BASED_OUTPATIENT_CLINIC_OR_DEPARTMENT_OTHER)
Admission: EM | Admit: 2013-12-19 | Discharge: 2013-12-20 | Disposition: A | Discharge status: Home / Self Care | Payer: Federal, State, Local not specified - PPO | Attending: Emergency Medicine | Admitting: Emergency Medicine

## 2013-12-19 DIAGNOSIS — Z8659 Personal history of other mental and behavioral disorders: Secondary | ICD-10-CM | POA: Insufficient documentation

## 2013-12-19 DIAGNOSIS — Z9884 Bariatric surgery status: Secondary | ICD-10-CM | POA: Diagnosis not present

## 2013-12-19 DIAGNOSIS — Z90710 Acquired absence of both cervix and uterus: Secondary | ICD-10-CM | POA: Insufficient documentation

## 2013-12-19 DIAGNOSIS — Z8719 Personal history of other diseases of the digestive system: Secondary | ICD-10-CM | POA: Insufficient documentation

## 2013-12-19 DIAGNOSIS — Z90722 Acquired absence of ovaries, bilateral: Secondary | ICD-10-CM | POA: Diagnosis not present

## 2013-12-19 DIAGNOSIS — G43909 Migraine, unspecified, not intractable, without status migrainosus: Secondary | ICD-10-CM | POA: Insufficient documentation

## 2013-12-19 DIAGNOSIS — G40909 Epilepsy, unspecified, not intractable, without status epilepticus: Secondary | ICD-10-CM | POA: Diagnosis not present

## 2013-12-19 DIAGNOSIS — G500 Trigeminal neuralgia: Secondary | ICD-10-CM | POA: Diagnosis not present

## 2013-12-19 DIAGNOSIS — Z79899 Other long term (current) drug therapy: Secondary | ICD-10-CM | POA: Insufficient documentation

## 2013-12-19 DIAGNOSIS — G35 Multiple sclerosis: Secondary | ICD-10-CM | POA: Insufficient documentation

## 2013-12-19 DIAGNOSIS — R109 Unspecified abdominal pain: Secondary | ICD-10-CM | POA: Insufficient documentation

## 2013-12-19 DIAGNOSIS — Z8679 Personal history of other diseases of the circulatory system: Secondary | ICD-10-CM | POA: Insufficient documentation

## 2013-12-19 DIAGNOSIS — Z9049 Acquired absence of other specified parts of digestive tract: Secondary | ICD-10-CM | POA: Insufficient documentation

## 2013-12-19 DIAGNOSIS — Z7951 Long term (current) use of inhaled steroids: Secondary | ICD-10-CM | POA: Diagnosis not present

## 2013-12-19 MED ORDER — SODIUM CHLORIDE 0.9 % IV SOLN
INTRAVENOUS | Status: DC
  Administered 2013-12-20: via INTRAVENOUS

## 2013-12-19 MED ORDER — FENTANYL CITRATE 0.05 MG/ML IJ SOLN
100.0000 ug | Freq: Once | INTRAMUSCULAR | Status: AC
  Administered 2013-12-20: 100 ug via INTRAVENOUS
  Filled 2013-12-19: qty 2

## 2013-12-19 MED ORDER — ONDANSETRON HCL 4 MG/2ML IJ SOLN
4.0000 mg | Freq: Once | INTRAMUSCULAR | Status: AC
  Administered 2013-12-20: 4 mg via INTRAVENOUS
  Filled 2013-12-19: qty 2

## 2013-12-19 NOTE — ED Provider Notes (Addendum)
CSN: [id number]     Arrival date & time 12/19/13  2300 History  This chart was scribed for Joyce Seamen, MD by Roxy Cedar, ED Scribe. This patient was seen in room MH09/MH09 and the patient's care was started at 11:40 PM.   Chief Complaint  Patient presents with  . Abdominal Pain   Patient is a 43 y.o. female presenting with abdominal pain. The history is provided by the patient. No language interpreter was used.  Abdominal Pain  HPI Comments: Joyce Lang is a 43 y.o. female, with a history of gastric bypass in 2008 at [facility], and seizures, who presents to the Emergency Department complaining of periumbilical pain for the past month and a half. This evening it became severe enough to have her doubled over and is characterized as feeling like it is "pulling into her back". Patient has been diagnosed with an internal hernia in 2010. She was prescribed Prilosec for her symptoms. Patient states that her pain today is similar to the symptoms she had in 2010. She has associated decreased appetite and weight loss. She denies associated fever, chills. nausea, vomiting, diarrhea or constipation.   Past Medical History  Diagnosis Date  . Migraine   . Trigeminal neuralgia   . Molar pregnancy   . Seizures   . PTSD (post-traumatic stress disorder)   . Pseudotumor cerebri   . MS (multiple sclerosis)   . Mitral valve prolapse    Past Surgical History  Procedure Laterality Date  . Appendectomy    . Breast surgery    . Cholecystectomy    . Gastric bypass    . Tonsillectomy    . Carpal tunnel release    . Vaginal hysterectomy    . Bilateral salpingoophorectomy     Family History  Problem Relation Age of Onset  . Cancer Maternal Aunt     Colon Cancer   . Alcohol abuse Maternal Uncle   . Cancer Paternal Grandfather     Colon Cancer   History  Substance Use Topics  . Smoking status: Never Smoker   . Smokeless tobacco: Never Used  . Alcohol Use: No   OB History   Grav Para Term  Preterm Abortions TAB SAB Ect Mult Living                 Review of Systems  Gastrointestinal: Positive for abdominal pain.    A complete 10 system review of systems was obtained and all systems are negative except as noted in the HPI and PMH.  Allergies  Demerol and Morphine and related  Home Medications   Prior to Admission medications   Medication Sig Start Date End Date Taking? Authorizing Provider  acetaZOLAMIDE (DIAMOX) 250 MG tablet Take 375 mg by mouth 2 (two) times daily.     Historical Provider, MD  acyclovir (ZOVIRAX) 800 MG tablet  01/29/13   Historical Provider, MD  carisoprodol (SOMA) 350 MG tablet  12/28/12   Historical Provider, MD  cetirizine (ZYRTEC) 10 MG tablet Take 10 mg by mouth daily.      Historical Provider, MD  clonazePAM (KLONOPIN) 1 MG tablet Take 1 mg by mouth daily.    Historical Provider, MD  Dimethyl Fumarate (TECFIDERA) 240 MG CPDR Take by mouth.    Historical Provider, MD  fluticasone Aleda Grana) 50 MCG/ACT nasal spray  12/21/12   Historical Provider, MD  gabapentin (NEURONTIN) 600 MG tablet Take 600 mg by mouth daily.      Historical Provider, MD  LamoTRIgine  300 MG TB24 200 mg 2 (two) times daily.  02/01/13   Historical Provider, MD  meclizine (ANTIVERT) 25 MG tablet  01/25/13   Historical Provider, MD  Multiple Vitamin (MULTIVITAMIN) tablet Take 1 tablet by mouth daily.    Historical Provider, MD  NON FORMULARY Estradiol cream as directed (daily)    Historical Provider, MD  oxyCODONE-acetaminophen (PERCOCET) 5-325 MG per tablet Take 2 tablets by mouth every 4 (four) hours as needed. 06/22/13   [name], MD  promethazine (PHENERGAN) 25 MG tablet Take 25 mg by mouth every 6 (six) hours as needed.      Historical Provider, MD  SUMAtriptan (IMITREX) 100 MG tablet Take 100 mg by mouth every 2 (two) hours as needed.      Historical Provider, MD   Triage Vitals: BP 119/70  Pulse 119  Temp(Src) 98.3 F (36.8 C) (Oral)  Resp 23  Ht 5\' 6"  (1.676 m)   Wt 174 lb (78.926 kg)  BMI 28.10 kg/m2  SpO2 100%  Physical Exam  Nursing note and vitals reviewed.  General: Well-developed, well-nourished female in no acute distress; appearance consistent with age of record HENT: normocephalic; atraumatic Eyes: pupils equal, round and reactive to light; extraocular muscles intact Neck: supple Heart: regular rate and rhythm; no murmurs, rubs or gallops Lungs: clear to auscultation bilaterally Abdomen: soft; nondistended; upper and lower midabdominal tenderness; no masses or hepatosplenomegaly; bowel sounds present Extremities: No deformity; full range of motion; pulses normal; left ankle an orthotic device Neurologic: Awake, alert and oriented; motor function intact in all extremities and symmetric; no facial droop Skin: Warm and dry Psychiatric: Normal mood and affect  ED Course  Procedures (including critical care time)  DIAGNOSTIC STUDIES: Oxygen Saturation is 100% on RA, normal by my interpretation.    COORDINATION OF CARE: 11:51 PM- Discussed plans to obtain diagnostic lab work. Pt advised of plan for treatment and pt agrees.  MDM   Nursing notes and vitals signs, including pulse oximetry, reviewed.  Summary of this visit's results, reviewed by myself:  Labs:  Results for orders placed during the hospital encounter of 12/19/13 (from the past 24 hour(s))  CBC WITH DIFFERENTIAL     Status: Abnormal   Collection Time    12/19/13 11:59 PM      Result Value Ref Range   WBC 9.6  4.0 - 10.5 K/uL   RBC 3.66 (*) 3.87 - 5.11 MIL/uL   Hemoglobin 10.2 (*) 12.0 - 15.0 g/dL   HCT 40.9 (*) 81.1 - 91.4 %   MCV 89.3  78.0 - 100.0 fL   MCH 27.9  26.0 - 34.0 pg   MCHC 31.2  30.0 - 36.0 g/dL   RDW 78.2  95.6 - 21.3 %   Platelets 248  150 - 400 K/uL   Neutrophils Relative % 58  43 - 77 %   Neutro Abs 5.6  1.7 - 7.7 K/uL   Lymphocytes Relative 27  12 - 46 %   Lymphs Abs 2.6  0.7 - 4.0 K/uL   Monocytes Relative 14 (*) 3 - 12 %   Monocytes  Absolute 1.3 (*) 0.1 - 1.0 K/uL   Eosinophils Relative 1  0 - 5 %   Eosinophils Absolute 0.1  0.0 - 0.7 K/uL   Basophils Relative 0  0 - 1 %   Basophils Absolute 0.0  0.0 - 0.1 K/uL  LIPASE, BLOOD     Status: None   Collection Time    12/19/13 11:59 PM  Result Value Ref Range   Lipase 38  11 - 59 U/L  COMPREHENSIVE METABOLIC PANEL     Status: Abnormal   Collection Time    12/20/13 12:01 AM      Result Value Ref Range   Sodium 142  137 - 147 mEq/L   Potassium 3.6 (*) 3.7 - 5.3 mEq/L   Chloride 110  96 - 112 mEq/L   CO2 17 (*) 19 - 32 mEq/L   Glucose, Bld 88  70 - 99 mg/dL   BUN 9  6 - 23 mg/dL   Creatinine, Ser 1.82  0.50 - 1.10 mg/dL   Calcium 8.6  8.4 - 99.3 mg/dL   Total Protein 6.6  6.0 - 8.3 g/dL   Albumin 3.6  3.5 - 5.2 g/dL   AST 12  0 - 37 U/L   ALT 9  0 - 35 U/L   Alkaline Phosphatase 60  39 - 117 U/L   Total Bilirubin <0.2 (*) 0.3 - 1.2 mg/dL   GFR calc non Af Amer 77 (*) >90 mL/min   GFR calc Af Amer 89 (*) >90 mL/min   Anion gap 15  5 - 15    Imaging Studies: Ct Abdomen Pelvis W Contrast  12/20/2013   CLINICAL DATA:  Periumbilical pain over the past month. Acutely worsening in the past few days. Decreased appetite. Weight loss. History of gastric bypass.  EXAM: CT ABDOMEN AND PELVIS WITH CONTRAST  TECHNIQUE: Multidetector CT imaging of the abdomen and pelvis was performed using the standard protocol following bolus administration of intravenous contrast.  CONTRAST:  36mL OMNIPAQUE IOHEXOL 300 MG/ML SOLN, OMNIPAQUE IOHEXOL 300 MG/ML SOLN  COMPARISON:  10/23/2013, 06/22/2013.  FINDINGS: The lung bases are clear.  Subcentimeter cysts in the dome of the liver is stable since previous studies. No other focal liver lesions. Surgical absence of the gallbladder. Mild extrahepatic bile duct dilatation is likely normal for postoperative physiology. Spleen size is normal. Mild diffuse pancreatic ductal dilatation. Similar appearance to previous study. No pancreatic mass  is identified. Adrenal glands, kidneys, abdominal aorta, inferior vena cava, and retroperitoneal lymph nodes are unremarkable. Postoperative changes in the stomach consistent with history of bypass. Stomach is decompressed. Small bowel are not abnormally distended. Stool-filled colon without distention. No free fluid or free air in the abdomen.  Pelvis: Surgical absence of the appendix. Surgical absence of the uterus. No abnormal pelvic mass or lymphadenopathy. No free or loculated pelvic fluid collections. Bladder wall is not thickened. No inflammatory changes demonstrated in the sigmoid colon. No destructive bone lesions.  IMPRESSION: No acute process demonstrated in the abdomen or pelvis. Postoperative changes as before. Mild biliary ductal dilatation is likely normal for post cholecystectomy physiology. Pancreatic ductal dilatation is also demonstrated, unchanged since prior study. The etiology is indeterminate.   Electronically Signed   By: Burman Nieves M.D.   On: 12/20/2013 01:58   2:24 AM Patient states that last time she had similar symptoms she was transferred to [facility] where she reports upper and lower endoscopies were unremarkable and she was placed on Prilosec and Bentyl without significant improvement. She was also told an internal hernia and/or scar tissue was suspected but she never developed a bowel obstruction. An ex-lap has been suggested but she as been reticent to proceed with this. She was advised of her labs and CT findings in the ED this visit. She is comfortable going home with symptomatic treatment and will contact [facility] later today for followup. She was  advised that she is always welcome to return to the ED if symptoms worsen or change.  I personally performed the services described in this documentation, which was scribed in my presence. The recorded information has been reviewed and is accurate.   Joyce SeamenJohn L Kentravious Lipford, MD 12/20/13 0225  Joyce SeamenJohn L Stefano Trulson, MD 12/20/13 [phone]  Joyce SeamenJohn L  Lenise Jr, MD 12/20/13 [phone]

## 2013-12-19 NOTE — ED Notes (Signed)
Pt reports [facility] by Dr. Shelbie Proctor Farrell. Has since been Dx with internal hernias and having recurrent abd pain

## 2013-12-20 ENCOUNTER — Emergency Department (HOSPITAL_BASED_OUTPATIENT_CLINIC_OR_DEPARTMENT_OTHER): Payer: Federal, State, Local not specified - PPO

## 2013-12-20 LAB — CBC WITH DIFFERENTIAL/PLATELET
BASOS ABS: 0 10*3/uL (ref 0.0–0.1)
BASOS PCT: 0 % (ref 0–1)
EOS ABS: 0.1 10*3/uL (ref 0.0–0.7)
EOS PCT: 1 % (ref 0–5)
HEMATOCRIT: 32.7 % — AB (ref 36.0–46.0)
Hemoglobin: 10.2 g/dL — ABNORMAL LOW (ref 12.0–15.0)
Lymphocytes Relative: 27 % (ref 12–46)
Lymphs Abs: 2.6 10*3/uL (ref 0.7–4.0)
MCH: 27.9 pg (ref 26.0–34.0)
MCHC: 31.2 g/dL (ref 30.0–36.0)
MCV: 89.3 fL (ref 78.0–100.0)
Monocytes Absolute: 1.3 10*3/uL — ABNORMAL HIGH (ref 0.1–1.0)
Monocytes Relative: 14 % — ABNORMAL HIGH (ref 3–12)
Neutro Abs: 5.6 10*3/uL (ref 1.7–7.7)
Neutrophils Relative %: 58 % (ref 43–77)
PLATELETS: 248 10*3/uL (ref 150–400)
RBC: 3.66 MIL/uL — ABNORMAL LOW (ref 3.87–5.11)
RDW: 15.4 % (ref 11.5–15.5)
WBC: 9.6 10*3/uL (ref 4.0–10.5)

## 2013-12-20 LAB — COMPREHENSIVE METABOLIC PANEL
ALBUMIN: 3.6 g/dL (ref 3.5–5.2)
ALT: 9 U/L (ref 0–35)
ANION GAP: 15 (ref 5–15)
AST: 12 U/L (ref 0–37)
Alkaline Phosphatase: 60 U/L (ref 39–117)
BUN: 9 mg/dL (ref 6–23)
CALCIUM: 8.6 mg/dL (ref 8.4–10.5)
CHLORIDE: 110 meq/L (ref 96–112)
CO2: 17 mEq/L — ABNORMAL LOW (ref 19–32)
CREATININE: 0.9 mg/dL (ref 0.50–1.10)
GFR calc Af Amer: 89 mL/min — ABNORMAL LOW (ref >90)
GFR calc non Af Amer: 77 mL/min — ABNORMAL LOW (ref >90)
Glucose, Bld: 88 mg/dL (ref 70–99)
Potassium: 3.6 mEq/L — ABNORMAL LOW (ref 3.7–5.3)
Sodium: 142 mEq/L (ref 137–147)
TOTAL PROTEIN: 6.6 g/dL (ref 6.0–8.3)

## 2013-12-20 LAB — LIPASE, BLOOD: LIPASE: 38 U/L (ref 11–59)

## 2013-12-20 MED ORDER — ONDANSETRON HCL 8 MG PO TABS: 8.0000 mg | ORAL_TABLET | Freq: Three times a day (TID) | ORAL | Status: DC | PRN

## 2013-12-20 MED ORDER — HYDROCODONE-ACETAMINOPHEN 5-325 MG PO TABS: 1.0000 | ORAL_TABLET | Freq: Four times a day (QID) | ORAL | Status: DC | PRN

## 2013-12-20 MED ORDER — FENTANYL CITRATE 0.05 MG/ML IJ SOLN
100.0000 ug | Freq: Once | INTRAMUSCULAR | Status: AC
  Administered 2013-12-20: 100 ug via INTRAVENOUS

## 2013-12-20 MED ORDER — IOHEXOL 300 MG/ML  SOLN
100.0000 mL | Freq: Once | INTRAMUSCULAR | Status: AC | PRN
  Administered 2013-12-20: 100 mL via INTRAVENOUS

## 2013-12-20 MED ORDER — FENTANYL CITRATE 0.05 MG/ML IJ SOLN
100.0000 ug | Freq: Once | INTRAMUSCULAR | Status: DC
  Filled 2013-12-20: qty 2

## 2013-12-20 MED ORDER — IOHEXOL 300 MG/ML  SOLN
50.0000 mL | Freq: Once | INTRAMUSCULAR | Status: AC | PRN
  Administered 2013-12-20: 50 mL via ORAL

## 2013-12-20 MED ORDER — HYDROMORPHONE HCL 4 MG PO TABS: 2.0000 mg | ORAL_TABLET | ORAL | Status: DC | PRN

## 2013-12-20 MED ORDER — FENTANYL CITRATE 0.05 MG/ML IJ SOLN
100.0000 ug | Freq: Once | INTRAMUSCULAR | Status: AC
  Administered 2013-12-20: 100 ug via INTRAVENOUS
  Filled 2013-12-20: qty 2

## 2013-12-20 MED ORDER — MORPHINE SULFATE 4 MG/ML IJ SOLN: 4.0000 mg | Freq: Once | INTRAMUSCULAR | Status: DC

## 2013-12-20 NOTE — ED Notes (Signed)
The pt received a total of 300 mcg fentanyl this visit and the 0308  Dose would have made 400 mcg and was not given. See MAR for time of dose that were given.

## 2013-12-20 NOTE — Discharge Instructions (Signed)

## 2014-01-25 ENCOUNTER — Emergency Department (HOSPITAL_BASED_OUTPATIENT_CLINIC_OR_DEPARTMENT_OTHER)
Admission: EM | Admit: 2014-01-25 | Discharge: 2014-01-26 | Disposition: A | Discharge status: Home / Self Care | Payer: Federal, State, Local not specified - PPO | Attending: Emergency Medicine | Admitting: Emergency Medicine

## 2014-01-25 ENCOUNTER — Encounter (HOSPITAL_BASED_OUTPATIENT_CLINIC_OR_DEPARTMENT_OTHER): Payer: Self-pay | Admitting: *Deleted

## 2014-01-25 DIAGNOSIS — Z90710 Acquired absence of both cervix and uterus: Secondary | ICD-10-CM | POA: Insufficient documentation

## 2014-01-25 DIAGNOSIS — Z4801 Encounter for change or removal of surgical wound dressing: Secondary | ICD-10-CM | POA: Insufficient documentation

## 2014-01-25 DIAGNOSIS — Z7951 Long term (current) use of inhaled steroids: Secondary | ICD-10-CM | POA: Insufficient documentation

## 2014-01-25 DIAGNOSIS — F4310 Post-traumatic stress disorder, unspecified: Secondary | ICD-10-CM | POA: Diagnosis not present

## 2014-01-25 DIAGNOSIS — Z90722 Acquired absence of ovaries, bilateral: Secondary | ICD-10-CM | POA: Insufficient documentation

## 2014-01-25 DIAGNOSIS — G35 Multiple sclerosis: Secondary | ICD-10-CM | POA: Insufficient documentation

## 2014-01-25 DIAGNOSIS — Z79899 Other long term (current) drug therapy: Secondary | ICD-10-CM | POA: Insufficient documentation

## 2014-01-25 DIAGNOSIS — Z9884 Bariatric surgery status: Secondary | ICD-10-CM | POA: Diagnosis not present

## 2014-01-25 DIAGNOSIS — Z9049 Acquired absence of other specified parts of digestive tract: Secondary | ICD-10-CM | POA: Insufficient documentation

## 2014-01-25 DIAGNOSIS — G500 Trigeminal neuralgia: Secondary | ICD-10-CM | POA: Insufficient documentation

## 2014-01-25 DIAGNOSIS — R109 Unspecified abdominal pain: Secondary | ICD-10-CM | POA: Diagnosis not present

## 2014-01-25 DIAGNOSIS — G8918 Other acute postprocedural pain: Secondary | ICD-10-CM | POA: Diagnosis not present

## 2014-01-25 DIAGNOSIS — G43909 Migraine, unspecified, not intractable, without status migrainosus: Secondary | ICD-10-CM | POA: Insufficient documentation

## 2014-01-25 DIAGNOSIS — T798XXA Other early complications of trauma, initial encounter: Secondary | ICD-10-CM

## 2014-01-25 NOTE — ED Notes (Signed)
States she had abdominal surgery to replace the mesenteric lining in her stomach on November 19th. Tonight she noticed an odor and drainage from the site.

## 2014-01-26 MED ORDER — CEPHALEXIN 500 MG PO CAPS: 500.0000 mg | ORAL_CAPSULE | Freq: Four times a day (QID) | ORAL | Status: DC

## 2014-01-26 NOTE — ED Provider Notes (Signed)
CSN: [id number]     Arrival date & time 01/25/14  2143 History   First MD Initiated Contact with Patient 01/26/14 0028     Chief Complaint  Patient presents with  . Wound Check     (Consider location/radiation/quality/duration/timing/severity/associated sxs/prior Treatment) HPI Comments: Patient is a 43 year old female with history of recent surgery to repair her mesenteric lining. This was done laparoscopically at [facility] last week. She presents today with concerns that one of her incisions may be infected. She reports drainage and foul smell coming from the area.  Patient is a 43 y.o. female presenting with wound check. The history is provided by the patient.  Wound Check This is a new problem. The current episode started yesterday. The problem occurs constantly. The problem has not changed since onset.Associated symptoms include abdominal pain. Pertinent negatives include no chest pain. Nothing aggravates the symptoms. Nothing relieves the symptoms. She has tried nothing for the symptoms. The treatment provided no relief.    Past Medical History  Diagnosis Date  . Migraine   . Trigeminal neuralgia   . Molar pregnancy   . Seizures   . PTSD (post-traumatic stress disorder)   . Pseudotumor cerebri   . MS (multiple sclerosis)   . Mitral valve prolapse    Past Surgical History  Procedure Laterality Date  . Appendectomy    . Breast surgery    . Cholecystectomy    . Gastric bypass    . Tonsillectomy    . Carpal tunnel release    . Vaginal hysterectomy    . Bilateral salpingoophorectomy     Family History  Problem Relation Age of Onset  . Cancer Maternal Aunt     Colon Cancer   . Alcohol abuse Maternal Uncle   . Cancer Paternal Grandfather     Colon Cancer   History  Substance Use Topics  . Smoking status: Never Smoker   . Smokeless tobacco: Never Used  . Alcohol Use: No   OB History    No data available     Review of Systems  Cardiovascular: Negative for chest pain.   Gastrointestinal: Positive for abdominal pain.  All other systems reviewed and are negative.     Allergies  Demerol and Morphine and related  Home Medications   Prior to Admission medications   Medication Sig Start Date End Date Taking? Authorizing Provider  acetaZOLAMIDE (DIAMOX) 250 MG tablet Take 375 mg by mouth 2 (two) times daily.     Historical Provider, MD  acyclovir (ZOVIRAX) 800 MG tablet  01/29/13   Historical Provider, MD  carisoprodol (SOMA) 350 MG tablet  12/28/12   Historical Provider, MD  cetirizine (ZYRTEC) 10 MG tablet Take 10 mg by mouth daily.      Historical Provider, MD  clonazePAM (KLONOPIN) 1 MG tablet Take 1 mg by mouth daily.    Historical Provider, MD  Dimethyl Fumarate (TECFIDERA) 240 MG CPDR Take by mouth.    Historical Provider, MD  fluticasone Aleda Grana) 50 MCG/ACT nasal spray  12/21/12   Historical Provider, MD  gabapentin (NEURONTIN) 600 MG tablet Take 600 mg by mouth daily.      Historical Provider, MD  HYDROmorphone (DILAUDID) 4 MG tablet Take 0.5-1 tablets (2-4 mg total) by mouth every 4 (four) hours as needed for severe pain (for pain). 12/20/13   Carlisle Beers Molpus, MD  LamoTRIgine 300 MG TB24 200 mg 2 (two) times daily.  02/01/13   Historical Provider, MD  meclizine (ANTIVERT) 25 MG tablet  01/25/13  Historical Provider, MD  Multiple Vitamin (MULTIVITAMIN) tablet Take 1 tablet by mouth daily.    Historical Provider, MD  NON FORMULARY Estradiol cream as directed (daily)    Historical Provider, MD  ondansetron (ZOFRAN) 8 MG tablet Take 1 tablet (8 mg total) by mouth every 8 (eight) hours as needed for nausea. 12/20/13   Carlisle BeersJohn L Molpus, MD  oxyCODONE-acetaminophen (PERCOCET) 5-325 MG per tablet Take 2 tablets by mouth every 4 (four) hours as needed. 06/22/13   Geoffery Lyonsouglas Rondell Frick, MD  promethazine (PHENERGAN) 25 MG tablet Take 25 mg by mouth every 6 (six) hours as needed.      Historical Provider, MD  SUMAtriptan (IMITREX) 100 MG tablet Take 100 mg by mouth every 2  (two) hours as needed.      Historical Provider, MD   BP 111/72 mmHg  Pulse 86  Temp(Src) 98.4 F (36.9 C) (Oral)  Resp 20  Ht 5\' 6"  (1.676 m)  Wt 170 lb (77.111 kg)  BMI 27.45 kg/m2  SpO2 98% Physical Exam  Constitutional: She is oriented to person, place, and time. She appears well-developed and well-nourished. No distress.  HENT:  Head: Normocephalic and atraumatic.  Neck: Normal range of motion. Neck supple.  Abdominal:  There are multiple small incisions present on the upper abdominal wall. The left upper quadrant incision that she is concerned about appears to be healing well. There is no significant drainage or surrounding erythema. I am unable to palpate any abscess or fluctuance in the subcutaneous space.  Neurological: She is alert and oriented to person, place, and time.  Skin: Skin is warm and dry. She is not diaphoretic.  Nursing note and vitals reviewed.   ED Course  Procedures (including critical care time) Labs Review Labs Reviewed - No data to display  Imaging Review No results found.   EKG Interpretation None      MDM   Final diagnoses:  None    We'll treat with Keflex for possible developing wound infection. To follow-up with her surgeon if not improving in the next few days.    Geoffery Lyonsouglas Tyneka Scafidi, MD 01/26/14 [phone]

## 2014-01-26 NOTE — Discharge Instructions (Signed)
Keflex as prescribed.  Follow up with your surgeon if you worsen or do not improve in the next several days.   Wound Infection A wound infection happens when a type of germ (bacteria) starts growing in the wound. In some cases, this can cause the wound to break open. If cared for properly, the infected wound will heal from the inside to the outside. Wound infections need treatment. CAUSES An infection is caused by bacteria growing in the wound.  SYMPTOMS   Increase in redness, swelling, or pain at the wound site.  Increase in drainage at the wound site.  Wound or bandage (dressing) starts to smell bad.  Fever.  Feeling tired or fatigued.  Pus draining from the wound. TREATMENT  Your health care provider will prescribe antibiotic medicine. The wound infection should improve within 24 to 48 hours. Any redness around the wound should stop spreading and the wound should be less painful.  HOME CARE INSTRUCTIONS   Only take over-the-counter or prescription medicines for pain, discomfort, or fever as directed by your health care provider.  Take your antibiotics as directed. Finish them even if you start to feel better.  Gently wash the area with mild soap and water 2 times a day, or as directed. Rinse off the soap. Pat the area dry with a clean towel. Do not rub the wound. This may cause bleeding.  Follow your health care provider's instructions for how often you need to change the dressing.  Apply ointment and a dressing to the wound as directed.  If the dressing sticks, moisten it with soapy water and gently remove it.  Change the bandage right away if it becomes wet, dirty, or develops a bad smell.  Take showers. Do not take tub baths, swim, or do anything that may soak the wound until it is healed.  Avoid exercises that make you sweat heavily.  Use anti-itch medicine as directed by your health care provider. The wound may itch when it is healing. Do not pick or scratch at the  wound.  Follow up with your health care provider to get your wound rechecked as directed. SEEK MEDICAL CARE IF:  You have an increase in swelling, pain, or redness around the wound.  You have an increase in the amount of pus coming from the wound.  There is a bad smell coming from the wound.  More of the wound breaks open.  You have a fever. MAKE SURE YOU:   Understand these instructions.  Will watch your condition.  Will get help right away if you are not doing well or get worse. Document Released: 11/17/2002 Document Revised: 02/23/2013 Document Reviewed: 06/24/2010 [facility] Patient Information 2015 [place], [place]. This information is not intended to replace advice given to you by your health care provider. Make sure you discuss any questions you have with your health care provider.

## 2014-04-27 ENCOUNTER — Inpatient Hospital Stay (HOSPITAL_BASED_OUTPATIENT_CLINIC_OR_DEPARTMENT_OTHER)
Admission: EM | Admit: 2014-04-27 | Discharge: 2014-04-30 | DRG: 390 | Disposition: A | Discharge status: Home / Self Care | Payer: Federal, State, Local not specified - PPO | Attending: Internal Medicine | Admitting: Internal Medicine

## 2014-04-27 ENCOUNTER — Encounter (HOSPITAL_BASED_OUTPATIENT_CLINIC_OR_DEPARTMENT_OTHER): Payer: Self-pay

## 2014-04-27 ENCOUNTER — Emergency Department (HOSPITAL_BASED_OUTPATIENT_CLINIC_OR_DEPARTMENT_OTHER): Payer: Federal, State, Local not specified - PPO

## 2014-04-27 DIAGNOSIS — G35 Multiple sclerosis: Secondary | ICD-10-CM | POA: Diagnosis present

## 2014-04-27 DIAGNOSIS — R109 Unspecified abdominal pain: Secondary | ICD-10-CM | POA: Diagnosis not present

## 2014-04-27 DIAGNOSIS — Z9049 Acquired absence of other specified parts of digestive tract: Secondary | ICD-10-CM | POA: Diagnosis present

## 2014-04-27 DIAGNOSIS — R569 Unspecified convulsions: Secondary | ICD-10-CM | POA: Diagnosis present

## 2014-04-27 DIAGNOSIS — Z888 Allergy status to other drugs, medicaments and biological substances status: Secondary | ICD-10-CM

## 2014-04-27 DIAGNOSIS — Z7951 Long term (current) use of inhaled steroids: Secondary | ICD-10-CM | POA: Diagnosis not present

## 2014-04-27 DIAGNOSIS — G932 Benign intracranial hypertension: Secondary | ICD-10-CM | POA: Diagnosis present

## 2014-04-27 DIAGNOSIS — K567 Ileus, unspecified: Secondary | ICD-10-CM | POA: Diagnosis present

## 2014-04-27 DIAGNOSIS — F411 Generalized anxiety disorder: Secondary | ICD-10-CM | POA: Diagnosis present

## 2014-04-27 DIAGNOSIS — R197 Diarrhea, unspecified: Secondary | ICD-10-CM

## 2014-04-27 DIAGNOSIS — F4310 Post-traumatic stress disorder, unspecified: Secondary | ICD-10-CM | POA: Diagnosis present

## 2014-04-27 DIAGNOSIS — Z885 Allergy status to narcotic agent status: Secondary | ICD-10-CM

## 2014-04-27 DIAGNOSIS — Z90710 Acquired absence of both cervix and uterus: Secondary | ICD-10-CM

## 2014-04-27 DIAGNOSIS — Z9884 Bariatric surgery status: Secondary | ICD-10-CM

## 2014-04-27 DIAGNOSIS — G43909 Migraine, unspecified, not intractable, without status migrainosus: Secondary | ICD-10-CM | POA: Diagnosis present

## 2014-04-27 DIAGNOSIS — G500 Trigeminal neuralgia: Secondary | ICD-10-CM | POA: Diagnosis present

## 2014-04-27 DIAGNOSIS — R42 Dizziness and giddiness: Secondary | ICD-10-CM

## 2014-04-27 DIAGNOSIS — G35D Multiple sclerosis, unspecified: Secondary | ICD-10-CM | POA: Diagnosis present

## 2014-04-27 HISTORY — DX: Iron deficiency anemia, unspecified: D50.9

## 2014-04-27 LAB — BASIC METABOLIC PANEL
Anion gap: 4 — ABNORMAL LOW (ref 5–15)
BUN: 13 mg/dL (ref 6–23)
CO2: 22 mmol/L (ref 19–32)
Calcium: 8.8 mg/dL (ref 8.4–10.5)
Chloride: 113 mmol/L — ABNORMAL HIGH (ref 96–112)
Creatinine, Ser: 0.91 mg/dL (ref 0.50–1.10)
GFR calc Af Amer: 88 mL/min — ABNORMAL LOW (ref >90)
GFR, EST NON AFRICAN AMERICAN: 76 mL/min — AB (ref >90)
Glucose, Bld: 95 mg/dL (ref 70–99)
POTASSIUM: 3.4 mmol/L — AB (ref 3.5–5.1)
Sodium: 139 mmol/L (ref 135–145)

## 2014-04-27 LAB — CBC WITH DIFFERENTIAL/PLATELET
Basophils Absolute: 0 10*3/uL (ref 0.0–0.1)
Basophils Relative: 0 % (ref 0–1)
EOS PCT: 1 % (ref 0–5)
Eosinophils Absolute: 0.1 10*3/uL (ref 0.0–0.7)
HCT: 37.1 % (ref 36.0–46.0)
Hemoglobin: 11.5 g/dL — ABNORMAL LOW (ref 12.0–15.0)
Lymphocytes Relative: 24 % (ref 12–46)
Lymphs Abs: 1.9 10*3/uL (ref 0.7–4.0)
MCH: 28.5 pg (ref 26.0–34.0)
MCHC: 31 g/dL (ref 30.0–36.0)
MCV: 91.8 fL (ref 78.0–100.0)
Monocytes Absolute: 0.6 10*3/uL (ref 0.1–1.0)
Monocytes Relative: 8 % (ref 3–12)
NEUTROS PCT: 67 % (ref 43–77)
Neutro Abs: 5.2 10*3/uL (ref 1.7–7.7)
Platelets: 317 10*3/uL (ref 150–400)
RBC: 4.04 MIL/uL (ref 3.87–5.11)
RDW: 16.9 % — AB (ref 11.5–15.5)
WBC: 7.8 10*3/uL (ref 4.0–10.5)

## 2014-04-27 LAB — URINE MICROSCOPIC-ADD ON

## 2014-04-27 LAB — COMPREHENSIVE METABOLIC PANEL
ALK PHOS: 83 U/L (ref 39–117)
ALT: 30 U/L (ref 0–35)
AST: 20 U/L (ref 0–37)
Albumin: 3.9 g/dL (ref 3.5–5.2)
Anion gap: 1 — ABNORMAL LOW (ref 5–15)
BILIRUBIN TOTAL: 0.1 mg/dL — AB (ref 0.3–1.2)
BUN: 16 mg/dL (ref 6–23)
CHLORIDE: 116 mmol/L — AB (ref 96–112)
CO2: 19 mmol/L (ref 19–32)
Calcium: 7.9 mg/dL — ABNORMAL LOW (ref 8.4–10.5)
Creatinine, Ser: 0.93 mg/dL (ref 0.50–1.10)
GFR calc Af Amer: 86 mL/min — ABNORMAL LOW (ref >90)
GFR calc non Af Amer: 74 mL/min — ABNORMAL LOW (ref >90)
Glucose, Bld: 100 mg/dL — ABNORMAL HIGH (ref 70–99)
POTASSIUM: 4 mmol/L (ref 3.5–5.1)
Sodium: 136 mmol/L (ref 135–145)
Total Protein: 6.9 g/dL (ref 6.0–8.3)

## 2014-04-27 LAB — URINALYSIS, ROUTINE W REFLEX MICROSCOPIC
Bilirubin Urine: NEGATIVE
Glucose, UA: NEGATIVE mg/dL
Hgb urine dipstick: NEGATIVE
Ketones, ur: NEGATIVE mg/dL
LEUKOCYTES UA: NEGATIVE
Nitrite: POSITIVE — AB
PH: 7.5 (ref 5.0–8.0)
Protein, ur: NEGATIVE mg/dL
SPECIFIC GRAVITY, URINE: 1.02 (ref 1.005–1.030)
Urobilinogen, UA: 1 mg/dL (ref 0.0–1.0)

## 2014-04-27 LAB — SEDIMENTATION RATE: Sed Rate: 0 mm/hr (ref 0–22)

## 2014-04-27 LAB — LIPASE, BLOOD: LIPASE: 34 U/L (ref 11–59)

## 2014-04-27 LAB — I-STAT CG4 LACTIC ACID, ED: Lactic Acid, Venous: 0.35 mmol/L — ABNORMAL LOW (ref 0.5–2.0)

## 2014-04-27 LAB — MAGNESIUM: MAGNESIUM: 2.1 mg/dL (ref 1.5–2.5)

## 2014-04-27 LAB — PHOSPHORUS: Phosphorus: 3.1 mg/dL (ref 2.3–4.6)

## 2014-04-27 MED ORDER — ONDANSETRON HCL 4 MG/2ML IJ SOLN
4.0000 mg | Freq: Four times a day (QID) | INTRAMUSCULAR | Status: DC | PRN
  Administered 2014-04-28: 4 mg via INTRAVENOUS
  Filled 2014-04-27: qty 2

## 2014-04-27 MED ORDER — ACYCLOVIR 800 MG PO TABS
800.0000 mg | ORAL_TABLET | Freq: Two times a day (BID) | ORAL | Status: DC
  Administered 2014-04-27 – 2014-04-30 (×6): 800 mg via ORAL
  Filled 2014-04-27 (×7): qty 1

## 2014-04-27 MED ORDER — FLUTICASONE PROPIONATE 50 MCG/ACT NA SUSP
2.0000 | Freq: Every day | NASAL | Status: DC
  Administered 2014-04-30: 2 via NASAL
  Filled 2014-04-27: qty 16

## 2014-04-27 MED ORDER — SODIUM CHLORIDE 0.9 % IV SOLN
INTRAVENOUS | Status: DC
  Administered 2014-04-27: 19:00:00 via INTRAVENOUS
  Filled 2014-04-27 (×2): qty 1000

## 2014-04-27 MED ORDER — HYDROMORPHONE HCL 1 MG/ML IJ SOLN
1.0000 mg | INTRAMUSCULAR | Status: DC | PRN
  Administered 2014-04-27: 1 mg via INTRAVENOUS
  Filled 2014-04-27: qty 1

## 2014-04-27 MED ORDER — HYDROXYZINE HCL 50 MG/ML IM SOLN: 50.0000 mg | Freq: Four times a day (QID) | INTRAMUSCULAR | Status: DC | PRN

## 2014-04-27 MED ORDER — THIAMINE HCL 100 MG/ML IJ SOLN
Freq: Once | INTRAVENOUS | Status: AC
  Administered 2014-04-27: 23:00:00 via INTRAVENOUS
  Filled 2014-04-27: qty 1000

## 2014-04-27 MED ORDER — LEVETIRACETAM 500 MG PO TABS
500.0000 mg | ORAL_TABLET | Freq: Two times a day (BID) | ORAL | Status: DC
  Administered 2014-04-27 – 2014-04-30 (×6): 500 mg via ORAL
  Filled 2014-04-27 (×8): qty 1

## 2014-04-27 MED ORDER — HYDROMORPHONE HCL 1 MG/ML IJ SOLN
0.5000 mg | Freq: Once | INTRAMUSCULAR | Status: AC
  Administered 2014-04-27: 0.5 mg via INTRAVENOUS
  Filled 2014-04-27: qty 1

## 2014-04-27 MED ORDER — ONDANSETRON HCL 4 MG PO TABS: 4.0000 mg | ORAL_TABLET | Freq: Four times a day (QID) | ORAL | Status: DC | PRN

## 2014-04-27 MED ORDER — GABAPENTIN 600 MG PO TABS
1200.0000 mg | ORAL_TABLET | Freq: Every day | ORAL | Status: DC
  Filled 2014-04-27: qty 2

## 2014-04-27 MED ORDER — NORTRIPTYLINE HCL 25 MG PO CAPS
75.0000 mg | ORAL_CAPSULE | Freq: Every day | ORAL | Status: DC
  Administered 2014-04-27 – 2014-04-28 (×2): 75 mg via ORAL
  Filled 2014-04-27 (×4): qty 3

## 2014-04-27 MED ORDER — DOCUSATE SODIUM 100 MG PO CAPS: 100.0000 mg | ORAL_CAPSULE | Freq: Two times a day (BID) | ORAL | Status: DC

## 2014-04-27 MED ORDER — HYDROMORPHONE HCL 1 MG/ML IJ SOLN
1.0000 mg | Freq: Once | INTRAMUSCULAR | Status: AC
  Administered 2014-04-27: 1 mg via INTRAVENOUS
  Filled 2014-04-27: qty 1

## 2014-04-27 MED ORDER — OXYCODONE-ACETAMINOPHEN 5-325 MG PO TABS
2.0000 | ORAL_TABLET | ORAL | Status: DC | PRN
  Administered 2014-04-28 – 2014-04-30 (×8): 2 via ORAL
  Filled 2014-04-27 (×8): qty 2

## 2014-04-27 MED ORDER — MORPHINE SULFATE 2 MG/ML IJ SOLN: 2.0000 mg | INTRAMUSCULAR | Status: DC | PRN

## 2014-04-27 MED ORDER — HEPARIN SODIUM (PORCINE) 5000 UNIT/ML IJ SOLN
5000.0000 [IU] | Freq: Three times a day (TID) | INTRAMUSCULAR | Status: DC
  Administered 2014-04-27 – 2014-04-30 (×8): 5000 [IU] via SUBCUTANEOUS
  Filled 2014-04-27 (×11): qty 1

## 2014-04-27 MED ORDER — MECLIZINE HCL 25 MG PO TABS
25.0000 mg | ORAL_TABLET | Freq: Three times a day (TID) | ORAL | Status: DC | PRN
  Filled 2014-04-27: qty 1

## 2014-04-27 MED ORDER — HYDROXYZINE HCL 50 MG/ML IM SOLN
50.0000 mg | Freq: Four times a day (QID) | INTRAMUSCULAR | Status: DC | PRN
  Filled 2014-04-27: qty 1

## 2014-04-27 MED ORDER — DIPHENHYDRAMINE HCL 50 MG/ML IJ SOLN: 12.5000 mg | Freq: Four times a day (QID) | INTRAMUSCULAR | Status: DC | PRN

## 2014-04-27 MED ORDER — SODIUM CHLORIDE 0.9 % IV BOLUS (SEPSIS)
1000.0000 mL | Freq: Once | INTRAVENOUS | Status: DC
Start: 2014-04-27 — End: 2014-04-27

## 2014-04-27 MED ORDER — ACETAMINOPHEN 650 MG RE SUPP: 650.0000 mg | Freq: Four times a day (QID) | RECTAL | Status: DC | PRN

## 2014-04-27 MED ORDER — ACETAMINOPHEN 325 MG PO TABS: 650.0000 mg | ORAL_TABLET | Freq: Four times a day (QID) | ORAL | Status: DC | PRN

## 2014-04-27 MED ORDER — PROMETHAZINE HCL 25 MG PO TABS: 25.0000 mg | ORAL_TABLET | Freq: Four times a day (QID) | ORAL | Status: DC | PRN

## 2014-04-27 MED ORDER — ONDANSETRON HCL 4 MG/2ML IJ SOLN: 4.0000 mg | Freq: Four times a day (QID) | INTRAMUSCULAR | Status: DC | PRN

## 2014-04-27 MED ORDER — ACETAMINOPHEN 325 MG PO TABS: 650.0000 mg | ORAL_TABLET | ORAL | Status: DC | PRN

## 2014-04-27 MED ORDER — ACETAZOLAMIDE 250 MG PO TABS
500.0000 mg | ORAL_TABLET | Freq: Two times a day (BID) | ORAL | Status: DC
  Administered 2014-04-27 – 2014-04-30 (×6): 500 mg via ORAL
  Filled 2014-04-27 (×7): qty 2

## 2014-04-27 MED ORDER — IOHEXOL 300 MG/ML  SOLN
100.0000 mL | Freq: Once | INTRAMUSCULAR | Status: AC | PRN
  Administered 2014-04-27: 100 mL via INTRAVENOUS

## 2014-04-27 MED ORDER — CLONAZEPAM 0.5 MG PO TABS: 0.5000 mg | ORAL_TABLET | Freq: Three times a day (TID) | ORAL | Status: DC | PRN

## 2014-04-27 MED ORDER — HYDROMORPHONE HCL 1 MG/ML IJ SOLN
0.5000 mg | INTRAMUSCULAR | Status: DC | PRN
  Administered 2014-04-28 (×2): 0.5 mg via INTRAVENOUS
  Filled 2014-04-27 (×2): qty 1

## 2014-04-27 MED ORDER — CARISOPRODOL 350 MG PO TABS
350.0000 mg | ORAL_TABLET | Freq: Every day | ORAL | Status: DC
  Administered 2014-04-27 – 2014-04-29 (×3): 350 mg via ORAL
  Filled 2014-04-27 (×3): qty 1

## 2014-04-27 MED ORDER — GABAPENTIN 400 MG PO CAPS
1200.0000 mg | ORAL_CAPSULE | Freq: Every day | ORAL | Status: DC
  Administered 2014-04-27 – 2014-04-29 (×3): 1200 mg via ORAL
  Filled 2014-04-27 (×4): qty 3

## 2014-04-27 MED ORDER — LAMOTRIGINE ER 200 MG PO TB24: 200.0000 mg | ORAL_TABLET | Freq: Two times a day (BID) | ORAL | Status: DC

## 2014-04-27 MED ORDER — LAMOTRIGINE 200 MG PO TABS
200.0000 mg | ORAL_TABLET | Freq: Two times a day (BID) | ORAL | Status: DC
  Administered 2014-04-27 – 2014-04-30 (×6): 200 mg via ORAL
  Filled 2014-04-27 (×7): qty 1

## 2014-04-27 MED ORDER — ONDANSETRON HCL 4 MG/2ML IJ SOLN
4.0000 mg | Freq: Three times a day (TID) | INTRAMUSCULAR | Status: DC | PRN
  Administered 2014-04-27: 4 mg via INTRAVENOUS
  Filled 2014-04-27: qty 2

## 2014-04-27 MED ORDER — ONDANSETRON HCL 4 MG/2ML IJ SOLN
4.0000 mg | Freq: Once | INTRAMUSCULAR | Status: AC
  Administered 2014-04-27: 4 mg via INTRAVENOUS
  Filled 2014-04-27: qty 2

## 2014-04-27 MED ORDER — DIMETHYL FUMARATE 240 MG PO CPDR
240.0000 mg | DELAYED_RELEASE_CAPSULE | Freq: Two times a day (BID) | ORAL | Status: DC
  Administered 2014-04-27 – 2014-04-30 (×6): 240 mg via ORAL

## 2014-04-27 NOTE — ED Notes (Signed)
[facility] at beside for transport. 

## 2014-04-27 NOTE — ED Provider Notes (Signed)
CSN: [id number]     Arrival date & time 04/27/14  1106 History   First MD Initiated Contact with Patient 04/27/14 1202     Chief Complaint  Patient presents with  . Abdominal Pain     (Consider location/radiation/quality/duration/timing/severity/associated sxs/prior Treatment) Patient is a 44 y.o. female presenting with abdominal pain. The history is provided by the patient and medical records. No language interpreter was used.  Abdominal Pain Associated symptoms: diarrhea, nausea and vomiting   Associated symptoms: no chest pain, no constipation, no cough, no dysuria, no fatigue, no fever, no hematuria and no shortness of breath       Joyce Lang is a 44 y.o. female  with a hx of gastric bypass, seizures, PTSD, pseudotumor cerebra I, multiple sclerosis, mitral valve prolapse with surgical history of appendectomy, cholecystectomy and hysterectomy with bilateral salpingo-oophorectomy presents to the Emergency Department complaining of gradual, persistent, progressively worsening abdominal pain with associated vomiting and diarrhea onset 1 week ago.  She reports her last bout of emesis was 3 days ago however she's had persistent diarrhea. She denies hematemesis, melena or hematochezia. Patient reports her pain is a 10/10 and she is unable to characterize it just simply stating that it hurts. Patient reports taking no medications prior to arrival. She denies alcohol or NSAID usage.  Patient denies fever, chills, headache, neck pain, chest pain, shortness of breath melena skin dizziness, syncope, dysuria, hematuria.   Past Medical History  Diagnosis Date  . Migraine   . Trigeminal neuralgia   . Molar pregnancy   . Seizures   . PTSD (post-traumatic stress disorder)   . Pseudotumor cerebri   . MS (multiple sclerosis)   . Mitral valve prolapse    Past Surgical History  Procedure Laterality Date  . Appendectomy    . Breast surgery    . Cholecystectomy    . Gastric bypass    .  Tonsillectomy    . Carpal tunnel release    . Vaginal hysterectomy    . Bilateral salpingoophorectomy     Family History  Problem Relation Age of Onset  . Cancer Maternal Aunt     Colon Cancer   . Alcohol abuse Maternal Uncle   . Cancer Paternal Grandfather     Colon Cancer   History  Substance Use Topics  . Smoking status: Never Smoker   . Smokeless tobacco: Never Used  . Alcohol Use: No   OB History    No data available     Review of Systems  Constitutional: Negative for fever, diaphoresis, appetite change, fatigue and unexpected weight change.  HENT: Negative for mouth sores and trouble swallowing.   Respiratory: Negative for cough, chest tightness, shortness of breath, wheezing and stridor.   Cardiovascular: Negative for chest pain and palpitations.  Gastrointestinal: Positive for nausea, vomiting, abdominal pain and diarrhea. Negative for constipation, blood in stool, abdominal distention and rectal pain.  Genitourinary: Negative for dysuria, urgency, frequency, hematuria, flank pain and difficulty urinating.  Musculoskeletal: Negative for back pain, neck pain and neck stiffness.  Skin: Negative for rash.  Neurological: Negative for weakness.  Hematological: Negative for adenopathy.  Psychiatric/Behavioral: Negative for confusion.  All other systems reviewed and are negative.     Allergies  Demerol and Morphine and related  Home Medications   Prior to Admission medications   Medication Sig Start Date End Date Taking? Authorizing Provider  LevETIRAcetam (KEPPRA PO) Take by mouth.   Yes Historical Provider, MD  acetaZOLAMIDE (DIAMOX) 250 MG tablet  Take 375 mg by mouth 2 (two) times daily.     Historical Provider, MD  acyclovir (ZOVIRAX) 800 MG tablet  01/29/13   Historical Provider, MD  carisoprodol (SOMA) 350 MG tablet  12/28/12   Historical Provider, MD  cetirizine (ZYRTEC) 10 MG tablet Take 10 mg by mouth daily.      Historical Provider, MD  clonazePAM  (KLONOPIN) 1 MG tablet Take 1 mg by mouth daily.    Historical Provider, MD  Dimethyl Fumarate (TECFIDERA) 240 MG CPDR Take by mouth.    Historical Provider, MD  fluticasone Aleda Grana) 50 MCG/ACT nasal spray  12/21/12   Historical Provider, MD  gabapentin (NEURONTIN) 600 MG tablet Take 600 mg by mouth daily.      Historical Provider, MD  LamoTRIgine 300 MG TB24 200 mg 2 (two) times daily.  02/01/13   Historical Provider, MD  meclizine (ANTIVERT) 25 MG tablet  01/25/13   Historical Provider, MD  Multiple Vitamin (MULTIVITAMIN) tablet Take 1 tablet by mouth daily.    Historical Provider, MD  NON FORMULARY Estradiol cream as directed (daily)    Historical Provider, MD  oxyCODONE-acetaminophen (PERCOCET) 5-325 MG per tablet Take 2 tablets by mouth every 4 (four) hours as needed. 06/22/13   [name], MD  promethazine (PHENERGAN) 25 MG tablet Take 25 mg by mouth every 6 (six) hours as needed.      Historical Provider, MD  SUMAtriptan (IMITREX) 100 MG tablet Take 100 mg by mouth every 2 (two) hours as needed.      Historical Provider, MD   BP 109/75 mmHg  Pulse 77  Temp(Src) 98.2 F (36.8 C) (Oral)  Resp 16  Ht  (1.676 m)  Wt 178 lb 8 oz (80.967 kg)  BMI 28.82 kg/m2  SpO2 100% Physical Exam  Constitutional: She appears well-developed and well-nourished.  HENT:  Head: Normocephalic and atraumatic.  Mouth/Throat: Oropharynx is clear and moist.  Eyes: Conjunctivae are normal. No scleral icterus.  Cardiovascular: Normal rate, regular rhythm and intact distal pulses.   Pulmonary/Chest: Effort normal and breath sounds normal.  Abdominal: Soft. Bowel sounds are normal. She exhibits no distension and no mass. There is tenderness in the right upper quadrant, epigastric area and left upper quadrant. There is guarding. There is no rebound and no CVA tenderness.  Patient exquisitely tender in the epigastrium, left upper quadrant and right upper quadrant, she is less tender and the rest of the  abdomen but is tender throughout Patient with guarding upon palpation to the epigastrium and bilateral upper quadrants without rebound or peritoneal signs No CVA tenderness  Neurological: She is alert.  Skin: Skin is warm and dry.  Psychiatric: She has a normal mood and affect.  Nursing note and vitals reviewed.   ED Course  Procedures (including critical care time) Labs Review Labs Reviewed  URINALYSIS, ROUTINE W REFLEX MICROSCOPIC - Abnormal; Notable for the following:    Color, Urine AMBER (*)    Nitrite POSITIVE (*)    All other components within normal limits  CBC WITH DIFFERENTIAL/PLATELET - Abnormal; Notable for the following:    Hemoglobin 11.5 (*)    RDW 16.9 (*)    All other components within normal limits  COMPREHENSIVE METABOLIC PANEL - Abnormal; Notable for the following:    Chloride 116 (*)    Glucose, Bld 100 (*)    Calcium 7.9 (*)    Total Bilirubin 0.1 (*)    GFR calc non Af Amer 74 (*)    GFR  calc Af Amer 86 (*)    Anion gap 1 (*)    All other components within normal limits  URINE MICROSCOPIC-ADD ON - Abnormal; Notable for the following:    Bacteria, UA MANY (*)    All other components within normal limits  I-STAT CG4 LACTIC ACID, ED - Abnormal; Notable for the following:    Lactic Acid, Venous 0.35 (*)    All other components within normal limits  LIPASE, BLOOD    Imaging Review Ct Abdomen Pelvis W Contrast  04/27/2014   CLINICAL DATA:  44 year old female with abdomen and right flank pain for 1 week. Vomiting and diarrhea. Previous abdominal surgery including gastric bypass. Initial encounter.  EXAM: CT ABDOMEN AND PELVIS WITH CONTRAST  TECHNIQUE: Multidetector CT imaging of the abdomen and pelvis was performed using the standard protocol following bolus administration of intravenous contrast.  CONTRAST:  OMNIPAQUE IOHEXOL 300 MG/ML  SOLN  COMPARISON:  12/20/2013 CT Abdomen and Pelvis and earlier  FINDINGS: Mild cardiomegaly is stable. Negative lung  bases. No pericardial or pleural effusion.  No acute osseous abnormality identified.  Increased stool in the distal colon. No abdominal free fluid. Uterus surgically absent. Adnexal may also be surgically absent. Unremarkable bladder.  Mildly redundant sigmoid colon with retained stool. Negative left colon, splenic flexure, transverse colon and right colon. Sequelae of appendectomy. Most distal small bowel loops are fluid-filled but not abnormally dilated. There is some loculated material in some distal small bowel loops. Oral contrast was administered. Patent gastrojejunostomy. The proximal small bowel is patulous in appearance, including at the small bowel anastomosis in the left upper quadrant, but oral contrast has reached more distal nondilated loops. The bypassed stomach is decompressed. There is a trace amount of contrast in the duodenum which appears normal.  Gallbladder surgically absent. Stable extrahepatic biliary ductal enlargement. Stable and negative liver parenchyma. Spleen, pancreas, main pancreatic duct, adrenal glands, and kidneys are stable. Renal contrast excretion is within normal limits. No abdominal free fluid or free air. No bowel wall thickening or mesenteric inflammation. Portal venous system is patent. Major arterial structures are within normal limits.  IMPRESSION: 1. Upper limits of normal to mildly dilated small bowel loops but with no transition point or strong evidence of bowel obstruction. Perhaps the appearance reflects a small bowel ileus. 2. Otherwise no acute or inflammatory process identified in the abdomen or pelvis, with extensive postoperative changes re- identified.   Electronically Signed   By: Odessa Fleming M.D.   On: 04/27/2014 13:56     EKG Interpretation None      MDM   Final diagnoses:  Abdominal pain, acute  H/O gastric bypass  Diarrhea   Lita Mains presents with extensive surgical history, reports of nausea and vomiting and significant abdominal pain on  exam. Will give pain medicines, check labs and a CT scan.  2:47 PM Patient was somewhat improved pain. Labs reassuring and patient without leukocytosis. Anemia at baseline. No evidence of urinary tract infection. Lipase and hepatic function within normal limits, no elevated lactic acid. CT scan with  concern for potential small bowel ileus. Will discuss with patient surgeon Dr. Jacqulyn Ducking at [facility].  3:35PM Pt discussed with Dr. Sanjuana Letters RN Misty Stanley who has consulted with Dr. Jacqulyn Ducking and has reviewed pt's CT scan with him.  They are recommending overnight observation locally with planned follow-up at [facility] in the surgical clinic at 10am on Friday.  If pt is to worsen or develop a bowel obstruction the [facility] surgical  clinic should be contacted at [phone] (ask for surgical resident on call for [facility]).  4:20 PM Pt discussed with Dr. Janee Morn who will admit to medsurg.    BP 109/75 mmHg  Pulse 77  Temp(Src) 98.2 F (36.8 C) (Oral)  Resp 16  Ht  (1.676 m)  Wt 178 lb 8 oz (80.967 kg)  BMI 28.82 kg/m2  SpO2 100%   Dierdre Forth, PA-C 04/27/14 1747  Tilden Fossa, MD 05/02/14 [phone]

## 2014-04-27 NOTE — Progress Notes (Signed)
Patient admitted from [facility] at 36. Paged for admitting Physician at 1820 to see patient and enter orders. Patient came with no orders. Flow Manager returned call at [phone] and stated she was not going to page admitting MD until after shift change because the admitting MD was admitting patients from the ED. Patient in pain and having nausea.

## 2014-04-27 NOTE — H&P (Signed)
[facility] History and Physical  Patient: Joyce Lang  MRN: [id number]  DOB: 10/02/1970  DOS: the patient was seen and examined on 04/27/2014 PCP: Birdena Jubilee, MD  Chief Complaint: Abdominal pain with nausea  HPI: Joyce Lang is a 44 y.o. female with Past medical history of bariatric surgery, irritable bowel syndrome, seizure, trigeminal neuralgia, pseudotumor cerebri, gestational trophoblastic neoplasm. The patient is presenting with complaints of nausea and abdominal pain. The symptoms have been ongoing for last 1 week. She also had diarrhea. She mentions diarrhea was loose watery without any blood. Diarrhea occurs anytime she eats. Denies any fever or chills denies any vomiting with complaints of nausea persistently. Denies any chest pain or shortness of breath. She mentions she is compliant with all her medication. Her symptoms primarily started after taking Keppra. She denies any other changes in her medication. She was recently admitted at [facility] in November for exploratory laparotomy. She was on antibiotics after that. She was also on antibiotics in January for sinusitis.  The patient is coming from ome. And at her baseline independent for most of her ADL.  Review of Systems: as mentioned in the history of present illness.  A Comprehensive review of the other systems is negative.  Past Medical History  Diagnosis Date  . Migraine   . Trigeminal neuralgia   . Molar pregnancy   . Seizures   . PTSD (post-traumatic stress disorder)   . Pseudotumor cerebri   . MS (multiple sclerosis)   . Mitral valve prolapse    Past Surgical History  Procedure Laterality Date  . Appendectomy    . Breast surgery    . Cholecystectomy    . Gastric bypass    . Tonsillectomy    . Carpal tunnel release    . Vaginal hysterectomy    . Bilateral salpingoophorectomy     Social History:  reports that she has never smoked. She has never used smokeless tobacco. She reports that she does  not drink alcohol or use illicit drugs.  Allergies  Allergen Reactions  . Demerol Hives  . Morphine And Related Hives    States she is not allergic to MS    Family History  Problem Relation Age of Onset  . Cancer Maternal Aunt     Colon Cancer   . Alcohol abuse Maternal Uncle   . Cancer Paternal Grandfather     Colon Cancer    Prior to Admission medications   Medication Sig Start Date End Date Taking? Authorizing Provider  acyclovir (ZOVIRAX) 800 MG tablet Take 800 mg by mouth 2 (two) times daily.  01/29/13  Yes Historical Provider, MD  carisoprodol (SOMA) 350 MG tablet Take 350 mg by mouth at bedtime.  12/28/12  Yes Historical Provider, MD  cetirizine (ZYRTEC) 10 MG tablet Take 10 mg by mouth daily.     Yes Historical Provider, MD  clonazePAM (KLONOPIN) 1 MG tablet Take 0.5 mg by mouth 3 (three) times daily as needed for anxiety.    Yes Historical Provider, MD  Dimethyl Fumarate (TECFIDERA) 240 MG CPDR Take 240 mg by mouth 2 (two) times daily.    Yes Historical Provider, MD  gabapentin (NEURONTIN) 600 MG tablet Take 1,200 mg by mouth at bedtime.    Yes Historical Provider, MD  LamoTRIgine XR 200 MG TB24 Take 200 mg by mouth 2 (two) times daily.   Yes Historical Provider, MD  LevETIRAcetam (KEPPRA PO) Take 500 mg by mouth 2 (two) times daily.    Yes  Historical Provider, MD  nortriptyline (PAMELOR) 75 MG capsule Take 75 mg by mouth at bedtime.   Yes Historical Provider, MD  acetaZOLAMIDE (DIAMOX) 250 MG tablet Take 375 mg by mouth 2 (two) times daily.     Historical Provider, MD  fluticasone Aleda Grana) 50 MCG/ACT nasal spray  12/21/12   Historical Provider, MD  LamoTRIgine 300 MG TB24 200 mg 2 (two) times daily.  02/01/13   Historical Provider, MD  meclizine (ANTIVERT) 25 MG tablet Take 25 mg by mouth 3 (three) times daily as needed for dizziness.  01/25/13   Historical Provider, MD  Multiple Vitamin (MULTIVITAMIN) tablet Take 1 tablet by mouth daily.    Historical Provider, MD  NON  FORMULARY Estradiol cream as directed (daily)    Historical Provider, MD  oxyCODONE-acetaminophen (PERCOCET) 5-325 MG per tablet Take 2 tablets by mouth every 4 (four) hours as needed. 06/22/13   [name], MD  promethazine (PHENERGAN) 25 MG tablet Take 25 mg by mouth every 6 (six) hours as needed.      Historical Provider, MD  SUMAtriptan (IMITREX) 100 MG tablet Take 100 mg by mouth every 2 (two) hours as needed.      Historical Provider, MD    Physical Exam: Filed Vitals:   04/27/14 1110 04/27/14 1448 04/27/14 1703 04/27/14 1812  BP: 106/60 115/78 109/75 119/73  Pulse: 98 78 77 75  Temp: 97.9 F (36.6 C) 98.2 F (36.8 C)  98.1 F (36.7 C)  TempSrc: Oral Oral    Resp: 16 16 16 20   Height: 5\' 6"  (1.676 m)     Weight: 80.967 kg (178 lb 8 oz)     SpO2: 100% 100% 100% 100%    General: Alert, Awake and Oriented to Time, Place and Person. Appear in mild distress Eyes: PERRL  ENT: Oral Mucosa clear moist. Neck: no JVD Cardiovascular: S1 and S2 Present, no Murmur, Peripheral Pulses Present Respiratory: Bilateral Air entry equal and Decreased, Clear to Auscultation, noCrackles, no wheezes Abdomen: Bowel Sound present, Soft and diffusely tender Skin: no Rash Extremities: no Pedal edema, no calf tenderness Neurologic: Grossly no focal neuro deficit.  Labs on Admission:  CBC:  Recent Labs Lab 04/27/14 1155  WBC 7.8  NEUTROABS 5.2  HGB 11.5*  HCT 37.1  MCV 91.8  PLT 317    CMP     Component Value Date/Time   NA 136 04/27/2014 1155   K 4.0 04/27/2014 1155   CL 116* 04/27/2014 1155   CO2 19 04/27/2014 1155   GLUCOSE 100* 04/27/2014 1155   BUN 16 04/27/2014 1155   CREATININE 0.93 04/27/2014 1155   CALCIUM 7.9* 04/27/2014 1155   PROT 6.9 04/27/2014 1155   ALBUMIN 3.9 04/27/2014 1155   AST 20 04/27/2014 1155   ALT 30 04/27/2014 1155   ALKPHOS 83 04/27/2014 1155   BILITOT 0.1* 04/27/2014 1155   GFRNONAA 74* 04/27/2014 1155   GFRAA 86* 04/27/2014 1155     Recent  Labs Lab 04/27/14 1155  LIPASE 34    No results for input(s): CKTOTAL, CKMB, CKMBINDEX, TROPONINI in the last 168 hours. BNP (last 3 results) No results for input(s): BNP in the last 8760 hours.  ProBNP (last 3 results) No results for input(s): PROBNP in the last 8760 hours.   Radiological Exams on Admission: Ct Abdomen Pelvis W Contrast  04/27/2014   CLINICAL DATA:  44 year old female with abdomen and right flank pain for 1 week. Vomiting and diarrhea. Previous abdominal surgery including gastric bypass. Initial encounter.  EXAM: CT ABDOMEN AND PELVIS WITH CONTRAST  TECHNIQUE: Multidetector CT imaging of the abdomen and pelvis was performed using the standard protocol following bolus administration of intravenous contrast.  CONTRAST:  OMNIPAQUE IOHEXOL 300 MG/ML  SOLN  COMPARISON:  12/20/2013 CT Abdomen and Pelvis and earlier  FINDINGS: Mild cardiomegaly is stable. Negative lung bases. No pericardial or pleural effusion.  No acute osseous abnormality identified.  Increased stool in the distal colon. No abdominal free fluid. Uterus surgically absent. Adnexal may also be surgically absent. Unremarkable bladder.  Mildly redundant sigmoid colon with retained stool. Negative left colon, splenic flexure, transverse colon and right colon. Sequelae of appendectomy. Most distal small bowel loops are fluid-filled but not abnormally dilated. There is some loculated material in some distal small bowel loops. Oral contrast was administered. Patent gastrojejunostomy. The proximal small bowel is patulous in appearance, including at the small bowel anastomosis in the left upper quadrant, but oral contrast has reached more distal nondilated loops. The bypassed stomach is decompressed. There is a trace amount of contrast in the duodenum which appears normal.  Gallbladder surgically absent. Stable extrahepatic biliary ductal enlargement. Stable and negative liver parenchyma. Spleen, pancreas, main pancreatic  duct, adrenal glands, and kidneys are stable. Renal contrast excretion is within normal limits. No abdominal free fluid or free air. No bowel wall thickening or mesenteric inflammation. Portal venous system is patent. Major arterial structures are within normal limits.  IMPRESSION: 1. Upper limits of normal to mildly dilated small bowel loops but with no transition point or strong evidence of bowel obstruction. Perhaps the appearance reflects a small bowel ileus. 2. Otherwise no acute or inflammatory process identified in the abdomen or pelvis, with extensive postoperative changes re- identified.   Electronically Signed   By: Odessa Fleming M.D.   On: 04/27/2014 13:56    Assessment/Plan Principal Problem:   Ileus Active Problems:   Trigeminal neuralgia   Seizures   Pseudotumor cerebri   MS (multiple sclerosis)   H/O gastric bypass   1. Ileus The patient is presenting with complaints of abdominal pain nausea. She has complaints of diarrhea. Symptoms have been ongoing for last 1 week. Her lab work does not show any significant abnormality. CT of the abdomen shows evidence of ileus versus possible obstruction. The patient does not have any vomiting at present. We'll continue to closely monitor her and treat her conservatively. The patient was initially discussed with [facility] was recommended the patient to be admitted to local hospital. The patient will remain on liquid diet. When necessary Zofran and when necessary pain medications ordered. Gentle IV hydration.  2. History of seizures. Continue home medications.  3. History of gastric bypass. Possibility of small bowel bacterial overgrowth, possibility of short bowel loops syndrome cannot be ruled out as a cause of patient's diarrhea with abdominal pain. Continue home medication and obtain further workup. Possibility of C. difficile cannot be ruled out due to her recent antibiotic use. Check C. difficile.  4. Anxiety. Continue home  medications.  Advance goals of care discussion:  Full code  Consults: [facility] surgery, phone consultation  DVT Prophylaxis: subcutaneous Heparin Nutrition: nothing by mouth except medications  Disposition: Admitted to inpatient in med-surge unit.  Author: Lynden Oxford, MD [facility] Pager: [phone] 04/27/2014, 8:02 PM    If 7PM-7AM, please contact night-coverage [email or web address] Password [email or web address]

## 2014-04-27 NOTE — ED Notes (Signed)
abd pain, right flank pain x 1 week-v/d

## 2014-04-27 NOTE — ED Notes (Signed)
Attempted to call report to floor but the nurse was not available.  She will call back.

## 2014-04-27 NOTE — Progress Notes (Signed)
Transfer from [employer]. Patient is a 44 year old female status post gastric bypass surgery with multiple revisions, PTSD, seizures, pseudo-tumor cerebri, multiple sclerosis status post appendectomy, cholecystectomy, hysterectomy with bilateral salpingo-oophorectomy who presented to the ED with nausea vomiting diarrhea 1 week.CT abdomen and pelvis which was done was concerning for ileus versus developing small bowel obstruction. ED physician stated that she spoke with patient's surgeon at [facility] who recommended patient be admitted overnight for observation and if patient improved could follow-up at [facility] clinic on Friday, 04/29/2014. Patient accepted to MedSurg floor.

## 2014-04-28 ENCOUNTER — Encounter (HOSPITAL_COMMUNITY): Payer: Self-pay | Admitting: Internal Medicine

## 2014-04-28 DIAGNOSIS — F411 Generalized anxiety disorder: Secondary | ICD-10-CM | POA: Diagnosis present

## 2014-04-28 DIAGNOSIS — R197 Diarrhea, unspecified: Secondary | ICD-10-CM

## 2014-04-28 DIAGNOSIS — R42 Dizziness and giddiness: Secondary | ICD-10-CM

## 2014-04-28 LAB — CBC WITH DIFFERENTIAL/PLATELET
BASOS PCT: 1 % (ref 0–1)
Basophils Absolute: 0 10*3/uL (ref 0.0–0.1)
EOS PCT: 2 % (ref 0–5)
Eosinophils Absolute: 0.1 10*3/uL (ref 0.0–0.7)
HCT: 33.2 % — ABNORMAL LOW (ref 36.0–46.0)
HEMOGLOBIN: 10.1 g/dL — AB (ref 12.0–15.0)
LYMPHS ABS: 2.6 10*3/uL (ref 0.7–4.0)
Lymphocytes Relative: 47 % — ABNORMAL HIGH (ref 12–46)
MCH: 28.3 pg (ref 26.0–34.0)
MCHC: 30.4 g/dL (ref 30.0–36.0)
MCV: 93 fL (ref 78.0–100.0)
Monocytes Absolute: 0.5 10*3/uL (ref 0.1–1.0)
Monocytes Relative: 9 % (ref 3–12)
Neutro Abs: 2.3 10*3/uL (ref 1.7–7.7)
Neutrophils Relative %: 41 % — ABNORMAL LOW (ref 43–77)
PLATELETS: 277 10*3/uL (ref 150–400)
RBC: 3.57 MIL/uL — AB (ref 3.87–5.11)
RDW: 17.1 % — ABNORMAL HIGH (ref 11.5–15.5)
WBC: 5.4 10*3/uL (ref 4.0–10.5)

## 2014-04-28 LAB — COMPREHENSIVE METABOLIC PANEL
ALK PHOS: 61 U/L (ref 39–117)
ALT: 24 U/L (ref 0–35)
AST: 17 U/L (ref 0–37)
Albumin: 3.4 g/dL — ABNORMAL LOW (ref 3.5–5.2)
Anion gap: 3 — ABNORMAL LOW (ref 5–15)
BILIRUBIN TOTAL: 0.3 mg/dL (ref 0.3–1.2)
BUN: 10 mg/dL (ref 6–23)
CO2: 20 mmol/L (ref 19–32)
Calcium: 8 mg/dL — ABNORMAL LOW (ref 8.4–10.5)
Chloride: 115 mmol/L — ABNORMAL HIGH (ref 96–112)
Creatinine, Ser: 0.87 mg/dL (ref 0.50–1.10)
GFR calc Af Amer: >90 mL/min (ref >90)
GFR calc non Af Amer: 80 mL/min — ABNORMAL LOW (ref >90)
Glucose, Bld: 89 mg/dL (ref 70–99)
Potassium: 3.5 mmol/L (ref 3.5–5.1)
Sodium: 138 mmol/L (ref 135–145)
Total Protein: 5.9 g/dL — ABNORMAL LOW (ref 6.0–8.3)

## 2014-04-28 LAB — C-REACTIVE PROTEIN: CRP: <0.5 mg/dL — ABNORMAL LOW (ref <0.60)

## 2014-04-28 LAB — VITAMIN B12: Vitamin B-12: 534 pg/mL (ref 211–911)

## 2014-04-28 LAB — FOLATE: Folate: >20 ng/mL

## 2014-04-28 MED ORDER — KETOROLAC TROMETHAMINE 30 MG/ML IJ SOLN
30.0000 mg | Freq: Four times a day (QID) | INTRAMUSCULAR | Status: DC | PRN
  Administered 2014-04-28: 30 mg via INTRAVENOUS
  Filled 2014-04-28: qty 1

## 2014-04-28 MED ORDER — METOCLOPRAMIDE HCL 5 MG/ML IJ SOLN
5.0000 mg | Freq: Four times a day (QID) | INTRAMUSCULAR | Status: DC
  Administered 2014-04-28 – 2014-04-30 (×8): 5 mg via INTRAVENOUS
  Filled 2014-04-28: qty 2
  Filled 2014-04-28: qty 1
  Filled 2014-04-28: qty 2
  Filled 2014-04-28 (×3): qty 1
  Filled 2014-04-28: qty 2
  Filled 2014-04-28 (×3): qty 1
  Filled 2014-04-28: qty 2
  Filled 2014-04-28: qty 1

## 2014-04-28 MED ORDER — POTASSIUM CHLORIDE IN NACL 20-0.9 MEQ/L-% IV SOLN
INTRAVENOUS | Status: DC
  Administered 2014-04-28 (×2): via INTRAVENOUS
  Administered 2014-04-28: 100 mL via INTRAVENOUS
  Administered 2014-04-29: 10:00:00 via INTRAVENOUS
  Filled 2014-04-28 (×4): qty 1000

## 2014-04-28 NOTE — Progress Notes (Signed)
Progress Note   Joyce Lang:[id number] DOB: 1970-08-27 DOA: 04/27/2014 PCP: Joyce Jubilee, MD   Brief Narrative:   Joyce Lang is an 44 y.o. female the PMH of bariatric surgery, irritable bowel syndrome, seizure, trigeminal neuralgia, pseudotumor cerebri, gestational trophoblastic neoplasm who was admitted on 04/27/14 with chief complaint of a one-week history of nausea, abdominal pain and diarrhea.  Assessment/Plan:   Principal Problem:   Ileus - CT reassuring: No obvious obstruction, normal to mildly dilated small bowel loops, probable ileus. No acute or inflammatory process. - Add Reglan.  Continue anti-emetics. - Avoid escalating narcotics. - Diet advanced to CL.  Active Problems:   Dizziness - Continue Antivert.    Anxiety - Continue Klonopin.    Diarrhea - Check stool culture, C. Difficile PCR.    Trigeminal neuralgia - Continue Neurontin.    Seizures - Continue Lamictal and Keppra.    Pseudotumor cerebri - Continue Acetazolamide and Antivert. - Add Toradol for complaints of headache.    MS (multiple sclerosis) - Continue Dimethyl Fumerate.   H/O gastric bypass    DVT Prophylaxis - Continue Heparin.   Code Status: Full. Family Communication: No family at the bedside. Disposition Plan: Home when stable.   IV Access:    Peripheral IV   Procedures and diagnostic studies:   Ct Abdomen Pelvis W Contrast 04/27/2014: 1. Upper limits of normal to mildly dilated small bowel loops but with no transition point or strong evidence of bowel obstruction. Perhaps the appearance reflects a small bowel ileus. 2. Otherwise no acute or inflammatory process identified in the abdomen or pelvis, with extensive postoperative changes re- identified.     Medical Consultants:    None.  Anti-Infectives:    None.  Subjective:   Joyce Lang denies vomiting, but still has nausea and gets dizzy when she stands up.  No further diarrhea since admission.  No  frank fever/chills, but has hot flashes.     Objective:    Filed Vitals:   04/27/14 1703 04/27/14 1812 04/27/14 2200 04/28/14 0553  BP: 109/75 119/73 117/72 118/73  Pulse: 77 75 72 87  Temp:  98.1 F (36.7 C) 98.2 F (36.8 C) 98.2 F (36.8 C)  TempSrc:   Oral Oral  Resp: Height:      Weight:    83.4 kg (183 lb 13.8 oz)  SpO2: 100% 100% 100% 100%    Intake/Output Summary (Last 24 hours) at 04/28/14 0842 Last data filed at 04/28/14 0553  Gross per 24 hour  Intake      0 ml  Output   1300 ml  Net  -1300 ml    Exam: Gen:  NAD Cardiovascular:  RRR, No M/R/G Respiratory:  Lungs CTAB Gastrointestinal:  Abdomen soft, NT/ND, + BS Extremities:  No C/E/C   Data Reviewed:    Labs: Basic Metabolic Panel:  Recent Labs Lab 04/27/14 1155 04/27/14 2102 04/28/14 0710  NA 136 139 138  K 4.0 3.4* 3.5  CL 116* 113* 115*  CO2 GLUCOSE 100* 95 89  BUN CREATININE 0.93 0.91 0.87  CALCIUM 7.9* 8.8 8.0*  MG  --  2.1  --   PHOS  --  3.1  --    GFR Estimated Creatinine Clearance: 90.7 mL/min (by C-G formula based on Cr of 0.87). Liver Function Tests:  Recent Labs Lab 04/27/14 1155 04/28/14 0710  AST 20 17  ALT 30 24  ALKPHOS 83 61  BILITOT 0.1* 0.3  PROT 6.9 5.9*  ALBUMIN 3.9 3.4*    Recent Labs Lab 04/27/14 1155  LIPASE 34   CBC:  Recent Labs Lab 04/27/14 1155 04/28/14 0710  WBC 7.8 5.4  NEUTROABS 5.2 2.3  HGB 11.5* 10.1*  HCT 37.1 33.2*  MCV 91.8 93.0  PLT 317 277   Anemia work up:  Recent Labs  04/27/14 2102  VITAMINB12 534  FOLATE >20.0   Sepsis Labs:  Recent Labs Lab 04/27/14 1155 04/27/14 1248 04/28/14 0710  WBC 7.8  --  5.4  LATICACIDVEN  --  0.35*  --    Microbiology No results found for this or any previous visit (from the past 240 hour(s)).   Medications:   . acetaZOLAMIDE  500 mg Oral BID  . acyclovir  800 mg Oral BID  . carisoprodol  350 mg Oral QHS  . Dimethyl Fumarate  240 mg Oral  BID  . fluticasone  2 spray Each Nare Daily  . gabapentin  1,200 mg Oral QHS  . heparin  5,000 Units Subcutaneous 3 times per day  . lamoTRIgine  200 mg Oral BID  . levETIRAcetam  500 mg Oral BID  . nortriptyline  75 mg Oral QHS   Continuous Infusions:   Time spent: 25 minutes.   LOS: 1 day   Joyce Lang  [facility] Pager [phone]. If unable to reach me by pager, please call my cell phone at [phone].  *Please refer to [email or web address], password [email or web address] to get updated schedule on who will round on this patient, as hospitalists switch teams weekly. If 7PM-7AM, please contact night-coverage at [email or web address], password [email or web address] for any overnight needs.  04/28/2014, 8:42 AM

## 2014-04-29 LAB — CLOSTRIDIUM DIFFICILE BY PCR: Toxigenic C. Difficile by PCR: NEGATIVE

## 2014-04-29 MED ORDER — LORATADINE 10 MG PO TABS
10.0000 mg | ORAL_TABLET | Freq: Every day | ORAL | Status: DC
  Administered 2014-04-29 – 2014-04-30 (×2): 10 mg via ORAL
  Filled 2014-04-29 (×2): qty 1

## 2014-04-29 MED ORDER — DICYCLOMINE HCL 10 MG PO CAPS
10.0000 mg | ORAL_CAPSULE | Freq: Three times a day (TID) | ORAL | Status: DC
  Administered 2014-04-29 – 2014-04-30 (×3): 10 mg via ORAL
  Filled 2014-04-29 (×6): qty 1

## 2014-04-29 MED ORDER — ESTRADIOL 1 MG PO TABS
1.0000 mg | ORAL_TABLET | Freq: Every day | ORAL | Status: DC
  Administered 2014-04-29 – 2014-04-30 (×2): 1 mg via ORAL
  Filled 2014-04-29 (×2): qty 1

## 2014-04-29 MED ORDER — CLONAZEPAM 0.5 MG PO TABS
0.5000 mg | ORAL_TABLET | Freq: Three times a day (TID) | ORAL | Status: DC
  Administered 2014-04-29 – 2014-04-30 (×4): 0.5 mg via ORAL
  Filled 2014-04-29 (×4): qty 1

## 2014-04-29 MED ORDER — NORTRIPTYLINE HCL 25 MG PO CAPS
100.0000 mg | ORAL_CAPSULE | Freq: Every day | ORAL | Status: DC
  Administered 2014-04-29: 100 mg via ORAL
  Filled 2014-04-29 (×2): qty 4

## 2014-04-29 NOTE — Progress Notes (Signed)
Progress Note   Joyce Lang TRV:[id number] DOB: 1970-07-02 DOA: 04/27/2014 PCP: Birdena Jubilee, MD   Brief Narrative:   Joyce Lang is an 44 y.o. female the PMH of bariatric surgery, irritable bowel syndrome, seizure, trigeminal neuralgia, pseudotumor cerebri, gestational trophoblastic neoplasm who was admitted on 04/27/14 with chief complaint of a one-week history of nausea, abdominal pain and diarrhea.  Assessment/Plan:   Principal Problem:   Ileus - CT reassuring: No obvious obstruction, normal to mildly dilated small bowel loops, probable ileus. No acute or inflammatory process. - Add Reglan.  Continue anti-emetics. - Avoid escalating narcotics. - Diet advanced to CL yesterday, try FL today.  Active Problems:   Dizziness - Continue Antivert.    Anxiety - Continue Klonopin.    Diarrhea - Follow-up stool culture, C. Difficile PCR negative.    Trigeminal neuralgia - Continue Neurontin.    Seizures - Continue Lamictal and Keppra.    Pseudotumor cerebri - Continue Acetazolamide and Antivert. - Continue Toradol PRN.    MS (multiple sclerosis) - Continue Dimethyl Fumerate.   H/O gastric bypass    DVT Prophylaxis - Continue Heparin.   Code Status: Full. Family Communication: No family at the bedside. Disposition Plan: Home when stable.   IV Access:    Peripheral IV   Procedures and diagnostic studies:   Ct Abdomen Pelvis W Contrast 04/27/2014: 1. Upper limits of normal to mildly dilated small bowel loops but with no transition point or strong evidence of bowel obstruction. Perhaps the appearance reflects a small bowel ileus. 2. Otherwise no acute or inflammatory process identified in the abdomen or pelvis, with extensive postoperative changes re- identified.     Medical Consultants:    None.  Anti-Infectives:    None.  Subjective:   Joyce Lang denies vomiting, but still has nausea.  Headache better.  Still reports abdominal distention.   Bowels moved last night.  No flatus.     Objective:    Filed Vitals:   04/28/14 0953 04/28/14 1300 04/28/14 2103 04/29/14 0622  BP: 142/78 104/69 134/83 135/80  Pulse: 80 90 86 84  Temp: 98 F (36.7 C) 98 F (36.7 C) 98 F (36.7 C) 98.2 F (36.8 C)  TempSrc:  Oral Oral Oral  Resp: 18 14 16 16   Height:      Weight:      SpO2: 99% 99% 99% 100%    Intake/Output Summary (Last 24 hours) at 04/29/14 0819 Last data filed at 04/29/14 0500  Gross per 24 hour  Intake   2640 ml  Output   2400 ml  Net    240 ml    Exam: Gen:  NAD Cardiovascular:  RRR, No M/R/G Respiratory:  Lungs CTAB Gastrointestinal:  Abdomen soft, NT/ND, + BS Extremities:  No C/E/C   Data Reviewed:    Labs: Basic Metabolic Panel:  Recent Labs Lab 04/27/14 1155 04/27/14 2102 04/28/14 0710  NA 136 139 138  K 4.0 3.4* 3.5  CL 116* 113* 115*  CO2 19 22 20   GLUCOSE 100* 95 89  BUN 16 13 10   CREATININE 0.93 0.91 0.87  CALCIUM 7.9* 8.8 8.0*  MG  --  2.1  --   PHOS  --  3.1  --    GFR Estimated Creatinine Clearance: 90.7 mL/min (by C-G formula based on Cr of 0.87). Liver Function Tests:  Recent Labs Lab 04/27/14 1155 04/28/14 0710  AST 20 17  ALT 30 24  ALKPHOS 83 61  BILITOT  0.1* 0.3  PROT 6.9 5.9*  ALBUMIN 3.9 3.4*    Recent Labs Lab 04/27/14 1155  LIPASE 34   CBC:  Recent Labs Lab 04/27/14 1155 04/28/14 0710  WBC 7.8 5.4  NEUTROABS 5.2 2.3  HGB 11.5* 10.1*  HCT 37.1 33.2*  MCV 91.8 93.0  PLT 317 277   Anemia work up:  Recent Labs  04/27/14 2102  VITAMINB12 534  FOLATE >20.0   Sepsis Labs:  Recent Labs Lab 04/27/14 1155 04/27/14 1248 04/28/14 0710  WBC 7.8  --  5.4  LATICACIDVEN  --  0.35*  --    Microbiology No results found for this or any previous visit (from the past 240 hour(s)).   Medications:   . acetaZOLAMIDE  500 mg Oral BID  . acyclovir  800 mg Oral BID  . carisoprodol  350 mg Oral QHS  . Dimethyl Fumarate  240 mg Oral BID  .  fluticasone  2 spray Each Nare Daily  . gabapentin  1,200 mg Oral QHS  . heparin  5,000 Units Subcutaneous 3 times per day  . lamoTRIgine  200 mg Oral BID  . levETIRAcetam  500 mg Oral BID  . metoCLOPramide (REGLAN) injection  5 mg Intravenous 4 times per day  . nortriptyline  75 mg Oral QHS   Continuous Infusions: . 0.9 % NaCl with KCl 20 mEq / L 100 mL (04/28/14 2200)    Time spent: 25 minutes.   LOS: 2 days   RAMA,CHRISTINA  [facility] Pager [phone]. If unable to reach me by pager, please call my cell phone at [phone].  *Please refer to [email or web address], password [email or web address] to get updated schedule on who will round on this patient, as hospitalists switch teams weekly. If 7PM-7AM, please contact night-coverage at [email or web address], password [email or web address] for any overnight needs.  04/29/2014, 8:19 AM

## 2014-04-29 NOTE — Progress Notes (Signed)
CARE MANAGEMENT NOTE 04/29/2014  Patient:  [facility]   Account Number:  [id number]  Date Initiated:  04/29/2014  Documentation initiated by:  Trinna Balloon  Subjective/Objective Assessment:   Pt admitted with cco abd pain     Action/Plan:   from home   Anticipated DC Date:  05/02/2014   Anticipated DC Plan:  HOME/SELF CARE      DC Planning Services  CM consult      Choice offered to / List presented to:             Status of service:  In process, will continue to follow Medicare Important Message given?   (If response is "NO", the following Medicare IM given date fields will be blank) Date Medicare IM given:   Medicare IM given by:   Date Additional Medicare IM given:   Additional Medicare IM given by:    Discharge Disposition:    Per UR Regulation:  Reviewed for med. necessity/level of care/duration of stay  If discussed at Long Length of Stay Meetings, dates discussed:    Comments:  04/29/14 MMcGibboney, RN, BSN chart reviewed.

## 2014-04-30 MED ORDER — METOCLOPRAMIDE HCL 5 MG PO TABS: 5.0000 mg | ORAL_TABLET | Freq: Four times a day (QID) | ORAL | Status: DC

## 2014-04-30 MED ORDER — POLYETHYLENE GLYCOL 3350 17 G PO PACK: 17.0000 g | PACK | Freq: Every day | ORAL | Status: DC

## 2014-04-30 MED ORDER — HYDROCODONE-ACETAMINOPHEN 5-500 MG PO TABS: 1.0000 | ORAL_TABLET | Freq: Four times a day (QID) | ORAL | Status: DC | PRN

## 2014-04-30 MED ORDER — POLYETHYLENE GLYCOL 3350 17 G PO PACK
17.0000 g | PACK | Freq: Every day | ORAL | Status: DC
  Filled 2014-04-30: qty 1

## 2014-04-30 NOTE — Progress Notes (Signed)
Pt discharged home with her daughter. Pt verbalized understanding discharge instructions, follow-up appointments, and prescription medications. Pt VS stable at the time of discharge. Pt denies any complaints or concerns at this time.

## 2014-04-30 NOTE — Discharge Summary (Signed)
Physician Discharge Summary  Joyce Lang:[id number] DOB: Nov 04, 1970 DOA: 04/27/2014  PCP: Birdena Jubilee, MD  Admit date: 04/27/2014 Discharge date: 04/30/2014   Recommendations for Outpatient Follow-Up:   1. The patient has F/U scheduled with her neurologist 05/12/14.  She has been given #30 Vicodin to make it to her appointment time, and has been advised that we typically do not refill chronic pain medications (it is the weekend, and we are unable to consult directly with per physicians). 2. F/U final stool culture results.   Discharge Diagnosis:   Principal Problem:    Ileus Active Problems:    Trigeminal neuralgia    Seizures    Pseudotumor cerebri    MS (multiple sclerosis)    H/O gastric bypass    Diarrhea    Generalized anxiety disorder    Dizzinesses   Discharge Condition: Improved.  Diet recommendation: Regular.   History of Present Illness:   Joyce Lang is an 44 y.o. female the PMH of bariatric surgery, irritable bowel syndrome, seizure, trigeminal neuralgia, pseudotumor cerebri, gestational trophoblastic neoplasm who was admitted on 04/27/14 with chief complaint of a one-week history of nausea, abdominal pain and diarrhea.  Hospital Course by Problem:   Principal Problem:  Ileus - CT reassuring: No obvious obstruction, normal to mildly dilated small bowel loops, probable ileus. No acute or inflammatory process. - Treated conservatively with anti-emetics and Reglan, can D/C with improved symptoms.  - Avoid escalating narcotics. - Diet advanced FL which she was able to tolerate at discharge.  Active Problems:  Dizziness - Continue Antivert.   Anxiety - Continue Klonopin.   Diarrhea - Follow-up stool culture, C. Difficile PCR negative.   Trigeminal neuralgia - Continue Neurontin.   Seizures - Continue Lamictal and Keppra.   Pseudotumor cerebri - Continue Acetazolamide and Antivert. - Continue Toradol PRN.   MS (multiple  sclerosis) - Continue Dimethyl Fumerate.  H/O gastric bypass    Medical Consultants:    None.   Discharge Exam:   Filed Vitals:   04/30/14 0630  BP: 101/81  Pulse: 88  Temp: 97.6 F (36.4 C)  Resp: 18   Filed Vitals:   04/29/14 0622 04/29/14 1423 04/29/14 2200 04/30/14 0630  BP: 135/80 103/54 100/75 101/81  Pulse: 84 87 85 88  Temp: 98.2 F (36.8 C) 98 F (36.7 C) 98.2 F (36.8 C) 97.6 F (36.4 C)  TempSrc: Oral Oral Oral Oral  Resp: Height:      Weight:    83.4 kg (183 lb 13.8 oz)  SpO2: 100% 100% 100% 100%    Gen:  NAD Cardiovascular:  RRR, No M/R/G Respiratory: Lungs CTAB Gastrointestinal: Abdomen soft, NT/ND with normal active bowel sounds. Extremities: No C/E/C   The results of significant diagnostics from this hospitalization (including imaging, microbiology, ancillary and laboratory) are listed below for reference.     Procedures and Diagnostic Studies:   Ct Abdomen Pelvis W Contrast 04/27/2014: 1. Upper limits of normal to mildly dilated small bowel loops but with no transition point or strong evidence of bowel obstruction. Perhaps the appearance reflects a small bowel ileus. 2. Otherwise no acute or inflammatory process identified in the abdomen or pelvis, with extensive postoperative changes re- identified.  Labs:   Basic Metabolic Panel:  Recent Labs Lab 04/27/14 1155 04/27/14 2102 04/28/14 0710  NA 136 139 138  K 4.0 3.4* 3.5  CL 116* 113* 115*  CO2 GLUCOSE 100* 95 89  BUN 16 13 10   CREATININE 0.93 0.91 0.87  CALCIUM 7.9* 8.8 8.0*  MG  --  2.1  --   PHOS  --  3.1  --    GFR Estimated Creatinine Clearance: 90.7 mL/min (by C-G formula based on Cr of 0.87). Liver Function Tests:  Recent Labs Lab 04/27/14 1155 04/28/14 0710  AST 20 17  ALT 30 24  ALKPHOS 83 61  BILITOT 0.1* 0.3  PROT 6.9 5.9*  ALBUMIN 3.9 3.4*    Recent Labs Lab 04/27/14 1155  LIPASE 34   CBC:  Recent Labs Lab  04/27/14 1155 04/28/14 0710  WBC 7.8 5.4  NEUTROABS 5.2 2.3  HGB 11.5* 10.1*  HCT 37.1 33.2*  MCV 91.8 93.0  PLT 317 277   Anemia work up  Recent Labs  04/27/14 2102  VITAMINB12 534  FOLATE >20.0   Microbiology Recent Results (from the past 240 hour(s))  Clostridium Difficile by PCR     Status: None   Collection Time: 04/28/14  6:40 PM  Result Value Ref Range Status   C difficile by pcr NEGATIVE NEGATIVE Final    Comment: Performed at [facility]     Discharge Instructions:       Discharge Instructions    Call MD for:  extreme fatigue    Complete by:  As directed      Call MD for:  persistant nausea and vomiting    Complete by:  As directed      Call MD for:  severe uncontrolled pain    Complete by:  As directed      Diet general    Complete by:  As directed      Discharge instructions    Complete by:  As directed   You were cared for by Dr. Hillery Aldo  (a hospitalist) during your hospital stay. If you have any questions about your discharge medications or the care you received while you were in the hospital after you are discharged, you can call the unit and ask to speak with the hospitalist on call if the hospitalist that took care of you is not available. Once you are discharged, your primary care physician will handle any further medical issues. Please note that NO REFILLS for any discharge medications will be authorized once you are discharged, as it is imperative that you return to your primary care physician (or establish a relationship with a primary care physician if you do not have one) for your aftercare needs so that they can reassess your need for medications and monitor your lab values.  Any outstanding tests can be reviewed by your PCP at your follow up visit.  It is also important to review any medicine changes with your PCP.  Please bring these d/c instructions with you to your next visit so your physician can review these changes with you.  If  you do not have a primary care physician, you can call [phone] for a physician referral.  It is highly recommended that you obtain a PCP for hospital follow up.     Increase activity slowly    Complete by:  As directed             Medication List    STOP taking these medications        oxyCODONE-acetaminophen 5-325 MG per tablet  Commonly known as:  PERCOCET      TAKE these medications        acetaZOLAMIDE 250 MG tablet  Commonly  known as:  DIAMOX  Take 500 mg by mouth 2 (two) times daily.     acyclovir 800 MG tablet  Commonly known as:  ZOVIRAX  Take 800 mg by mouth 2 (two) times daily.     BIOTIN PO  Take 1 tablet by mouth daily.     Calcium Citrate-Vitamin D 500-500 MG-UNIT Chew  Chew 2 tablets by mouth 2 (two) times daily.     carisoprodol 350 MG tablet  Commonly known as:  SOMA  Take 350 mg by mouth 3 (three) times daily as needed for muscle spasms.     cetirizine 10 MG tablet  Commonly known as:  ZYRTEC  Take 10 mg by mouth daily.     clonazePAM 1 MG tablet  Commonly known as:  KLONOPIN  Take 0.5 mg by mouth 3 (three) times daily as needed for anxiety.     diphenhydrAMINE 25 mg capsule  Commonly known as:  BENADRYL  Take 25 mg by mouth 2 (two) times daily. With Tecfidera.     estradiol 1 MG tablet  Commonly known as:  ESTRACE  Take 1 mg by mouth daily.     gabapentin 600 MG tablet  Commonly known as:  NEURONTIN  Take 1,200 mg by mouth at bedtime.     HYDROcodone-acetaminophen 5-500 MG per tablet  Commonly known as:  VICODIN  Take 1 tablet by mouth every 6 (six) hours as needed for pain.     KEPPRA PO  Take 500 mg by mouth 2 (two) times daily.     LamoTRIgine XR 200 MG Tb24  Take 200 mg by mouth 2 (two) times daily.     meclizine 25 MG tablet  Commonly known as:  ANTIVERT  Take 25 mg by mouth 3 (three) times daily as needed for dizziness.     metoCLOPramide 5 MG tablet  Commonly known as:  REGLAN  Take 1 tablet (5 mg total) by mouth 4  (four) times daily.     multivitamin with minerals Tabs tablet  Take 1 tablet by mouth daily.     NON FORMULARY  Estradiol cream as directed (daily)     nortriptyline 25 MG capsule  Commonly known as:  PAMELOR  Take 75-100 mg by mouth at bedtime.     polyethylene glycol packet  Commonly known as:  MIRALAX / GLYCOLAX  Take 17 g by mouth daily.     promethazine 25 MG tablet  Commonly known as:  PHENERGAN  Take 25 mg by mouth every 6 (six) hours as needed for nausea.     pseudoephedrine 30 MG tablet  Commonly known as:  SUDAFED  Take 60 mg by mouth at bedtime as needed for congestion.     SUMAtriptan 100 MG tablet  Commonly known as:  IMITREX  Take 100 mg by mouth every 2 (two) hours as needed for migraine or headache.     TECFIDERA 240 MG Cpdr  Generic drug:  Dimethyl Fumarate  Take 240 mg by mouth 2 (two) times daily.       Follow-up Information    Follow up with Birdena Jubilee, MD.   Specialty:  Family Medicine   Why:  If symptoms worsen   Contact information:   [address] [place] [address] [phone]       Follow up with Aletha Halim, MD.   Specialty:  Specialist   Why:  At your scheduled appt time 05/12/14.   Contact information:   [address]  [address] [place] [address] [phone]        Time coordinating discharge: 35 minutes.  Signed:  RAMA,CHRISTINA  Pager [phone] [facility] 04/30/2014, 9:39 AM

## 2014-05-02 LAB — STOOL CULTURE: Special Requests: NORMAL

## 2014-05-03 ENCOUNTER — Emergency Department (HOSPITAL_COMMUNITY)
Admission: EM | Admit: 2014-05-03 | Discharge: 2014-05-04 | Disposition: A | Discharge status: Home / Self Care | Payer: Federal, State, Local not specified - PPO | Attending: Emergency Medicine | Admitting: Emergency Medicine

## 2014-05-03 ENCOUNTER — Encounter (HOSPITAL_COMMUNITY): Payer: Self-pay

## 2014-05-03 DIAGNOSIS — R109 Unspecified abdominal pain: Secondary | ICD-10-CM | POA: Diagnosis present

## 2014-05-03 DIAGNOSIS — G43909 Migraine, unspecified, not intractable, without status migrainosus: Secondary | ICD-10-CM | POA: Diagnosis not present

## 2014-05-03 DIAGNOSIS — F4310 Post-traumatic stress disorder, unspecified: Secondary | ICD-10-CM | POA: Insufficient documentation

## 2014-05-03 DIAGNOSIS — G40909 Epilepsy, unspecified, not intractable, without status epilepticus: Secondary | ICD-10-CM | POA: Insufficient documentation

## 2014-05-03 DIAGNOSIS — Z3202 Encounter for pregnancy test, result negative: Secondary | ICD-10-CM | POA: Insufficient documentation

## 2014-05-03 DIAGNOSIS — Z79899 Other long term (current) drug therapy: Secondary | ICD-10-CM

## 2014-05-03 DIAGNOSIS — N390 Urinary tract infection, site not specified: Secondary | ICD-10-CM | POA: Insufficient documentation

## 2014-05-03 DIAGNOSIS — Z862 Personal history of diseases of the blood and blood-forming organs and certain disorders involving the immune mechanism: Secondary | ICD-10-CM | POA: Insufficient documentation

## 2014-05-03 DIAGNOSIS — Z8679 Personal history of other diseases of the circulatory system: Secondary | ICD-10-CM | POA: Diagnosis not present

## 2014-05-03 DIAGNOSIS — K5900 Constipation, unspecified: Secondary | ICD-10-CM | POA: Insufficient documentation

## 2014-05-03 LAB — CBC WITH DIFFERENTIAL/PLATELET
Basophils Absolute: 0 10*3/uL (ref 0.0–0.1)
Basophils Relative: 1 % (ref 0–1)
EOS PCT: 2 % (ref 0–5)
Eosinophils Absolute: 0.1 10*3/uL (ref 0.0–0.7)
HCT: 34 % — ABNORMAL LOW (ref 36.0–46.0)
Hemoglobin: 10.6 g/dL — ABNORMAL LOW (ref 12.0–15.0)
LYMPHS ABS: 2.1 10*3/uL (ref 0.7–4.0)
LYMPHS PCT: 39 % (ref 12–46)
MCH: 28.6 pg (ref 26.0–34.0)
MCHC: 31.2 g/dL (ref 30.0–36.0)
MCV: 91.9 fL (ref 78.0–100.0)
MONO ABS: 0.6 10*3/uL (ref 0.1–1.0)
MONOS PCT: 12 % (ref 3–12)
Neutro Abs: 2.4 10*3/uL (ref 1.7–7.7)
Neutrophils Relative %: 46 % (ref 43–77)
Platelets: 267 10*3/uL (ref 150–400)
RBC: 3.7 MIL/uL — ABNORMAL LOW (ref 3.87–5.11)
RDW: 16.6 % — ABNORMAL HIGH (ref 11.5–15.5)
WBC: 5.3 10*3/uL (ref 4.0–10.5)

## 2014-05-03 LAB — I-STAT CG4 LACTIC ACID, ED: LACTIC ACID, VENOUS: 0.32 mmol/L — AB (ref 0.5–2.0)

## 2014-05-03 NOTE — ED Notes (Signed)
PA at bedside.

## 2014-05-03 NOTE — ED Notes (Signed)
Pt was hospitalized last week for an ileus, two days ago she states that she started to bloat again and now feels constipated.

## 2014-05-03 NOTE — ED Provider Notes (Signed)
CSN: [id number]     Arrival date & time 05/03/14  2157 History   None    Chief Complaint  Patient presents with  . Abdominal Pain     (Consider location/radiation/quality/duration/timing/severity/associated sxs/prior Treatment) The history is provided by the patient. No language interpreter was used.  Joyce Lang is a 44 y/o F with PMHx of migraine, trigeminal neuralgia, molar pregnancy, seizures, PTSD, pseudotumor cerebri, MS, MVP, iron deficiency anemia presenting to the ED with abdominal pain that has been ongoing for the past 4 days. Patient reported that she has noticed distension of her abdomen. Reported that she has been having a constant pain within her abdomen - reported that the pain makes it hard to breath. Stated that she has been able to keep fluids down, but not food - reported that she continues to have emesis with food. Patient stated that she has not been having her normal bowel movements. Stated that she was admitted to the hospital regarding ileus and discharged on Saturday - stated that when she was discharged she still had the pain. Reported that she has had at least 5-6 surgeries of her abdomen in her lifetime - most recent was November 2015. Reported that she did have gastric bypass surgery by Dr. Jacqulyn Ducking at [facility] - stated that she did have an appointment today that she forgot about, stated that she missed this appointment today. Denied fever, chills, dysuria, hematuria, vaginal bleeding, vaginal discharge, difficulty breathing, chest pain, nausea, melena, hematochezia. PCP Dr. Alberteen Sam Gastric bypass surgeon Dr. Jacqulyn Ducking  Past Medical History  Diagnosis Date  . Migraine   . Trigeminal neuralgia   . Molar pregnancy   . Seizures   . PTSD (post-traumatic stress disorder)   . Pseudotumor cerebri   . MS (multiple sclerosis)   . Mitral valve prolapse   . Iron deficiency anemia 10/18/2013   Past Surgical History  Procedure Laterality Date  . Appendectomy    . Breast surgery     . Cholecystectomy    . Gastric bypass    . Tonsillectomy    . Carpal tunnel release    . Vaginal hysterectomy    . Bilateral salpingoophorectomy     Family History  Problem Relation Age of Onset  . Cancer Maternal Aunt     Colon Cancer   . Alcohol abuse Maternal Uncle   . Cancer Paternal Grandfather     Colon Cancer   History  Substance Use Topics  . Smoking status: Never Smoker   . Smokeless tobacco: Never Used  . Alcohol Use: No   OB History    No data available     Review of Systems  Constitutional: Negative for fever and chills.  Eyes: Negative for visual disturbance.  Respiratory: Negative for chest tightness and shortness of breath.   Cardiovascular: Negative for chest pain.  Gastrointestinal: Positive for abdominal pain and constipation. Negative for nausea, vomiting, diarrhea, blood in stool and anal bleeding.  Genitourinary: Negative for dysuria, hematuria, decreased urine volume, vaginal bleeding, vaginal discharge, vaginal pain and pelvic pain.  Musculoskeletal: Negative for back pain, neck pain and neck stiffness.  Neurological: Negative for weakness and headaches.      Allergies  Demerol; Morphine and related; and Other  Home Medications   Prior to Admission medications   Medication Sig Start Date End Date Taking? Authorizing Provider  acetaZOLAMIDE (DIAMOX) 250 MG tablet Take 500 mg by mouth 2 (two) times daily.    Yes Historical Provider, MD  acyclovir (ZOVIRAX) 800  MG tablet Take 800 mg by mouth 2 (two) times daily.  01/29/13  Yes Historical Provider, MD  BIOTIN PO Take 1 tablet by mouth daily.   Yes Historical Provider, MD  Calcium Citrate-Vitamin D 500-500 MG-UNIT CHEW Chew 2 tablets by mouth 2 (two) times daily.   Yes Historical Provider, MD  cetirizine (ZYRTEC) 10 MG tablet Take 10 mg by mouth daily.     Yes Historical Provider, MD  clonazePAM (KLONOPIN) 1 MG tablet Take 0.5 mg by mouth 3 (three) times daily as needed for anxiety.    Yes  Historical Provider, MD  Dimethyl Fumarate (TECFIDERA) 240 MG CPDR Take 240 mg by mouth 2 (two) times daily.    Yes Historical Provider, MD  diphenhydrAMINE (BENADRYL) 25 mg capsule Take 25 mg by mouth 2 (two) times daily. With Tecfidera.   Yes Historical Provider, MD  estradiol (ESTRACE) 1 MG tablet Take 1 mg by mouth daily.   Yes Historical Provider, MD  gabapentin (NEURONTIN) 600 MG tablet Take 1,200 mg by mouth at bedtime.   Yes Historical Provider, MD  HYDROcodone-acetaminophen (NORCO/VICODIN) 5-325 MG per tablet Take 1 tablet by mouth every 6 (six) hours as needed for moderate pain.   Yes Historical Provider, MD  LamoTRIgine XR 200 MG TB24 Take 200 mg by mouth 2 (two) times daily.   Yes Historical Provider, MD  meclizine (ANTIVERT) 25 MG tablet Take 25 mg by mouth 3 (three) times daily as needed for dizziness.  01/25/13  Yes Historical Provider, MD  metoCLOPramide (REGLAN) 5 MG tablet Take 1 tablet (5 mg total) by mouth 4 (four) times daily. 04/30/14  Yes Maryruth Bun Rama, MD  Multiple Vitamin (MULTIVITAMIN WITH MINERALS) TABS tablet Take 1 tablet by mouth daily.   Yes Historical Provider, MD  nortriptyline (PAMELOR) 50 MG capsule Take 100 mg by mouth at bedtime.   Yes Historical Provider, MD  promethazine (PHENERGAN) 25 MG tablet Take 25 mg by mouth every 6 (six) hours as needed for nausea.    Yes Historical Provider, MD  pseudoephedrine (SUDAFED) 30 MG tablet Take 60 mg by mouth at bedtime as needed for congestion.   Yes Historical Provider, MD  SUMAtriptan (IMITREX) 100 MG tablet Take 100 mg by mouth every 2 (two) hours as needed for migraine or headache.    Yes Historical Provider, MD  traMADol (ULTRAM) 50 MG tablet Take 1 tablet by mouth every 6 (six) hours as needed. pain 01/26/14  Yes Historical Provider, MD  carisoprodol (SOMA) 350 MG tablet Take 350 mg by mouth 3 (three) times daily as needed for muscle spasms.  12/28/12   Historical Provider, MD  HYDROcodone-acetaminophen (VICODIN)  5-500 MG per tablet Take 1 tablet by mouth every 6 (six) hours as needed for pain. Patient not taking: Reported on 05/03/2014 04/30/14   Maryruth Bun Rama, MD  LevETIRAcetam (KEPPRA PO) Take 500 mg by mouth 2 (two) times daily.     Historical Provider, MD  NON FORMULARY Estradiol cream as directed (daily), compounded from [facility]. Dose unknown    Historical Provider, MD  polyethylene glycol (MIRALAX / GLYCOLAX) packet Take 17 g by mouth daily. 05/04/14   Flonnie Wierman, PA-C   BP 115/67 mmHg  Pulse 82  Temp(Src) 98.2 F (36.8 C) (Oral)  Resp 18  Ht 5\' 6"  (1.676 m)  Wt 178 lb 3.2 oz (80.831 kg)  BMI 28.78 kg/m2  SpO2 99% Physical Exam  Constitutional: She is oriented to person, place, and time. She appears well-developed and  well-nourished. No distress.  Patient appears drowsy  HENT:  Head: Normocephalic and atraumatic.  Eyes: Conjunctivae and EOM are normal. Pupils are equal, round, and reactive to light. Right eye exhibits no discharge. Left eye exhibits no discharge.  Neck: Normal range of motion. Neck supple.  Cardiovascular: Normal rate, regular rhythm and normal heart sounds.  Exam reveals no friction rub.   No murmur heard. Pulmonary/Chest: Effort normal and breath sounds normal. No respiratory distress. She has no wheezes.  Abdominal: Soft. She exhibits no distension. There is generalized tenderness. There is no rebound and no guarding.  Musculoskeletal: Normal range of motion.  Neurological: She is alert and oriented to person, place, and time. No cranial nerve deficit. She exhibits normal muscle tone. Coordination normal.  Skin: Skin is warm and dry. No rash noted. She is not diaphoretic. No erythema.  Psychiatric: She has a normal mood and affect. Her behavior is normal. Thought content normal.  Nursing note and vitals reviewed.   ED Course  Procedures (including critical care time)  CLINICAL DATA: 44 year old female with abdomen and right flank pain for 1 week.  Vomiting and diarrhea. Previous abdominal surgery including gastric bypass. Initial encounter.  EXAM: CT ABDOMEN AND PELVIS WITH CONTRAST  TECHNIQUE: Multidetector CT imaging of the abdomen and pelvis was performed using the standard protocol following bolus administration of intravenous contrast.  CONTRAST: OMNIPAQUE IOHEXOL 300 MG/ML SOLN  COMPARISON: 12/20/2013 CT Abdomen and Pelvis and earlier  FINDINGS: Mild cardiomegaly is stable. Negative lung bases. No pericardial or pleural effusion.  No acute osseous abnormality identified.  Increased stool in the distal colon. No abdominal free fluid. Uterus surgically absent. Adnexal may also be surgically absent. Unremarkable bladder.  Mildly redundant sigmoid colon with retained stool. Negative left colon, splenic flexure, transverse colon and right colon. Sequelae of appendectomy. Most distal small bowel loops are fluid-filled but not abnormally dilated. There is some loculated material in some distal small bowel loops. Oral contrast was administered. Patent gastrojejunostomy. The proximal small bowel is patulous in appearance, including at the small bowel anastomosis in the left upper quadrant, but oral contrast has reached more distal nondilated loops. The bypassed stomach is decompressed. There is a trace amount of contrast in the duodenum which appears normal.  Gallbladder surgically absent. Stable extrahepatic biliary ductal enlargement. Stable and negative liver parenchyma. Spleen, pancreas, main pancreatic duct, adrenal glands, and kidneys are stable. Renal contrast excretion is within normal limits. No abdominal free fluid or free air. No bowel wall thickening or mesenteric inflammation. Portal venous system is patent. Major arterial structures are within normal limits.  IMPRESSION: 1. Upper limits of normal to mildly dilated small bowel loops but with no transition point or strong evidence of  bowel obstruction. Perhaps the appearance reflects a small bowel ileus. 2. Otherwise no acute or inflammatory process identified in the abdomen or pelvis, with extensive postoperative changes re- identified.   Electronically Signed  By: Odessa Fleming M.D.  On: 04/27/2014 13:56  Results for orders placed or performed during the hospital encounter of 05/03/14  CBC with Differential/Platelet  Result Value Ref Range   WBC 5.3 4.0 - 10.5 K/uL   RBC 3.70 (L) 3.87 - 5.11 MIL/uL   Hemoglobin 10.6 (L) 12.0 - 15.0 g/dL   HCT 16.1 (L) 09.6 - 04.5 %   MCV 91.9 78.0 - 100.0 fL   MCH 28.6 26.0 - 34.0 pg   MCHC 31.2 30.0 - 36.0 g/dL   RDW 40.9 (H) 81.1 - 91.4 %  Platelets 267 150 - 400 K/uL   Neutrophils Relative % 46 43 - 77 %   Neutro Abs 2.4 1.7 - 7.7 K/uL   Lymphocytes Relative 39 12 - 46 %   Lymphs Abs 2.1 0.7 - 4.0 K/uL   Monocytes Relative 12 3 - 12 %   Monocytes Absolute 0.6 0.1 - 1.0 K/uL   Eosinophils Relative 2 0 - 5 %   Eosinophils Absolute 0.1 0.0 - 0.7 K/uL   Basophils Relative 1 0 - 1 %   Basophils Absolute 0.0 0.0 - 0.1 K/uL  Comprehensive metabolic panel  Result Value Ref Range   Sodium 137 135 - 145 mmol/L   Potassium 3.7 3.5 - 5.1 mmol/L   Chloride 112 96 - 112 mmol/L   CO2 20 19 - 32 mmol/L   Glucose, Bld 95 70 - 99 mg/dL   BUN 10 6 - 23 mg/dL   Creatinine, Ser 1.61 0.50 - 1.10 mg/dL   Calcium 8.6 8.4 - 09.6 mg/dL   Total Protein 7.0 6.0 - 8.3 g/dL   Albumin 4.3 3.5 - 5.2 g/dL   AST 24 0 - 37 U/L   ALT 23 0 - 35 U/L   Alkaline Phosphatase 73 39 - 117 U/L   Total Bilirubin 0.5 0.3 - 1.2 mg/dL   GFR calc non Af Amer 80 (L) >90 mL/min   GFR calc Af Amer >90 >90 mL/min   Anion gap 5 5 - 15  Lipase, blood  Result Value Ref Range   Lipase 24 11 - 59 U/L  Urine rapid drug screen (hosp performed)  Result Value Ref Range   Opiates POSITIVE (A) NONE DETECTED   Cocaine NONE DETECTED NONE DETECTED   Benzodiazepines NONE DETECTED NONE DETECTED   Amphetamines NONE  DETECTED NONE DETECTED   Tetrahydrocannabinol NONE DETECTED NONE DETECTED   Barbiturates NONE DETECTED NONE DETECTED  Ethanol  Result Value Ref Range   Alcohol, Ethyl (B) <5 0 - 9 mg/dL  POC urine preg, ED (not at [facility])  Result Value Ref Range   Preg Test, Ur NEGATIVE NEGATIVE  I-Stat CG4 Lactic Acid, ED  Result Value Ref Range   Lactic Acid, Venous 0.32 (L) 0.5 - 2.0 mmol/L   Ct Abdomen Pelvis W Contrast  04/27/2014   CLINICAL DATA:  44 year old female with abdomen and right flank pain for 1 week. Vomiting and diarrhea. Previous abdominal surgery including gastric bypass. Initial encounter.  EXAM: CT ABDOMEN AND PELVIS WITH CONTRAST  TECHNIQUE: Multidetector CT imaging of the abdomen and pelvis was performed using the standard protocol following bolus administration of intravenous contrast.  CONTRAST:  OMNIPAQUE IOHEXOL 300 MG/ML  SOLN  COMPARISON:  12/20/2013 CT Abdomen and Pelvis and earlier  FINDINGS: Mild cardiomegaly is stable. Negative lung bases. No pericardial or pleural effusion.  No acute osseous abnormality identified.  Increased stool in the distal colon. No abdominal free fluid. Uterus surgically absent. Adnexal may also be surgically absent. Unremarkable bladder.  Mildly redundant sigmoid colon with retained stool. Negative left colon, splenic flexure, transverse colon and right colon. Sequelae of appendectomy. Most distal small bowel loops are fluid-filled but not abnormally dilated. There is some loculated material in some distal small bowel loops. Oral contrast was administered. Patent gastrojejunostomy. The proximal small bowel is patulous in appearance, including at the small bowel anastomosis in the left upper quadrant, but oral contrast has reached more distal nondilated loops. The bypassed stomach is decompressed. There is a trace amount of contrast in the  duodenum which appears normal.  Gallbladder surgically absent. Stable extrahepatic biliary ductal enlargement. Stable  and negative liver parenchyma. Spleen, pancreas, main pancreatic duct, adrenal glands, and kidneys are stable. Renal contrast excretion is within normal limits. No abdominal free fluid or free air. No bowel wall thickening or mesenteric inflammation. Portal venous system is patent. Major arterial structures are within normal limits.  IMPRESSION: 1. Upper limits of normal to mildly dilated small bowel loops but with no transition point or strong evidence of bowel obstruction. Perhaps the appearance reflects a small bowel ileus. 2. Otherwise no acute or inflammatory process identified in the abdomen or pelvis, with extensive postoperative changes re- identified.   Electronically Signed   By: Odessa Fleming M.D.   On: 04/27/2014 13:56   Dg Abd Acute W/chest  05/04/2014   CLINICAL DATA:  Acute onset of lower abdominal pain for 3 days. Unable to have bowel movement. Initial encounter.  EXAM: ACUTE ABDOMEN SERIES (ABDOMEN 2 VIEW & CHEST 1 VIEW)  COMPARISON:  CT of the abdomen and pelvis performed 04/27/2014, and chest radiograph performed 05/26/2012  FINDINGS: The lungs are well-aerated. Pulmonary vascularity is at the upper limits of normal. There is no evidence of focal opacification, pleural effusion or pneumothorax. The cardiomediastinal silhouette is within normal limits.  The visualized bowel gas pattern is unremarkable. Scattered stool and air are seen within the colon; there is no evidence of small bowel dilatation to suggest obstruction. No free intra-abdominal air is identified on the provided upright view. Clips are noted within the right upper quadrant, reflecting prior cholecystectomy.  No acute osseous abnormalities are seen; the sacroiliac joints are unremarkable in appearance.  IMPRESSION: 1. Unremarkable bowel gas pattern; no free intra-abdominal air seen. Moderate amount of stool noted within the colon, though the sigmoid colon is largely decompressed. 2. No acute cardiopulmonary process identified.    Electronically Signed   By: Roanna Raider M.D.   On: 05/04/2014 02:11    Labs Review Labs Reviewed  CBC WITH DIFFERENTIAL/PLATELET - Abnormal; Notable for the following:    RBC 3.70 (*)    Hemoglobin 10.6 (*)    HCT 34.0 (*)    RDW 16.6 (*)    All other components within normal limits  COMPREHENSIVE METABOLIC PANEL - Abnormal; Notable for the following:    GFR calc non Af Amer 80 (*)    All other components within normal limits  URINE RAPID DRUG SCREEN (HOSP PERFORMED) - Abnormal; Notable for the following:    Opiates POSITIVE (*)    All other components within normal limits  I-STAT CG4 LACTIC ACID, ED - Abnormal; Notable for the following:    Lactic Acid, Venous 0.32 (*)    All other components within normal limits  LIPASE, BLOOD  ETHANOL  URINALYSIS, ROUTINE W REFLEX MICROSCOPIC  SALICYLATE LEVEL  ACETAMINOPHEN LEVEL  POC URINE PREG, ED    Imaging Review Dg Abd Acute W/chest  05/04/2014   CLINICAL DATA:  Acute onset of lower abdominal pain for 3 days. Unable to have bowel movement. Initial encounter.  EXAM: ACUTE ABDOMEN SERIES (ABDOMEN 2 VIEW & CHEST 1 VIEW)  COMPARISON:  CT of the abdomen and pelvis performed 04/27/2014, and chest radiograph performed 05/26/2012  FINDINGS: The lungs are well-aerated. Pulmonary vascularity is at the upper limits of normal. There is no evidence of focal opacification, pleural effusion or pneumothorax. The cardiomediastinal silhouette is within normal limits.  The visualized bowel gas pattern is unremarkable. Scattered stool and air are seen within  the colon; there is no evidence of small bowel dilatation to suggest obstruction. No free intra-abdominal air is identified on the provided upright view. Clips are noted within the right upper quadrant, reflecting prior cholecystectomy.  No acute osseous abnormalities are seen; the sacroiliac joints are unremarkable in appearance.  IMPRESSION: 1. Unremarkable bowel gas pattern; no free intra-abdominal air  seen. Moderate amount of stool noted within the colon, though the sigmoid colon is largely decompressed. 2. No acute cardiopulmonary process identified.   Electronically Signed   By: Roanna Raider M.D.   On: 05/04/2014 02:11     EKG Interpretation None      12:24 AM Discussed case with attending physician, Dr. Berlinda Last. Agreed to plain film first.   3:01 AM Patient seen and assessed by attending physician, Dr. Berlinda Last. Patient reported that she took all of her medication before coming to the ED - Norco, Amitriptyline, Benadryl - sedatives. Drowsiness and slurred speech secondary to polypharmacy. As per physician, reported that stomach was soft and benign on examination - reported that patient does not want warrant a CT at this time. Recommended hydration and for patient to stay in the ED to be monitored and wake up more.  3:13 AM Transfer of care to Dr. Berlinda Last.   MDM   Final diagnoses:  Constipation, unspecified constipation type  Polypharmacy    Medications  sodium chloride 0.9 % bolus 1,000 mL (1,000 mLs Intravenous New Bag/Given 05/04/14 0238)    Filed Vitals:   05/03/14 2225 05/04/14 0043 05/04/14 0204  BP: 109/66 99/63 115/67  Pulse: 105 82 82  Temp: 98.2 F (36.8 C)    TempSrc: Oral    Resp: Height:  (1.676 m)    Weight: 178 lb 3.2 oz (80.831 kg)    SpO2: 97% 97% 99%   This provider reviewed the patient's chart. Patient was seen and assessed in ED setting on 04/27/2014 regarding abdominal pain. CT abdomen and pelvis with contrast noted small bowel ileus. Patient was admitted to the hospital. Patient was discharged on 04/30/2014. CBC with negative elevated white blood cell count. Hemoglobin 10.6, hematocrit 34.0 - chronic anemia. CMP unremarkable. Lipase negative elevation. Lactic acid negative elevation-0.32. Ethanol negative elevation. Urine drug screen positive for opiates. Plain film of abdomen and chest noted unremarkable bowel gas pattern  - no free intra abdominal air seen. Moderate stool noted in the colin - sigmoid is largely decompressed. No acute cardiopulmonary disease noted.  Plain film noted constipation. Patient presenting to the ED with drowsiness and gargled speech - patient appeared rather sleepy on examination. Most likely due to polypharmacy. Polypharmacy probably leading to constipation and slowing down of the bowels. Patient seen and assessed by attending physician, Dr. Berlinda Last - did not recommend CT at this time. Patient to be monitored in the ED setting for patient to wake up more. Labs pending. Patient to be monitored regarding drowsiness from polypharmacy. Transfer of care to Dr. Berlinda Last at change in shift.   Raymon Mutton, PA-C 05/04/14 0320  Loren Racer, MD 05/04/14 2312

## 2014-05-04 ENCOUNTER — Emergency Department (HOSPITAL_COMMUNITY): Payer: Federal, State, Local not specified - PPO

## 2014-05-04 LAB — RAPID URINE DRUG SCREEN, HOSP PERFORMED
Amphetamines: NOT DETECTED
BARBITURATES: NOT DETECTED
BENZODIAZEPINES: NOT DETECTED
COCAINE: NOT DETECTED
OPIATES: POSITIVE — AB
Tetrahydrocannabinol: NOT DETECTED

## 2014-05-04 LAB — URINALYSIS, ROUTINE W REFLEX MICROSCOPIC
Bilirubin Urine: NEGATIVE
GLUCOSE, UA: NEGATIVE mg/dL
HGB URINE DIPSTICK: NEGATIVE
Ketones, ur: NEGATIVE mg/dL
Nitrite: POSITIVE — AB
PROTEIN: NEGATIVE mg/dL
Specific Gravity, Urine: 1.014 (ref 1.005–1.030)
Urobilinogen, UA: 0.2 mg/dL (ref 0.0–1.0)
pH: 7 (ref 5.0–8.0)

## 2014-05-04 LAB — COMPREHENSIVE METABOLIC PANEL
ALT: 23 U/L (ref 0–35)
AST: 24 U/L (ref 0–37)
Albumin: 4.3 g/dL (ref 3.5–5.2)
Alkaline Phosphatase: 73 U/L (ref 39–117)
Anion gap: 5 (ref 5–15)
BILIRUBIN TOTAL: 0.5 mg/dL (ref 0.3–1.2)
BUN: 10 mg/dL (ref 6–23)
CHLORIDE: 112 mmol/L (ref 96–112)
CO2: 20 mmol/L (ref 19–32)
Calcium: 8.6 mg/dL (ref 8.4–10.5)
Creatinine, Ser: 0.87 mg/dL (ref 0.50–1.10)
GFR calc non Af Amer: 80 mL/min — ABNORMAL LOW (ref >90)
Glucose, Bld: 95 mg/dL (ref 70–99)
POTASSIUM: 3.7 mmol/L (ref 3.5–5.1)
SODIUM: 137 mmol/L (ref 135–145)
Total Protein: 7 g/dL (ref 6.0–8.3)

## 2014-05-04 LAB — ETHANOL: Alcohol, Ethyl (B): <5 mg/dL (ref 0–9)

## 2014-05-04 LAB — URINE MICROSCOPIC-ADD ON

## 2014-05-04 LAB — SALICYLATE LEVEL

## 2014-05-04 LAB — LIPASE, BLOOD: LIPASE: 24 U/L (ref 11–59)

## 2014-05-04 LAB — POC URINE PREG, ED: PREG TEST UR: NEGATIVE

## 2014-05-04 LAB — ACETAMINOPHEN LEVEL: Acetaminophen (Tylenol), Serum: <10 ug/mL — ABNORMAL LOW (ref 10–30)

## 2014-05-04 MED ORDER — CEPHALEXIN 250 MG PO CAPS: 250.0000 mg | ORAL_CAPSULE | Freq: Four times a day (QID) | ORAL | Status: DC

## 2014-05-04 MED ORDER — SODIUM CHLORIDE 0.9 % IV BOLUS (SEPSIS)
1000.0000 mL | Freq: Once | INTRAVENOUS | Status: AC
  Administered 2014-05-04: 1000 mL via INTRAVENOUS

## 2014-05-04 MED ORDER — POLYETHYLENE GLYCOL 3350 17 G PO PACK: 17.0000 g | PACK | Freq: Every day | ORAL | Status: DC

## 2014-05-04 MED ORDER — DICYCLOMINE HCL 20 MG PO TABS: 20.0000 mg | ORAL_TABLET | Freq: Two times a day (BID) | ORAL | Status: DC | PRN

## 2014-05-04 NOTE — ED Notes (Signed)
Patient continues to sleep soundly, chest observed for rise and fall, NAD noted.

## 2014-05-04 NOTE — ED Notes (Signed)
Pt reminded of the need for urine, however appears to be very drowsy at this time.

## 2014-05-04 NOTE — ED Notes (Signed)
Pt ambulated to the restroom in attempt to urinate.

## 2014-05-04 NOTE — ED Notes (Signed)
Patient transported to X-ray 

## 2014-05-04 NOTE — Discharge Instructions (Signed)
Please call your doctor for a followup appointment within 24-48 hours. When you talk to your doctor please let them know that you were seen in the emergency department and have them acquire all of your records so that they can discuss the findings with you and formulate a treatment plan to fully care for your new and ongoing problems. Please call and set-up an appointment with your primary care provider Please call and set-up an appointment with your surgeon  Please rest and stay hydrated Please take Miralax and increase fiber intake for stools to soften  Most of the medications that you are taking result in constipation and slowing down of the bowels Please continue to monitor symptoms closely and if symptoms are to worsen or change (fever greater than 101, chills, sweating, nausea, vomiting, chest pain, shortness of breathe, difficulty breathing, weakness, numbness, tingling, worsening or changes to pain pattern, inability to keep food or fluids down, stomach distension, blood in the stools, black tarry stools) please report back to the Emergency Department immediately.   Constipation Constipation is when a person has fewer than three bowel movements a week, has difficulty having a bowel movement, or has stools that are dry, hard, or larger than normal. As people grow older, constipation is more common. If you try to fix constipation with medicines that make you have a bowel movement (laxatives), the problem may get worse. Long-term laxative use may cause the muscles of the colon to become weak. A low-fiber diet, not taking in enough fluids, and taking certain medicines may make constipation worse.  CAUSES   Certain medicines, such as antidepressants, pain medicine, iron supplements, antacids, and water pills.   Certain diseases, such as diabetes, irritable bowel syndrome (IBS), thyroid disease, or depression.   Not drinking enough water.   Not eating enough fiber-rich foods.   Stress or  travel.   Lack of physical activity or exercise.   Ignoring the urge to have a bowel movement.   Using laxatives too much.  SIGNS AND SYMPTOMS   Having fewer than three bowel movements a week.   Straining to have a bowel movement.   Having stools that are hard, dry, or larger than normal.   Feeling full or bloated.   Pain in the lower abdomen.   Not feeling relief after having a bowel movement.  DIAGNOSIS  Your health care provider will take a medical history and perform a physical exam. Further testing may be done for severe constipation. Some tests may include:  A barium enema X-ray to examine your rectum, colon, and, sometimes, your small intestine.   A sigmoidoscopy to examine your lower colon.   A colonoscopy to examine your entire colon. TREATMENT  Treatment will depend on the severity of your constipation and what is causing it. Some dietary treatments include drinking more fluids and eating more fiber-rich foods. Lifestyle treatments may include regular exercise. If these diet and lifestyle recommendations do not help, your health care provider may recommend taking over-the-counter laxative medicines to help you have bowel movements. Prescription medicines may be prescribed if over-the-counter medicines do not work.  HOME CARE INSTRUCTIONS   Eat foods that have a lot of fiber, such as fruits, vegetables, whole grains, and beans.  Limit foods high in fat and processed sugars, such as french fries, hamburgers, cookies, candies, and soda.   A fiber supplement may be added to your diet if you cannot get enough fiber from foods.   Drink enough fluids to keep your urine  clear or pale yellow.   Exercise regularly or as directed by your health care provider.   Go to the restroom when you have the urge to go. Do not hold it.   Only take over-the-counter or prescription medicines as directed by your health care provider. Do not take other medicines for  constipation without talking to your health care provider first.  SEEK IMMEDIATE MEDICAL CARE IF:   You have bright red blood in your stool.   Your constipation lasts for more than 4 days or gets worse.   You have abdominal or rectal pain.   You have thin, pencil-like stools.   You have unexplained weight loss. MAKE SURE YOU:   Understand these instructions.  Will watch your condition.  Will get help right away if you are not doing well or get worse. Document Released: 11/17/2003 Document Revised: 02/23/2013 Document Reviewed: 11/30/2012 [facility] Patient Information 2015 [place], [place]. This information is not intended to replace advice given to you by your health care provider. Make sure you discuss any questions you have with your health care provider.  High-Fiber Diet Fiber is found in fruits, vegetables, and grains. A high-fiber diet encourages the addition of more whole grains, legumes, fruits, and vegetables in your diet. The recommended amount of fiber for adult males is 38 g per day. For adult females, it is 25 g per day. Pregnant and lactating women should get 28 g of fiber per day. If you have a digestive or bowel problem, ask your caregiver for advice before adding high-fiber foods to your diet. Eat a variety of high-fiber foods instead of only a select few type of foods.  PURPOSE  To increase stool bulk.  To make bowel movements more regular to prevent constipation.  To lower cholesterol.  To prevent overeating. WHEN IS THIS DIET USED?  It may be used if you have constipation and hemorrhoids.  It may be used if you have uncomplicated diverticulosis (intestine condition) and irritable bowel syndrome.  It may be used if you need help with weight management.  It may be used if you want to add it to your diet as a protective measure against atherosclerosis, diabetes, and cancer. SOURCES OF FIBER  Whole-grain breads and cereals.  Fruits, such as apples,  oranges, bananas, berries, prunes, and pears.  Vegetables, such as green peas, carrots, sweet potatoes, beets, broccoli, cabbage, spinach, and artichokes.  Legumes, such split peas, soy, lentils.  Almonds. FIBER CONTENT IN FOODS Starches and Grains / Dietary Fiber (g)  Cheerios, 1 cup / 3 g  Corn Flakes cereal, 1 cup / 0.7 g  Rice crispy treat cereal, 1 cup / 0.3 g  Instant oatmeal (cooked),  cup / 2 g  Frosted wheat cereal, 1 cup / 5.1 g  Brown, long-grain rice (cooked), 1 cup / 3.5 g  White, long-grain rice (cooked), 1 cup / 0.6 g  Enriched macaroni (cooked), 1 cup / 2.5 g Legumes / Dietary Fiber (g)  Baked beans (canned, plain, or vegetarian),  cup / 5.2 g  Kidney beans (canned),  cup / 6.8 g  Pinto beans (cooked),  cup / 5.5 g Breads and Crackers / Dietary Fiber (g)  Plain or honey graham crackers, 2 squares / 0.7 g  Saltine crackers, 3 squares / 0.3 g  Plain, salted pretzels, 10 pieces / 1.8 g  Whole-wheat bread, 1 slice / 1.9 g  White bread, 1 slice / 0.7 g  Raisin bread, 1 slice / 1.2 g  Plain  bagel, 3 oz / 2 g  Flour tortilla, 1 oz / 0.9 g  Corn tortilla, 1 small / 1.5 g  Hamburger or hotdog bun, 1 small / 0.9 g Fruits / Dietary Fiber (g)  Apple with skin, 1 medium / 4.4 g  Sweetened applesauce,  cup / 1.5 g  Banana,  medium / 1.5 g  Grapes, 10 grapes / 0.4 g  Orange, 1 small / 2.3 g  Raisin, 1.5 oz / 1.6 g  Melon, 1 cup / 1.4 g Vegetables / Dietary Fiber (g)  Green beans (canned),  cup / 1.3 g  Carrots (cooked),  cup / 2.3 g  Broccoli (cooked),  cup / 2.8 g  Peas (cooked),  cup / 4.4 g  Mashed potatoes,  cup / 1.6 g  Lettuce, 1 cup / 0.5 g  Corn (canned),  cup / 1.6 g  Tomato,  cup / 1.1 g Document Released: 02/18/2005 Document Revised: 08/20/2011 Document Reviewed: 05/23/2011 [employer] Patient Information 2015 [place], [place]. This information is not intended to replace advice given to you by your health care  provider. Make sure you discuss any questions you have with your health care provider.

## 2014-05-04 NOTE — ED Notes (Signed)
Patient remains drowsy. Patient states she has PTSD and MS and has not been sleeping so her Dr started her on nortriptyline.

## 2014-05-04 NOTE — ED Notes (Addendum)
Patient was reminded we are in need of a urine sample. Patient states she is unable to void at this time. Patient was requested to use call bell to notify staff when she is able to urinate. Patient appears lethargic at this time.

## 2014-05-24 ENCOUNTER — Emergency Department (HOSPITAL_BASED_OUTPATIENT_CLINIC_OR_DEPARTMENT_OTHER): Payer: Federal, State, Local not specified - PPO

## 2014-05-24 ENCOUNTER — Emergency Department (HOSPITAL_BASED_OUTPATIENT_CLINIC_OR_DEPARTMENT_OTHER)
Admission: EM | Admit: 2014-05-24 | Discharge: 2014-05-24 | Disposition: A | Discharge status: Home / Self Care | Payer: Federal, State, Local not specified - PPO | Attending: Emergency Medicine | Admitting: Emergency Medicine

## 2014-05-24 ENCOUNTER — Encounter (HOSPITAL_BASED_OUTPATIENT_CLINIC_OR_DEPARTMENT_OTHER): Payer: Self-pay | Admitting: *Deleted

## 2014-05-24 DIAGNOSIS — R109 Unspecified abdominal pain: Secondary | ICD-10-CM

## 2014-05-24 DIAGNOSIS — R1013 Epigastric pain: Secondary | ICD-10-CM | POA: Diagnosis present

## 2014-05-24 DIAGNOSIS — G43909 Migraine, unspecified, not intractable, without status migrainosus: Secondary | ICD-10-CM | POA: Diagnosis not present

## 2014-05-24 DIAGNOSIS — G40909 Epilepsy, unspecified, not intractable, without status epilepticus: Secondary | ICD-10-CM | POA: Diagnosis not present

## 2014-05-24 DIAGNOSIS — Z792 Long term (current) use of antibiotics: Secondary | ICD-10-CM | POA: Insufficient documentation

## 2014-05-24 DIAGNOSIS — G8929 Other chronic pain: Secondary | ICD-10-CM | POA: Insufficient documentation

## 2014-05-24 DIAGNOSIS — G35 Multiple sclerosis: Secondary | ICD-10-CM | POA: Diagnosis not present

## 2014-05-24 DIAGNOSIS — R630 Anorexia: Secondary | ICD-10-CM | POA: Diagnosis not present

## 2014-05-24 DIAGNOSIS — Z87440 Personal history of urinary (tract) infections: Secondary | ICD-10-CM | POA: Insufficient documentation

## 2014-05-24 DIAGNOSIS — F4310 Post-traumatic stress disorder, unspecified: Secondary | ICD-10-CM | POA: Insufficient documentation

## 2014-05-24 DIAGNOSIS — N3090 Cystitis, unspecified without hematuria: Secondary | ICD-10-CM | POA: Diagnosis not present

## 2014-05-24 DIAGNOSIS — Z79899 Other long term (current) drug therapy: Secondary | ICD-10-CM | POA: Diagnosis not present

## 2014-05-24 DIAGNOSIS — R197 Diarrhea, unspecified: Secondary | ICD-10-CM | POA: Diagnosis not present

## 2014-05-24 DIAGNOSIS — Z862 Personal history of diseases of the blood and blood-forming organs and certain disorders involving the immune mechanism: Secondary | ICD-10-CM | POA: Insufficient documentation

## 2014-05-24 LAB — COMPREHENSIVE METABOLIC PANEL
ALBUMIN: 3.7 g/dL (ref 3.5–5.2)
ALT: 25 U/L (ref 0–35)
AST: 17 U/L (ref 0–37)
Alkaline Phosphatase: 56 U/L (ref 39–117)
Anion gap: 6 (ref 5–15)
BUN: 15 mg/dL (ref 6–23)
CO2: 24 mmol/L (ref 19–32)
CREATININE: 0.86 mg/dL (ref 0.50–1.10)
Calcium: 8.6 mg/dL (ref 8.4–10.5)
Chloride: 109 mmol/L (ref 96–112)
GFR calc Af Amer: >90 mL/min (ref >90)
GFR, EST NON AFRICAN AMERICAN: 82 mL/min — AB (ref >90)
Glucose, Bld: 114 mg/dL — ABNORMAL HIGH (ref 70–99)
Potassium: 3.9 mmol/L (ref 3.5–5.1)
SODIUM: 139 mmol/L (ref 135–145)
Total Bilirubin: 0.3 mg/dL (ref 0.3–1.2)
Total Protein: 6.5 g/dL (ref 6.0–8.3)

## 2014-05-24 LAB — CBC WITH DIFFERENTIAL/PLATELET
BASOS ABS: 0 10*3/uL (ref 0.0–0.1)
BASOS PCT: 0 % (ref 0–1)
Band Neutrophils: 1 % (ref 0–10)
EOS PCT: 0 % (ref 0–5)
Eosinophils Absolute: 0 10*3/uL (ref 0.0–0.7)
HCT: 35.8 % — ABNORMAL LOW (ref 36.0–46.0)
Hemoglobin: 11.1 g/dL — ABNORMAL LOW (ref 12.0–15.0)
Lymphocytes Relative: 25 % (ref 12–46)
Lymphs Abs: 3.4 10*3/uL (ref 0.7–4.0)
MCH: 28.4 pg (ref 26.0–34.0)
MCHC: 31 g/dL (ref 30.0–36.0)
MCV: 91.6 fL (ref 78.0–100.0)
MONOS PCT: 5 % (ref 3–12)
Monocytes Absolute: 0.7 10*3/uL (ref 0.1–1.0)
NEUTROS PCT: 69 % (ref 43–77)
Neutro Abs: 9.3 10*3/uL — ABNORMAL HIGH (ref 1.7–7.7)
PLATELETS: 272 10*3/uL (ref 150–400)
RBC: 3.91 MIL/uL (ref 3.87–5.11)
RDW: 15.6 % — ABNORMAL HIGH (ref 11.5–15.5)
WBC: 13.4 10*3/uL — AB (ref 4.0–10.5)

## 2014-05-24 LAB — URINALYSIS, ROUTINE W REFLEX MICROSCOPIC
Bilirubin Urine: NEGATIVE
Glucose, UA: NEGATIVE mg/dL
HGB URINE DIPSTICK: NEGATIVE
Ketones, ur: NEGATIVE mg/dL
LEUKOCYTES UA: NEGATIVE
Nitrite: POSITIVE — AB
PH: 7.5 (ref 5.0–8.0)
Protein, ur: NEGATIVE mg/dL
Specific Gravity, Urine: 1.013 (ref 1.005–1.030)
Urobilinogen, UA: 0.2 mg/dL (ref 0.0–1.0)

## 2014-05-24 LAB — URINE MICROSCOPIC-ADD ON

## 2014-05-24 LAB — LIPASE, BLOOD: LIPASE: 29 U/L (ref 11–59)

## 2014-05-24 MED ORDER — SODIUM CHLORIDE 0.9 % IV BOLUS (SEPSIS)
1000.0000 mL | Freq: Once | INTRAVENOUS | Status: AC
  Administered 2014-05-24: 1000 mL via INTRAVENOUS

## 2014-05-24 MED ORDER — CLARITHROMYCIN 500 MG PO TABS: 500.0000 mg | ORAL_TABLET | Freq: Two times a day (BID) | ORAL | Status: DC

## 2014-05-24 MED ORDER — IOHEXOL 300 MG/ML  SOLN
25.0000 mL | Freq: Once | INTRAMUSCULAR | Status: AC | PRN
  Administered 2014-05-24: 25 mL via ORAL

## 2014-05-24 MED ORDER — CEFPODOXIME PROXETIL 100 MG PO TABS: 100.0000 mg | ORAL_TABLET | Freq: Two times a day (BID) | ORAL | Status: DC

## 2014-05-24 MED ORDER — IOHEXOL 300 MG/ML  SOLN
100.0000 mL | Freq: Once | INTRAMUSCULAR | Status: AC | PRN
  Administered 2014-05-24: 100 mL via INTRAVENOUS

## 2014-05-24 MED ORDER — OMEPRAZOLE 20 MG PO CPDR: 20.0000 mg | DELAYED_RELEASE_CAPSULE | Freq: Every day | ORAL | Status: DC

## 2014-05-24 MED ORDER — AMOXICILLIN 500 MG PO CAPS: 1000.0000 mg | ORAL_CAPSULE | Freq: Two times a day (BID) | ORAL | Status: DC

## 2014-05-24 NOTE — ED Provider Notes (Signed)
CSN: [id number]     Arrival date & time 05/24/14  1539 History   First MD Initiated Contact with Patient 05/24/14 1553     Chief Complaint  Patient presents with  . Abdominal Pain     (Consider location/radiation/quality/duration/timing/severity/associated sxs/prior Treatment) Patient is a 44 y.o. female presenting with abdominal pain.  Abdominal Pain Pain location:  Epigastric Pain quality: bloating   Pain radiates to:  Does not radiate Pain severity:  Moderate Onset quality:  Gradual Duration:  3 days Timing:  Constant Progression:  Worsening Chronicity:  Recurrent Context comment:  Multiple prior abd surgeries with adhesions Relieved by:  Nothing Worsened by:  Movement and eating Associated symptoms: anorexia and diarrhea   Associated symptoms: no chest pain, no constipation, no cough, no dysuria and no fever     Past Medical History  Diagnosis Date  . Migraine   . Trigeminal neuralgia   . Molar pregnancy   . Seizures   . PTSD (post-traumatic stress disorder)   . Pseudotumor cerebri   . MS (multiple sclerosis)   . Mitral valve prolapse   . Iron deficiency anemia 10/18/2013   Past Surgical History  Procedure Laterality Date  . Appendectomy    . Breast surgery    . Cholecystectomy    . Gastric bypass    . Tonsillectomy    . Carpal tunnel release    . Vaginal hysterectomy    . Bilateral salpingoophorectomy     Family History  Problem Relation Age of Onset  . Cancer Maternal Aunt     Colon Cancer   . Alcohol abuse Maternal Uncle   . Cancer Paternal Grandfather     Colon Cancer   History  Substance Use Topics  . Smoking status: Never Smoker   . Smokeless tobacco: Never Used  . Alcohol Use: No   OB History    No data available     Review of Systems  Constitutional: Negative for fever.  Respiratory: Negative for cough.   Cardiovascular: Negative for chest pain.  Gastrointestinal: Positive for abdominal pain, diarrhea and anorexia. Negative for  constipation.  Genitourinary: Negative for dysuria.  All other systems reviewed and are negative.     Allergies  Demerol; Morphine and related; and Other  Home Medications   Prior to Admission medications   Medication Sig Start Date End Date Taking? Authorizing Provider  acetaZOLAMIDE (DIAMOX) 250 MG tablet Take 500 mg by mouth 2 (two) times daily.     Historical Provider, MD  acyclovir (ZOVIRAX) 800 MG tablet Take 800 mg by mouth 2 (two) times daily.  01/29/13   Historical Provider, MD  amoxicillin (AMOXIL) 500 MG capsule Take 2 capsules (1,000 mg total) by mouth 2 (two) times daily. 05/24/14   Mirian Mo, MD  BIOTIN PO Take 1 tablet by mouth daily.    Historical Provider, MD  Calcium Citrate-Vitamin D 500-500 MG-UNIT CHEW Chew 2 tablets by mouth 2 (two) times daily.    Historical Provider, MD  carisoprodol (SOMA) 350 MG tablet Take 350 mg by mouth 3 (three) times daily as needed for muscle spasms.  12/28/12   Historical Provider, MD  cefpodoxime (VANTIN) 100 MG tablet Take 1 tablet (100 mg total) by mouth 2 (two) times daily. 05/24/14   Mirian Mo, MD  cephALEXin (KEFLEX) 250 MG capsule Take 1 capsule (250 mg total) by mouth 4 (four) times daily. 05/04/14   Loren Racer, MD  cetirizine (ZYRTEC) 10 MG tablet Take 10 mg by mouth daily.  Historical Provider, MD  clarithromycin (BIAXIN) 500 MG tablet Take 1 tablet (500 mg total) by mouth 2 (two) times daily. 05/24/14   Mirian Mo, MD  clonazePAM (KLONOPIN) 1 MG tablet Take 0.5 mg by mouth 3 (three) times daily as needed for anxiety.     Historical Provider, MD  dicyclomine (BENTYL) 20 MG tablet Take 1 tablet (20 mg total) by mouth 2 (two) times daily as needed for spasms. 05/04/14   Loren Racer, MD  Dimethyl Fumarate (TECFIDERA) 240 MG CPDR Take 240 mg by mouth 2 (two) times daily.     Historical Provider, MD  diphenhydrAMINE (BENADRYL) 25 mg capsule Take 25 mg by mouth 2 (two) times daily. With Tecfidera.    Historical  Provider, MD  estradiol (ESTRACE) 1 MG tablet Take 1 mg by mouth daily.    Historical Provider, MD  gabapentin (NEURONTIN) 600 MG tablet Take 1,200 mg by mouth at bedtime.    Historical Provider, MD  HYDROcodone-acetaminophen (NORCO/VICODIN) 5-325 MG per tablet Take 1 tablet by mouth every 6 (six) hours as needed for moderate pain.    Historical Provider, MD  HYDROcodone-acetaminophen (VICODIN) 5-500 MG per tablet Take 1 tablet by mouth every 6 (six) hours as needed for pain. Patient not taking: Reported on 05/03/2014 04/30/14   Maryruth Bun Rama, MD  LamoTRIgine XR 200 MG TB24 Take 200 mg by mouth 2 (two) times daily.    Historical Provider, MD  LevETIRAcetam (KEPPRA PO) Take 500 mg by mouth 2 (two) times daily.     Historical Provider, MD  meclizine (ANTIVERT) 25 MG tablet Take 25 mg by mouth 3 (three) times daily as needed for dizziness.  01/25/13   Historical Provider, MD  metoCLOPramide (REGLAN) 5 MG tablet Take 1 tablet (5 mg total) by mouth 4 (four) times daily. 04/30/14   Maryruth Bun Rama, MD  Multiple Vitamin (MULTIVITAMIN WITH MINERALS) TABS tablet Take 1 tablet by mouth daily.    Historical Provider, MD  NON FORMULARY Estradiol cream as directed (daily), compounded from [facility]. Dose unknown    Historical Provider, MD  nortriptyline (PAMELOR) 50 MG capsule Take 100 mg by mouth at bedtime.    Historical Provider, MD  omeprazole (PRILOSEC) 20 MG capsule Take 1 capsule (20 mg total) by mouth daily. 05/24/14   Mirian Mo, MD  polyethylene glycol [facility] Ethelene Hal) packet Take 17 g by mouth daily. 05/04/14   Marissa Sciacca, PA-C  promethazine (PHENERGAN) 25 MG tablet Take 25 mg by mouth every 6 (six) hours as needed for nausea.     Historical Provider, MD  pseudoephedrine (SUDAFED) 30 MG tablet Take 60 mg by mouth at bedtime as needed for congestion.    Historical Provider, MD  SUMAtriptan (IMITREX) 100 MG tablet Take 100 mg by mouth every 2 (two) hours as needed for migraine or  headache.     Historical Provider, MD  traMADol (ULTRAM) 50 MG tablet Take 1 tablet by mouth every 6 (six) hours as needed. pain 01/26/14   Historical Provider, MD   BP 118/70 mmHg  Pulse 80  Temp(Src) 98.6 F (37 C) (Oral)  Resp 14  Ht  (1.676 m)  Wt 181 lb (82.101 kg)  BMI 29.23 kg/m2  SpO2 100% Physical Exam  Constitutional: She is oriented to person, place, and time. She appears well-developed and well-nourished.  HENT:  Head: Normocephalic and atraumatic.  Right Ear: External ear normal.  Left Ear: External ear normal.  Eyes: Conjunctivae and EOM are normal. Pupils  are equal, round, and reactive to light.  Neck: Normal range of motion. Neck supple.  Cardiovascular: Normal rate, regular rhythm, normal heart sounds and intact distal pulses.   Pulmonary/Chest: Effort normal and breath sounds normal.  Abdominal: Soft. Bowel sounds are normal. There is tenderness in the right upper quadrant, epigastric area and left upper quadrant.  Musculoskeletal: Normal range of motion.  Neurological: She is alert and oriented to person, place, and time.  Skin: Skin is warm and dry.  Vitals reviewed.   ED Course  Procedures (including critical care time) Labs Review Labs Reviewed  URINALYSIS, ROUTINE W REFLEX MICROSCOPIC - Abnormal; Notable for the following:    Nitrite POSITIVE (*)    All other components within normal limits  URINE MICROSCOPIC-ADD ON - Abnormal; Notable for the following:    Bacteria, UA MANY (*)    All other components within normal limits  CBC WITH DIFFERENTIAL/PLATELET - Abnormal; Notable for the following:    WBC 13.4 (*)    Hemoglobin 11.1 (*)    HCT 35.8 (*)    RDW 15.6 (*)    Neutro Abs 9.3 (*)    All other components within normal limits  COMPREHENSIVE METABOLIC PANEL - Abnormal; Notable for the following:    Glucose, Bld 114 (*)    GFR calc non Af Amer 82 (*)    All other components within normal limits  LIPASE, BLOOD  CBC WITH  DIFFERENTIAL/PLATELET    Imaging Review Ct Abdomen Pelvis W Contrast  05/24/2014   CLINICAL DATA:  Chronic mid upper abdominal pain, with nausea and diarrhea. Initial encounter.  EXAM: CT ABDOMEN AND PELVIS WITH CONTRAST  TECHNIQUE: Multidetector CT imaging of the abdomen and pelvis was performed using the standard protocol following bolus administration of intravenous contrast.  CONTRAST:  25mL OMNIPAQUE IOHEXOL 300 MG/ML SOLN, OMNIPAQUE IOHEXOL 300 MG/ML SOLN  COMPARISON:  CT of the abdomen and pelvis performed 04/27/2014  FINDINGS: The visualized lung bases are clear.  A 0.9 cm cyst is noted at the right hepatic lobe. A small calcified granuloma is seen within the spleen. The liver and spleen are otherwise unremarkable. The patient is status post cholecystectomy, with clips noted along the gallbladder fossa. The pancreas and adrenal glands are unremarkable.  The kidneys are unremarkable in appearance. There is no evidence of hydronephrosis. No renal or ureteral stones are seen. No perinephric stranding is appreciated.  No free fluid is identified. The small bowel is unremarkable in appearance. The patient is status post gastric bypass surgery. The gastrojejunal anastomosis is unremarkable in appearance. The distal stomach and duodenum contain a small amount of fluid, but are otherwise unremarkable. No acute vascular abnormalities are seen.  The patient is status post appendectomy. The colon is partially filled with fluid and stool, and is unremarkable in appearance.  The bladder is moderately distended and grossly unremarkable. The patient is status post hysterectomy. No suspicious adnexal masses are seen. No inguinal lymphadenopathy is seen.  No acute osseous abnormalities are identified.  IMPRESSION: 1. No acute abnormality seen to explain the patient's symptoms. 2. Tiny hepatic cyst noted, stable from 2011. 3. Gastrojejunal anastomosis is unremarkable in appearance.   Electronically Signed   By:  Roanna Raider M.D.   On: 05/24/2014 17:47     EKG Interpretation None      MDM   Final diagnoses:  Chronic abdominal pain  Cystitis    44 y.o. female with pertinent PMH of chronic abd pain presents with acute on chronic  pain.   No new symptoms. Symptoms have been constant for quite some time. Patient states she just finished anabolic course for urinary tract infection. On arrival today the patient's vital signs and physical exam as above. She has abdominal tenderness more prominent in the epigastrium. Given patient's prior history of small bowel obstruction which was fairly subtle repeated CT scan of the abdomen today after discussion of radiation risks. This returned as above unremarkable. Patient had improvement of symptoms after normal saline and Zofran. UA with positive nitrites negative leukoesterase.  Patient does endorse continued urinary symptoms so will treat with Vantin.  She has follow-up with GI. Stable to discharge home with standard return precautions. Also informed patient that peptic ulcer disease is a possible etiology, informed her to take the antibiotic Vantin for 1 week, take one week rest, then start triple therapy so that she does not develop antibiotic related diarrhea.  I have reviewed all laboratory and imaging studies if ordered as above  1. Chronic abdominal pain   2. Cystitis         Mirian Mo, MD 05/24/14 [phone]

## 2014-05-24 NOTE — Discharge Instructions (Signed)
Abdominal Pain, Women °Abdominal (stomach, pelvic, or belly) pain can be caused by many things. It is important to tell your doctor: °· The location of the pain. °· Does it come and go or is it present all the time? °· Are there things that start the pain (eating certain foods, exercise)? °· Are there other symptoms associated with the pain (fever, nausea, vomiting, diarrhea)? °All of this is helpful to know when trying to find the cause of the pain. °CAUSES  °· Stomach: virus or bacteria infection, or ulcer. °· Intestine: appendicitis (inflamed appendix), regional ileitis (Crohn's disease), ulcerative colitis (inflamed colon), irritable bowel syndrome, diverticulitis (inflamed diverticulum of the colon), or cancer of the stomach or intestine. °· Gallbladder disease or stones in the gallbladder. °· Kidney disease, kidney stones, or infection. °· Pancreas infection or cancer. °· Fibromyalgia (pain disorder). °· Diseases of the female organs: °¨ Uterus: fibroid (non-cancerous) tumors or infection. °¨ Fallopian tubes: infection or tubal pregnancy. °¨ Ovary: cysts or tumors. °¨ Pelvic adhesions (scar tissue). °¨ Endometriosis (uterus lining tissue growing in the pelvis and on the pelvic organs). °¨ Pelvic congestion syndrome (female organs filling up with blood just before the menstrual period). °¨ Pain with the menstrual period. °¨ Pain with ovulation (producing an egg). °¨ Pain with an IUD (intrauterine device, birth control) in the uterus. °¨ Cancer of the female organs. °· Functional pain (pain not caused by a disease, may improve without treatment). °· Psychological pain. °· Depression. °DIAGNOSIS  °Your doctor will decide the seriousness of your pain by doing an examination. °· Blood tests. °· X-rays. °· Ultrasound. °· CT scan (computed tomography, special type of X-ray). °· MRI (magnetic resonance imaging). °· Cultures, for infection. °· Barium enema (dye inserted in the large intestine, to better view it with  X-rays). °· Colonoscopy (looking in intestine with a lighted tube). °· Laparoscopy (minor surgery, looking in abdomen with a lighted tube). °· Major abdominal exploratory surgery (looking in abdomen with a large incision). °TREATMENT  °The treatment will depend on the cause of the pain.  °· Many cases can be observed and treated at home. °· Over-the-counter medicines recommended by your caregiver. °· Prescription medicine. °· Antibiotics, for infection. °· Birth control pills, for painful periods or for ovulation pain. °· Hormone treatment, for endometriosis. °· Nerve blocking injections. °· Physical therapy. °· Antidepressants. °· Counseling with a psychologist or psychiatrist. °· Minor or major surgery. °HOME CARE INSTRUCTIONS  °· Do not take laxatives, unless directed by your caregiver. °· Take over-the-counter pain medicine only if ordered by your caregiver. Do not take aspirin because it can cause an upset stomach or bleeding. °· Try a clear liquid diet (broth or water) as ordered by your caregiver. Slowly move to a bland diet, as tolerated, if the pain is related to the stomach or intestine. °· Have a thermometer and take your temperature several times a day, and record it. °· Bed rest and sleep, if it helps the pain. °· Avoid sexual intercourse, if it causes pain. °· Avoid stressful situations. °· Keep your follow-up appointments and tests, as your caregiver orders. °· If the pain does not go away with medicine or surgery, you may try: °¨ Acupuncture. °¨ Relaxation exercises (yoga, meditation). °¨ Group therapy. °¨ Counseling. °SEEK MEDICAL CARE IF:  °· You notice certain foods cause stomach pain. °· Your home care treatment is not helping your pain. °· You need stronger pain medicine. °· You want your IUD removed. °· You feel faint or   lightheaded. °· You develop nausea and vomiting. °· You develop a rash. °· You are having side effects or an allergy to your medicine. °SEEK IMMEDIATE MEDICAL CARE IF:  °· Your  pain does not go away or gets worse. °· You have a fever. °· Your pain is felt only in portions of the abdomen. The right side could possibly be appendicitis. The left lower portion of the abdomen could be colitis or diverticulitis. °· You are passing blood in your stools (bright red or black tarry stools, with or without vomiting). °· You have blood in your urine. °· You develop chills, with or without a fever. °· You pass out. °MAKE SURE YOU:  °· Understand these instructions. °· Will watch your condition. °· Will get help right away if you are not doing well or get worse. °Document Released: 12/16/2006 Document Revised: 07/05/2013 Document Reviewed: 01/05/2009 °[employer]® Patient Information ©2015 [employer]. This information is not intended to replace advice given to you by your health care provider. Make sure you discuss any questions you have with your health care provider. ° °

## 2014-05-24 NOTE — ED Notes (Signed)
Patient transported to CT 

## 2014-05-24 NOTE — ED Notes (Signed)
Pt unable to provide urine sample at this time 

## 2014-05-24 NOTE — ED Notes (Signed)
Notified that lab draw was hemolyzed T Masten RN in for repeat lab draw

## 2014-05-24 NOTE — ED Notes (Signed)
Pt c/o central abdominal pain that began last month. Pt has been seen multiple times for same complaint without resolution.

## 2014-06-13 ENCOUNTER — Encounter (HOSPITAL_BASED_OUTPATIENT_CLINIC_OR_DEPARTMENT_OTHER): Payer: Self-pay | Admitting: *Deleted

## 2014-06-13 ENCOUNTER — Emergency Department (HOSPITAL_BASED_OUTPATIENT_CLINIC_OR_DEPARTMENT_OTHER): Payer: Federal, State, Local not specified - PPO

## 2014-06-13 ENCOUNTER — Emergency Department (HOSPITAL_BASED_OUTPATIENT_CLINIC_OR_DEPARTMENT_OTHER)
Admission: EM | Admit: 2014-06-13 | Discharge: 2014-06-13 | Disposition: A | Discharge status: Home / Self Care | Payer: Federal, State, Local not specified - PPO | Attending: Emergency Medicine | Admitting: Emergency Medicine

## 2014-06-13 DIAGNOSIS — Z793 Long term (current) use of hormonal contraceptives: Secondary | ICD-10-CM | POA: Diagnosis not present

## 2014-06-13 DIAGNOSIS — Z8659 Personal history of other mental and behavioral disorders: Secondary | ICD-10-CM | POA: Diagnosis not present

## 2014-06-13 DIAGNOSIS — Z792 Long term (current) use of antibiotics: Secondary | ICD-10-CM | POA: Diagnosis not present

## 2014-06-13 DIAGNOSIS — G35 Multiple sclerosis: Secondary | ICD-10-CM | POA: Insufficient documentation

## 2014-06-13 DIAGNOSIS — M542 Cervicalgia: Secondary | ICD-10-CM | POA: Diagnosis not present

## 2014-06-13 DIAGNOSIS — F4310 Post-traumatic stress disorder, unspecified: Secondary | ICD-10-CM | POA: Insufficient documentation

## 2014-06-13 DIAGNOSIS — Z862 Personal history of diseases of the blood and blood-forming organs and certain disorders involving the immune mechanism: Secondary | ICD-10-CM | POA: Insufficient documentation

## 2014-06-13 DIAGNOSIS — Z79899 Other long term (current) drug therapy: Secondary | ICD-10-CM | POA: Diagnosis not present

## 2014-06-13 DIAGNOSIS — G40909 Epilepsy, unspecified, not intractable, without status epilepticus: Secondary | ICD-10-CM | POA: Insufficient documentation

## 2014-06-13 DIAGNOSIS — R42 Dizziness and giddiness: Secondary | ICD-10-CM

## 2014-06-13 DIAGNOSIS — G43009 Migraine without aura, not intractable, without status migrainosus: Secondary | ICD-10-CM | POA: Insufficient documentation

## 2014-06-13 DIAGNOSIS — R51 Headache: Secondary | ICD-10-CM | POA: Diagnosis present

## 2014-06-13 LAB — CBC
HCT: 34.7 % — ABNORMAL LOW (ref 36.0–46.0)
Hemoglobin: 10.5 g/dL — ABNORMAL LOW (ref 12.0–15.0)
MCH: 28 pg (ref 26.0–34.0)
MCHC: 30.3 g/dL (ref 30.0–36.0)
MCV: 92.5 fL (ref 78.0–100.0)
Platelets: 307 10*3/uL (ref 150–400)
RBC: 3.75 MIL/uL — ABNORMAL LOW (ref 3.87–5.11)
RDW: 15.7 % — ABNORMAL HIGH (ref 11.5–15.5)
WBC: 7.5 10*3/uL (ref 4.0–10.5)

## 2014-06-13 LAB — BASIC METABOLIC PANEL
Anion gap: 5 (ref 5–15)
BUN: 13 mg/dL (ref 6–23)
CALCIUM: 8.7 mg/dL (ref 8.4–10.5)
CO2: 22 mmol/L (ref 19–32)
Chloride: 114 mmol/L — ABNORMAL HIGH (ref 96–112)
Creatinine, Ser: 0.86 mg/dL (ref 0.50–1.10)
GFR, EST NON AFRICAN AMERICAN: 82 mL/min — AB (ref >90)
Glucose, Bld: 102 mg/dL — ABNORMAL HIGH (ref 70–99)
POTASSIUM: 3.8 mmol/L (ref 3.5–5.1)
Sodium: 141 mmol/L (ref 135–145)

## 2014-06-13 MED ORDER — DIPHENHYDRAMINE HCL 50 MG/ML IJ SOLN
25.0000 mg | Freq: Once | INTRAMUSCULAR | Status: DC
  Filled 2014-06-13: qty 1

## 2014-06-13 MED ORDER — METOCLOPRAMIDE HCL 5 MG/ML IJ SOLN
10.0000 mg | Freq: Once | INTRAMUSCULAR | Status: AC
  Administered 2014-06-13: 10 mg via INTRAVENOUS
  Filled 2014-06-13: qty 2

## 2014-06-13 MED ORDER — DEXAMETHASONE SODIUM PHOSPHATE 10 MG/ML IJ SOLN
10.0000 mg | Freq: Once | INTRAMUSCULAR | Status: AC
  Administered 2014-06-13: 10 mg via INTRAVENOUS
  Filled 2014-06-13: qty 1

## 2014-06-13 NOTE — ED Provider Notes (Signed)
CSN: [id number]     Arrival date & time 06/13/14  1036 History   None    Chief Complaint  Patient presents with  . Headache     (Consider location/radiation/quality/duration/timing/severity/associated sxs/prior Treatment) HPI Pt is a 44yo female with hx of migraines, trigeminal neuroalgia, seizures, PTSD, pseudotumor cerebri, MS, and iron deficiency anemia, presenting to ED c/o gradually worsening Right sided posterior headache, associated nausea and intermittent episodes of near syncope for 1 week.  Pt reports having 3 near syncope episodes this week.  Headache is aching and throbbing, worse in Right posterior side of head, 9/10 at worst, mild nausea but no vomiting. Denies vision changes. Pt does report hitting her head earlier this week during one of the syncope episodes. Reports calling her neurologist at [facility] in [place] who typically does LPs for her pseudotumor cerebri but advised pt come to ED due to syncopal episodes. Pt does have hx of anemia.   Past Medical History  Diagnosis Date  . Migraine   . Trigeminal neuralgia   . Molar pregnancy   . Seizures   . PTSD (post-traumatic stress disorder)   . Pseudotumor cerebri   . MS (multiple sclerosis)   . Mitral valve prolapse   . Iron deficiency anemia 10/18/2013   Past Surgical History  Procedure Laterality Date  . Appendectomy    . Breast surgery    . Cholecystectomy    . Gastric bypass    . Tonsillectomy    . Carpal tunnel release    . Vaginal hysterectomy    . Bilateral salpingoophorectomy     Family History  Problem Relation Age of Onset  . Cancer Maternal Aunt     Colon Cancer   . Alcohol abuse Maternal Uncle   . Cancer Paternal Grandfather     Colon Cancer   History  Substance Use Topics  . Smoking status: Never Smoker   . Smokeless tobacco: Never Used  . Alcohol Use: No   OB History    No data available     Review of Systems  Constitutional: Negative for fever, chills, diaphoresis, appetite  change and fatigue.  HENT: Negative for congestion, ear pain and sore throat.   Eyes: Positive for photophobia. Negative for pain and visual disturbance.  Respiratory: Negative for shortness of breath.   Cardiovascular: Negative for chest pain and palpitations.  Gastrointestinal: Positive for nausea. Negative for vomiting, abdominal pain, diarrhea and constipation.  Musculoskeletal: Positive for myalgias ( Right side of neck) and neck pain. Negative for back pain, arthralgias and neck stiffness.  Neurological: Positive for syncope, light-headedness and headaches. Negative for dizziness, tremors, seizures, facial asymmetry, speech difficulty, weakness and numbness.  All other systems reviewed and are negative.     Allergies  Demerol; Morphine and related; and Other  Home Medications   Prior to Admission medications   Medication Sig Start Date End Date Taking? Authorizing Provider  acetaZOLAMIDE (DIAMOX) 250 MG tablet Take 500 mg by mouth 2 (two) times daily.     Historical Provider, MD  acyclovir (ZOVIRAX) 800 MG tablet Take 800 mg by mouth 2 (two) times daily.  01/29/13   Historical Provider, MD  amoxicillin (AMOXIL) 500 MG capsule Take 2 capsules (1,000 mg total) by mouth 2 (two) times daily. 05/24/14   Mirian Mo, MD  BIOTIN PO Take 1 tablet by mouth daily.    Historical Provider, MD  Calcium Citrate-Vitamin D 500-500 MG-UNIT CHEW Chew 2 tablets by mouth 2 (two) times daily.  Historical Provider, MD  carisoprodol (SOMA) 350 MG tablet Take 350 mg by mouth 3 (three) times daily as needed for muscle spasms.  12/28/12   Historical Provider, MD  cefpodoxime (VANTIN) 100 MG tablet Take 1 tablet (100 mg total) by mouth 2 (two) times daily. 05/24/14   Mirian Mo, MD  cephALEXin (KEFLEX) 250 MG capsule Take 1 capsule (250 mg total) by mouth 4 (four) times daily. 05/04/14   Loren Racer, MD  cetirizine (ZYRTEC) 10 MG tablet Take 10 mg by mouth daily.      Historical Provider, MD   clarithromycin (BIAXIN) 500 MG tablet Take 1 tablet (500 mg total) by mouth 2 (two) times daily. 05/24/14   Mirian Mo, MD  clonazePAM (KLONOPIN) 1 MG tablet Take 0.5 mg by mouth 3 (three) times daily as needed for anxiety.     Historical Provider, MD  dicyclomine (BENTYL) 20 MG tablet Take 1 tablet (20 mg total) by mouth 2 (two) times daily as needed for spasms. 05/04/14   Loren Racer, MD  Dimethyl Fumarate (TECFIDERA) 240 MG CPDR Take 240 mg by mouth 2 (two) times daily.     Historical Provider, MD  diphenhydrAMINE (BENADRYL) 25 mg capsule Take 25 mg by mouth 2 (two) times daily. With Tecfidera.    Historical Provider, MD  estradiol (ESTRACE) 1 MG tablet Take 1 mg by mouth daily.    Historical Provider, MD  gabapentin (NEURONTIN) 600 MG tablet Take 1,200 mg by mouth at bedtime.    Historical Provider, MD  HYDROcodone-acetaminophen (NORCO/VICODIN) 5-325 MG per tablet Take 1 tablet by mouth every 6 (six) hours as needed for moderate pain.    Historical Provider, MD  HYDROcodone-acetaminophen (VICODIN) 5-500 MG per tablet Take 1 tablet by mouth every 6 (six) hours as needed for pain. Patient not taking: Reported on 05/03/2014 04/30/14   Maryruth Bun Rama, MD  LamoTRIgine XR 200 MG TB24 Take 200 mg by mouth 2 (two) times daily.    Historical Provider, MD  LevETIRAcetam (KEPPRA PO) Take 500 mg by mouth 2 (two) times daily.     Historical Provider, MD  meclizine (ANTIVERT) 25 MG tablet Take 25 mg by mouth 3 (three) times daily as needed for dizziness.  01/25/13   Historical Provider, MD  metoCLOPramide (REGLAN) 5 MG tablet Take 1 tablet (5 mg total) by mouth 4 (four) times daily. 04/30/14   Maryruth Bun Rama, MD  Multiple Vitamin (MULTIVITAMIN WITH MINERALS) TABS tablet Take 1 tablet by mouth daily.    Historical Provider, MD  NON FORMULARY Estradiol cream as directed (daily), compounded from [facility]. Dose unknown    Historical Provider, MD  nortriptyline (PAMELOR) 50 MG capsule Take 100 mg by  mouth at bedtime.    Historical Provider, MD  omeprazole (PRILOSEC) 20 MG capsule Take 1 capsule (20 mg total) by mouth daily. 05/24/14   Mirian Mo, MD  polyethylene glycol [facility] / Ethelene Hal) packet Take 17 g by mouth daily. 05/04/14   Marissa Sciacca, PA-C  promethazine (PHENERGAN) 25 MG tablet Take 25 mg by mouth every 6 (six) hours as needed for nausea.     Historical Provider, MD  pseudoephedrine (SUDAFED) 30 MG tablet Take 60 mg by mouth at bedtime as needed for congestion.    Historical Provider, MD  SUMAtriptan (IMITREX) 100 MG tablet Take 100 mg by mouth every 2 (two) hours as needed for migraine or headache.     Historical Provider, MD  traMADol (ULTRAM) 50 MG tablet Take 1 tablet by  mouth every 6 (six) hours as needed. pain 01/26/14   Historical Provider, MD   BP 110/76 mmHg  Pulse 89  Temp(Src) 97.9 F (36.6 C) (Oral)  Resp 20  Ht 5\' 6"  (1.676 m)  Wt 179 lb (81.194 kg)  BMI 28.91 kg/m2  SpO2 100% Physical Exam  Constitutional: She is oriented to person, place, and time. She appears well-developed and well-nourished. No distress.  HENT:  Head: Normocephalic and atraumatic.  Right Ear: Hearing, tympanic membrane, external ear and ear canal normal.  Left Ear: Hearing, tympanic membrane, external ear and ear canal normal.  Nose: Nose normal.  Mouth/Throat: Uvula is midline, oropharynx is clear and moist and mucous membranes are normal.  Eyes: Conjunctivae and EOM are normal. Pupils are equal, round, and reactive to light. No scleral icterus.  Neck: Normal range of motion. Neck supple.  No midline spinal tenderness. Tenderness to Right side cervical muscles. FROM w/o pain  Cardiovascular: Normal rate, regular rhythm and normal heart sounds.   Pulmonary/Chest: Effort normal and breath sounds normal. No respiratory distress. She has no wheezes. She has no rales. She exhibits no tenderness.  Abdominal: Soft. Bowel sounds are normal. She exhibits no distension and no mass. There  is no tenderness. There is no rebound and no guarding.  Musculoskeletal: Normal range of motion. She exhibits no edema or tenderness.  Neurological: She is alert and oriented to person, place, and time. No cranial nerve deficit.  Alert to person place and time. CN II-XII in tact. Pt drinking water during exam, able to swallow w/o difficulty.  FROM upper and lower extremities. 5/5 strength bilaterally.   Skin: Skin is warm and dry. No rash noted. She is not diaphoretic. No erythema.  Nursing note and vitals reviewed.   ED Course  Procedures (including critical care time) Labs Review Labs Reviewed  BASIC METABOLIC PANEL - Abnormal; Notable for the following:    Chloride 114 (*)    Glucose, Bld 102 (*)    GFR calc non Af Amer 82 (*)    All other components within normal limits  CBC - Abnormal; Notable for the following:    RBC 3.75 (*)    Hemoglobin 10.5 (*)    HCT 34.7 (*)    RDW 15.7 (*)    All other components within normal limits    Imaging Review Ct Head Wo Contrast  06/13/2014   CLINICAL DATA:  Headache for 1 week, syncope 3 time last week  EXAM: CT HEAD WITHOUT CONTRAST  TECHNIQUE: Contiguous axial images were obtained from the base of the skull through the vertex without intravenous contrast.  COMPARISON:  08/08/2013  FINDINGS: No skull fracture. Paranasal sinuses and mastoid air cells are unremarkable. No intracranial hemorrhage, mass effect or midline shift. No hydrocephalus. The gray and white-matter differentiation is preserved. No acute infarction. No mass lesion is noted on this unenhanced scan.  IMPRESSION: No acute intracranial abnormality.  No significant change.   Electronically Signed   By: Natasha Mead M.D.   On: 06/13/2014 12:16     EKG Interpretation None      MDM   Final diagnoses:  Migraine without aura and without status migrainosus, not intractable  Dizziness    Pt is a 44yo female with hx of MS, pseudotumor cerebri, and anemia, presenting to ED with c/o  gradually worsening headache with episodes of syncope and near syncope. Pt appears well, NAD, afebrile. Doubt meningitis. No focal neuro deficit. Doubt CVA. HA gradual in onset, similar to  previous, doubt SAH.  Pt's neurologist advised her to come to ED for further evaluation of syncopal episodes.   Last CT head performed over 1 year ago, will get CT to ensure no brain mass or bleed as pt does report hitting her head.    Labs: c/w previous, including Hgb/Hct. CT head: no acute intracranial abnormality. No significant change.   1:40 PM Pt states she is feeling a lot better and feels comfortable being discharged home. States she feels comfortable f/u with her neurologist as "he is the only one I let do my LPs because they do not hurt" pt stable for discharge home, no evidence of emergent process taking place at this time. Recommend f/u with her neurologist. Return precautions provided. Pt verbalized understanding and agreement with tx plan.   Discussed pt with  Dr. Judd Lien who agrees with tx plan.   Junius Finner, PA-C 06/13/14 1347  [name], MD 06/14/14 1455

## 2014-06-13 NOTE — ED Notes (Addendum)
Headache for a week. She has passed out 3 x's over this period of time. Spoke to her neurologist and he advised her to come to the ED. She drove herself here.

## 2014-06-13 NOTE — ED Notes (Signed)
Pt states she has a history of migraines, states she is unsure if this ha is related to her migraines or her ms.

## 2014-06-13 NOTE — Discharge Instructions (Signed)
Dizziness   Dizziness means you feel unsteady or lightheaded. You might feel like you are going to pass out (faint).  HOME CARE   · Drink enough fluids to keep your pee (urine) clear or pale yellow.  · Take your medicines exactly as told by your doctor. If you take blood pressure medicine, always stand up slowly from the lying or sitting position. Hold on to something to steady yourself.  · If you need to stand in one place for a long time, move your legs often. Tighten and relax your leg muscles.  · Have someone stay with you until you feel okay.  · Do not drive or use heavy machinery if you feel dizzy.  · Do not drink alcohol.  GET HELP RIGHT AWAY IF:   · You feel dizzy or lightheaded and it gets worse.  · You feel sick to your stomach (nauseous), or you throw up (vomit).  · You have trouble talking or walking.  · You feel weak or have trouble using your arms, hands, or legs.  · You cannot think clearly or have trouble forming sentences.  · You have chest pain, belly (abdominal) pain, sweating, or you are short of breath.  · Your vision changes.  · You are bleeding.  · You have problems from your medicine that seem to be getting worse.  MAKE SURE YOU:   · Understand these instructions.  · Will watch your condition.  · Will get help right away if you are not doing well or get worse.  Document Released: 02/07/2011 Document Revised: 05/13/2011 Document Reviewed: 02/07/2011  Exit[employer]® Patient Information ©2015 [employer]. This information is not intended to replace advice given to you by your health care provider. Make sure you discuss any questions you have with your health care provider.

## 2014-06-13 NOTE — ED Notes (Signed)
Pt given water and sprite for po challenge. Reports that her nausea "is much better".

## 2014-06-19 ENCOUNTER — Emergency Department (HOSPITAL_BASED_OUTPATIENT_CLINIC_OR_DEPARTMENT_OTHER)
Admission: EM | Admit: 2014-06-19 | Discharge: 2014-06-19 | Disposition: A | Discharge status: Home / Self Care | Payer: Federal, State, Local not specified - PPO | Attending: Emergency Medicine | Admitting: Emergency Medicine

## 2014-06-19 ENCOUNTER — Encounter (HOSPITAL_BASED_OUTPATIENT_CLINIC_OR_DEPARTMENT_OTHER): Payer: Self-pay | Admitting: Emergency Medicine

## 2014-06-19 DIAGNOSIS — Z792 Long term (current) use of antibiotics: Secondary | ICD-10-CM | POA: Insufficient documentation

## 2014-06-19 DIAGNOSIS — Z79899 Other long term (current) drug therapy: Secondary | ICD-10-CM | POA: Insufficient documentation

## 2014-06-19 DIAGNOSIS — F4310 Post-traumatic stress disorder, unspecified: Secondary | ICD-10-CM | POA: Insufficient documentation

## 2014-06-19 DIAGNOSIS — G40909 Epilepsy, unspecified, not intractable, without status epilepticus: Secondary | ICD-10-CM | POA: Insufficient documentation

## 2014-06-19 DIAGNOSIS — G43909 Migraine, unspecified, not intractable, without status migrainosus: Secondary | ICD-10-CM | POA: Insufficient documentation

## 2014-06-19 DIAGNOSIS — N390 Urinary tract infection, site not specified: Secondary | ICD-10-CM | POA: Diagnosis not present

## 2014-06-19 DIAGNOSIS — Z8679 Personal history of other diseases of the circulatory system: Secondary | ICD-10-CM | POA: Diagnosis not present

## 2014-06-19 DIAGNOSIS — Z862 Personal history of diseases of the blood and blood-forming organs and certain disorders involving the immune mechanism: Secondary | ICD-10-CM | POA: Insufficient documentation

## 2014-06-19 DIAGNOSIS — R350 Frequency of micturition: Secondary | ICD-10-CM | POA: Diagnosis present

## 2014-06-19 LAB — URINALYSIS, ROUTINE W REFLEX MICROSCOPIC
Bilirubin Urine: NEGATIVE
Glucose, UA: NEGATIVE mg/dL
Hgb urine dipstick: NEGATIVE
KETONES UR: NEGATIVE mg/dL
Nitrite: POSITIVE — AB
PH: 7.5 (ref 5.0–8.0)
PROTEIN: NEGATIVE mg/dL
Specific Gravity, Urine: 1.01 (ref 1.005–1.030)
Urobilinogen, UA: 0.2 mg/dL (ref 0.0–1.0)

## 2014-06-19 LAB — URINE MICROSCOPIC-ADD ON

## 2014-06-19 MED ORDER — CEPHALEXIN 500 MG PO CAPS: 500.0000 mg | ORAL_CAPSULE | Freq: Three times a day (TID) | ORAL | Status: DC

## 2014-06-19 MED ORDER — LIDOCAINE HCL (PF) 1 % IJ SOLN
INTRAMUSCULAR | Status: AC
  Administered 2014-06-19: 5 mL
  Filled 2014-06-19: qty 5

## 2014-06-19 MED ORDER — CEFTRIAXONE SODIUM 1 G IJ SOLR
1.0000 g | Freq: Once | INTRAMUSCULAR | Status: AC
  Administered 2014-06-19: 1 g via INTRAMUSCULAR
  Filled 2014-06-19: qty 10

## 2014-06-19 NOTE — ED Provider Notes (Signed)
CSN: [id number]     Arrival date & time 06/19/14  1191 History   First MD Initiated Contact with Patient 06/19/14 [phone]     Chief Complaint  Patient presents with  . Urinary Frequency     (Consider location/radiation/quality/duration/timing/severity/associated sxs/prior Treatment) Patient is a 44 y.o. female presenting with frequency and back pain. The history is provided by the patient.  Urinary Frequency This is a new problem. The current episode started 2 days ago. The problem occurs constantly. The problem has not changed since onset.Pertinent negatives include no chest pain, no abdominal pain, no headaches and no shortness of breath. Nothing aggravates the symptoms. Nothing relieves the symptoms. She has tried nothing for the symptoms. The treatment provided no relief.  Back Pain Location:  Lumbar spine Quality:  Aching Radiates to:  Does not radiate Pain severity:  Mild Onset quality:  Gradual Duration:  2 days Timing:  Constant Progression:  Unchanged Chronicity:  New Context comment:  Possible UTI Relieved by:  Nothing Worsened by:  Nothing tried Ineffective treatments:  None tried Associated symptoms: dysuria   Associated symptoms: no abdominal pain, no chest pain, no fever and no headaches     Past Medical History  Diagnosis Date  . Migraine   . Trigeminal neuralgia   . Molar pregnancy   . Seizures   . PTSD (post-traumatic stress disorder)   . Pseudotumor cerebri   . MS (multiple sclerosis)   . Mitral valve prolapse   . Iron deficiency anemia 10/18/2013   Past Surgical History  Procedure Laterality Date  . Appendectomy    . Breast surgery    . Cholecystectomy    . Gastric bypass    . Tonsillectomy    . Carpal tunnel release    . Vaginal hysterectomy    . Bilateral salpingoophorectomy     Family History  Problem Relation Age of Onset  . Cancer Maternal Aunt     Colon Cancer   . Alcohol abuse Maternal Uncle   . Cancer Paternal Grandfather     Colon  Cancer   History  Substance Use Topics  . Smoking status: Never Smoker   . Smokeless tobacco: Never Used  . Alcohol Use: No   OB History    No data available     Review of Systems  Constitutional: Negative for fever and fatigue.  HENT: Negative for congestion and drooling.   Eyes: Negative for pain.  Respiratory: Negative for cough and shortness of breath.   Cardiovascular: Negative for chest pain.  Gastrointestinal: Negative for nausea, vomiting, abdominal pain and diarrhea.  Genitourinary: Positive for dysuria and frequency. Negative for hematuria.  Musculoskeletal: Positive for back pain. Negative for gait problem and neck pain.  Skin: Negative for color change.  Neurological: Negative for dizziness and headaches.  Hematological: Negative for adenopathy.  Psychiatric/Behavioral: Negative for behavioral problems.  All other systems reviewed and are negative.     Allergies  Demerol; Morphine and related; and Other  Home Medications   Prior to Admission medications   Medication Sig Start Date End Date Taking? Authorizing Provider  acetaZOLAMIDE (DIAMOX) 250 MG tablet Take 500 mg by mouth 2 (two) times daily.     Historical Provider, MD  acyclovir (ZOVIRAX) 800 MG tablet Take 800 mg by mouth 2 (two) times daily.  01/29/13   Historical Provider, MD  amoxicillin (AMOXIL) 500 MG capsule Take 2 capsules (1,000 mg total) by mouth 2 (two) times daily. 05/24/14   Mirian Mo, MD  BIOTIN PO  Take 1 tablet by mouth daily.    Historical Provider, MD  Calcium Citrate-Vitamin D 500-500 MG-UNIT CHEW Chew 2 tablets by mouth 2 (two) times daily.    Historical Provider, MD  carisoprodol (SOMA) 350 MG tablet Take 350 mg by mouth 3 (three) times daily as needed for muscle spasms.  12/28/12   Historical Provider, MD  cefpodoxime (VANTIN) 100 MG tablet Take 1 tablet (100 mg total) by mouth 2 (two) times daily. 05/24/14   Mirian Mo, MD  cephALEXin (KEFLEX) 250 MG capsule Take 1 capsule  (250 mg total) by mouth 4 (four) times daily. 05/04/14   Loren Racer, MD  cetirizine (ZYRTEC) 10 MG tablet Take 10 mg by mouth daily.      Historical Provider, MD  clarithromycin (BIAXIN) 500 MG tablet Take 1 tablet (500 mg total) by mouth 2 (two) times daily. 05/24/14   Mirian Mo, MD  clonazePAM (KLONOPIN) 1 MG tablet Take 0.5 mg by mouth 3 (three) times daily as needed for anxiety.     Historical Provider, MD  dicyclomine (BENTYL) 20 MG tablet Take 1 tablet (20 mg total) by mouth 2 (two) times daily as needed for spasms. 05/04/14   Loren Racer, MD  Dimethyl Fumarate (TECFIDERA) 240 MG CPDR Take 240 mg by mouth 2 (two) times daily.     Historical Provider, MD  diphenhydrAMINE (BENADRYL) 25 mg capsule Take 25 mg by mouth 2 (two) times daily. With Tecfidera.    Historical Provider, MD  estradiol (ESTRACE) 1 MG tablet Take 1 mg by mouth daily.    Historical Provider, MD  gabapentin (NEURONTIN) 600 MG tablet Take 1,200 mg by mouth at bedtime.    Historical Provider, MD  HYDROcodone-acetaminophen (NORCO/VICODIN) 5-325 MG per tablet Take 1 tablet by mouth every 6 (six) hours as needed for moderate pain.    Historical Provider, MD  HYDROcodone-acetaminophen (VICODIN) 5-500 MG per tablet Take 1 tablet by mouth every 6 (six) hours as needed for pain. Patient not taking: Reported on 05/03/2014 04/30/14   Maryruth Bun Rama, MD  LamoTRIgine XR 200 MG TB24 Take 200 mg by mouth 2 (two) times daily.    Historical Provider, MD  LevETIRAcetam (KEPPRA PO) Take 500 mg by mouth 2 (two) times daily.     Historical Provider, MD  meclizine (ANTIVERT) 25 MG tablet Take 25 mg by mouth 3 (three) times daily as needed for dizziness.  01/25/13   Historical Provider, MD  metoCLOPramide (REGLAN) 5 MG tablet Take 1 tablet (5 mg total) by mouth 4 (four) times daily. 04/30/14   Maryruth Bun Rama, MD  Multiple Vitamin (MULTIVITAMIN WITH MINERALS) TABS tablet Take 1 tablet by mouth daily.    Historical Provider, MD  NON FORMULARY  Estradiol cream as directed (daily), compounded from [facility]. Dose unknown    Historical Provider, MD  nortriptyline (PAMELOR) 50 MG capsule Take 100 mg by mouth at bedtime.    Historical Provider, MD  omeprazole (PRILOSEC) 20 MG capsule Take 1 capsule (20 mg total) by mouth daily. 05/24/14   Mirian Mo, MD  polyethylene glycol [facility] / [facility]) packet Take 17 g by mouth daily. 05/04/14   Marissa Sciacca, PA-C  promethazine (PHENERGAN) 25 MG tablet Take 25 mg by mouth every 6 (six) hours as needed for nausea.     Historical Provider, MD  pseudoephedrine (SUDAFED) 30 MG tablet Take 60 mg by mouth at bedtime as needed for congestion.    Historical Provider, MD  SUMAtriptan (IMITREX) 100 MG tablet Take  100 mg by mouth every 2 (two) hours as needed for migraine or headache.     Historical Provider, MD  traMADol (ULTRAM) 50 MG tablet Take 1 tablet by mouth every 6 (six) hours as needed. pain 01/26/14   Historical Provider, MD   BP 122/69 mmHg  Pulse 104  Temp(Src) 98.8 F (37.1 C) (Oral)  Resp 18  Ht  (1.676 m)  Wt 179 lb (81.194 kg)  BMI 28.91 kg/m2  SpO2 98% Physical Exam  Constitutional: She is oriented to person, place, and time. She appears well-developed and well-nourished.  HENT:  Head: Normocephalic.  Mouth/Throat: Oropharynx is clear and moist. No oropharyngeal exudate.  Eyes: Conjunctivae and EOM are normal. Pupils are equal, round, and reactive to light.  Neck: Normal range of motion. Neck supple.  Cardiovascular: Normal rate, regular rhythm, normal heart sounds and intact distal pulses.  Exam reveals no gallop and no friction rub.   No murmur heard. Pulmonary/Chest: Effort normal and breath sounds normal. No respiratory distress. She has no wheezes.  Abdominal: Soft. Bowel sounds are normal. There is no tenderness. There is no rebound and no guarding.  Musculoskeletal: Normal range of motion. She exhibits no edema or tenderness.  No significant CVA tenderness.   Neurological: She is alert and oriented to person, place, and time.  Skin: Skin is warm and dry.  Psychiatric: She has a normal mood and affect. Her behavior is normal.  Nursing note and vitals reviewed.   ED Course  Procedures (including critical care time) Labs Review Labs Reviewed  URINALYSIS, ROUTINE W REFLEX MICROSCOPIC - Abnormal; Notable for the following:    Nitrite POSITIVE (*)    Leukocytes, UA TRACE (*)    All other components within normal limits  URINE MICROSCOPIC-ADD ON - Abnormal; Notable for the following:    Bacteria, UA MANY (*)    All other components within normal limits  URINE CULTURE    Imaging Review No results found.   EKG Interpretation None      MDM   Final diagnoses:  UTI (lower urinary tract infection)    8:58 AM 44 y.o. female w hx of MS, psuedotumor cerebri, chronic abd pain, seizures who presents with urinary frequency, dysuria, low back pain which began over the last few days. She states that she has had recurrent UTIs over the last few months and was recently treated with pain at the end of March with resolution of her symptoms. She denies any fevers, vomiting, or diarrhea. She denies any vaginal discharge or other GU symptoms. Vital signs unremarkable here. She is concerned about some sediment in her urine which she brought with her this morning. There does appear to be some sediment with some skin cells. We'll get urinalysis.  10:14 AM: UA c/w infection. Pt requested tx although I felt awaiting urine cx was an option given repeat UTI's recently and no cx. Gave rocephin IM, will start on keflex.  I have discussed the diagnosis/risks/treatment options with the patient and believe the pt to be eligible for discharge home to follow-up with [facility]RO. We also discussed returning to the ED immediately if new or worsening sx occur. We discussed the sx which are most concerning (e.g., fever, vomiting, worsening low back pain) that necessitate immediate  return. Medications administered to the patient during their visit and any new prescriptions provided to the patient are listed below.  Medications given during this visit Medications  cefTRIAXone (ROCEPHIN) injection 1 g (not administered)    Discharge Medication  List as of 06/19/2014 10:15 AM    START taking these medications   Details  !! cephALEXin (KEFLEX) 500 MG capsule Take 1 capsule (500 mg total) by mouth 3 (three) times daily., Starting 06/19/2014, Until Discontinued, Print     !! - Potential duplicate medications found. Please discuss with provider.       Purvis Sheffield, MD 06/19/14 [phone]

## 2014-06-19 NOTE — ED Notes (Signed)
Pt states has been treated since march for UTI with several different antibiotics including rifampin, amoxicillin and one other, completing course of rx treatment about 1 week ago.  Pt also c/o bladder spasms.  This morning, pt went to bathroom and noticed "mucus" that had come from her bladder.  Pt scooped the mucus from the toilet bowl into a bag to bring in to show [place].  Pt states lower back pain in past 2 days along with "uncomfortable" bladder spasms.  Pt also c/o strong odor with urination.  Pt h/o includes dx of a "mass" in her bladder.

## 2014-06-21 LAB — URINE CULTURE: Colony Count: >=100000

## 2014-06-22 ENCOUNTER — Telehealth (HOSPITAL_COMMUNITY): Payer: Self-pay

## 2014-06-22 NOTE — Telephone Encounter (Signed)
Post ED Visit - Positive Culture Follow-up  Culture report reviewed by antimicrobial stewardship pharmacist: []  Wes Dulaney, Pharm.D., BCPS []  Celedonio Miyamoto, Pharm.D., BCPS []  Georgina Pillion, Pharm.D., BCPS []  [place], [place].D., BCPS, AAHIVP []  Estella Husk, Pharm.D., BCPS, AAHIVP []  Elder Cyphers, [address].D., BCPS [facility]ass Positive urine culture Treated with cephalexin, organism sensitive to the same and no further patient follow-up is required at this time.  Ashley Jacobs 06/22/2014, 3:35 PM

## 2014-07-23 ENCOUNTER — Encounter (HOSPITAL_BASED_OUTPATIENT_CLINIC_OR_DEPARTMENT_OTHER): Payer: Self-pay | Admitting: *Deleted

## 2014-07-23 ENCOUNTER — Emergency Department (HOSPITAL_BASED_OUTPATIENT_CLINIC_OR_DEPARTMENT_OTHER)
Admission: EM | Admit: 2014-07-23 | Discharge: 2014-07-23 | Disposition: A | Discharge status: Home / Self Care | Payer: Federal, State, Local not specified - PPO | Attending: Emergency Medicine | Admitting: Emergency Medicine

## 2014-07-23 DIAGNOSIS — F4310 Post-traumatic stress disorder, unspecified: Secondary | ICD-10-CM | POA: Diagnosis not present

## 2014-07-23 DIAGNOSIS — G43909 Migraine, unspecified, not intractable, without status migrainosus: Secondary | ICD-10-CM

## 2014-07-23 DIAGNOSIS — Z8679 Personal history of other diseases of the circulatory system: Secondary | ICD-10-CM | POA: Insufficient documentation

## 2014-07-23 DIAGNOSIS — G40909 Epilepsy, unspecified, not intractable, without status epilepticus: Secondary | ICD-10-CM | POA: Diagnosis not present

## 2014-07-23 DIAGNOSIS — Z792 Long term (current) use of antibiotics: Secondary | ICD-10-CM | POA: Diagnosis not present

## 2014-07-23 DIAGNOSIS — G500 Trigeminal neuralgia: Secondary | ICD-10-CM | POA: Diagnosis not present

## 2014-07-23 DIAGNOSIS — Z79899 Other long term (current) drug therapy: Secondary | ICD-10-CM | POA: Insufficient documentation

## 2014-07-23 DIAGNOSIS — Z862 Personal history of diseases of the blood and blood-forming organs and certain disorders involving the immune mechanism: Secondary | ICD-10-CM | POA: Insufficient documentation

## 2014-07-23 MED ORDER — SODIUM CHLORIDE 0.9 % IV BOLUS (SEPSIS)
1000.0000 mL | Freq: Once | INTRAVENOUS | Status: AC
  Administered 2014-07-23: 1000 mL via INTRAVENOUS

## 2014-07-23 MED ORDER — KETOROLAC TROMETHAMINE 30 MG/ML IJ SOLN
30.0000 mg | Freq: Once | INTRAMUSCULAR | Status: AC
  Administered 2014-07-23: 30 mg via INTRAVENOUS
  Filled 2014-07-23: qty 1

## 2014-07-23 MED ORDER — DIPHENHYDRAMINE HCL 50 MG/ML IJ SOLN
25.0000 mg | Freq: Once | INTRAMUSCULAR | Status: AC
  Administered 2014-07-23: 25 mg via INTRAVENOUS
  Filled 2014-07-23: qty 1

## 2014-07-23 MED ORDER — DEXAMETHASONE SODIUM PHOSPHATE 10 MG/ML IJ SOLN
10.0000 mg | Freq: Once | INTRAMUSCULAR | Status: AC
  Administered 2014-07-23: 10 mg via INTRAVENOUS
  Filled 2014-07-23: qty 1

## 2014-07-23 MED ORDER — METOCLOPRAMIDE HCL 5 MG/ML IJ SOLN
10.0000 mg | Freq: Once | INTRAMUSCULAR | Status: AC
  Administered 2014-07-23: 10 mg via INTRAVENOUS
  Filled 2014-07-23: qty 2

## 2014-07-23 NOTE — ED Provider Notes (Signed)
CSN: [id number]     Arrival date & time 07/23/14  0003 History   First MD Initiated Contact with Patient 07/23/14 0030     Chief Complaint  Patient presents with  . Migraine     (Consider location/radiation/quality/duration/timing/severity/associated sxs/prior Treatment) HPI Comments: Patient is a 44 year old female with history of recurrent migraine headaches. She presents with a migraine that is not responding to her home medications. She denies any injury or trauma. She denies any visual disturbances. She reports that this is identical to her migraines, however is worse than normal.  Patient is a 44 y.o. female presenting with migraines. The history is provided by the patient.  Migraine This is a new problem. The current episode started 2 days ago. The problem occurs constantly. The problem has been gradually worsening. Associated symptoms include headaches. Nothing aggravates the symptoms. Nothing relieves the symptoms. Treatments tried: Imitrex and home medications. The treatment provided no relief.    Past Medical History  Diagnosis Date  . Migraine   . Trigeminal neuralgia   . Molar pregnancy   . Seizures   . PTSD (post-traumatic stress disorder)   . Pseudotumor cerebri   . MS (multiple sclerosis)   . Mitral valve prolapse   . Iron deficiency anemia 10/18/2013   Past Surgical History  Procedure Laterality Date  . Appendectomy    . Breast surgery    . Cholecystectomy    . Gastric bypass    . Tonsillectomy    . Carpal tunnel release    . Vaginal hysterectomy    . Bilateral salpingoophorectomy     Family History  Problem Relation Age of Onset  . Cancer Maternal Aunt     Colon Cancer   . Alcohol abuse Maternal Uncle   . Cancer Paternal Grandfather     Colon Cancer   History  Substance Use Topics  . Smoking status: Never Smoker   . Smokeless tobacco: Never Used  . Alcohol Use: No   OB History    No data available     Review of Systems  Neurological:  Positive for headaches.  All other systems reviewed and are negative.     Allergies  Demerol; Morphine and related; and Nucynta  Home Medications   Prior to Admission medications   Medication Sig Start Date End Date Taking? Authorizing Provider  acetaZOLAMIDE (DIAMOX) 250 MG tablet Take 500 mg by mouth 2 (two) times daily.     Historical Provider, MD  acyclovir (ZOVIRAX) 800 MG tablet Take 800 mg by mouth 2 (two) times daily.  01/29/13   Historical Provider, MD  amoxicillin (AMOXIL) 500 MG capsule Take 2 capsules (1,000 mg total) by mouth 2 (two) times daily. 05/24/14   Mirian Mo, MD  BIOTIN PO Take 1 tablet by mouth daily.    Historical Provider, MD  Calcium Citrate-Vitamin D 500-500 MG-UNIT CHEW Chew 2 tablets by mouth 2 (two) times daily.    Historical Provider, MD  carisoprodol (SOMA) 350 MG tablet Take 350 mg by mouth 3 (three) times daily as needed for muscle spasms.  12/28/12   Historical Provider, MD  cefpodoxime (VANTIN) 100 MG tablet Take 1 tablet (100 mg total) by mouth 2 (two) times daily. 05/24/14   Mirian Mo, MD  cephALEXin (KEFLEX) 250 MG capsule Take 1 capsule (250 mg total) by mouth 4 (four) times daily. 05/04/14   Loren Racer, MD  cephALEXin (KEFLEX) 500 MG capsule Take 1 capsule (500 mg total) by mouth 3 (three) times daily. 06/19/14  Purvis Sheffield, MD  cetirizine (ZYRTEC) 10 MG tablet Take 10 mg by mouth daily.      Historical Provider, MD  clarithromycin (BIAXIN) 500 MG tablet Take 1 tablet (500 mg total) by mouth 2 (two) times daily. 05/24/14   Mirian Mo, MD  clonazePAM (KLONOPIN) 1 MG tablet Take 0.5 mg by mouth 3 (three) times daily as needed for anxiety.     Historical Provider, MD  dicyclomine (BENTYL) 20 MG tablet Take 1 tablet (20 mg total) by mouth 2 (two) times daily as needed for spasms. 05/04/14   Loren Racer, MD  Dimethyl Fumarate (TECFIDERA) 240 MG CPDR Take 240 mg by mouth 2 (two) times daily.     Historical Provider, MD   diphenhydrAMINE (BENADRYL) 25 mg capsule Take 25 mg by mouth 2 (two) times daily. With Tecfidera.    Historical Provider, MD  estradiol (ESTRACE) 1 MG tablet Take 1 mg by mouth daily.    Historical Provider, MD  gabapentin (NEURONTIN) 600 MG tablet Take 1,200 mg by mouth at bedtime.    Historical Provider, MD  HYDROcodone-acetaminophen (NORCO/VICODIN) 5-325 MG per tablet Take 1 tablet by mouth every 6 (six) hours as needed for moderate pain.    Historical Provider, MD  HYDROcodone-acetaminophen (VICODIN) 5-500 MG per tablet Take 1 tablet by mouth every 6 (six) hours as needed for pain. Patient not taking: Reported on 05/03/2014 04/30/14   Maryruth Bun Rama, MD  LamoTRIgine XR 200 MG TB24 Take 200 mg by mouth 2 (two) times daily.    Historical Provider, MD  LevETIRAcetam (KEPPRA PO) Take 500 mg by mouth 2 (two) times daily.     Historical Provider, MD  meclizine (ANTIVERT) 25 MG tablet Take 25 mg by mouth 3 (three) times daily as needed for dizziness.  01/25/13   Historical Provider, MD  metoCLOPramide (REGLAN) 5 MG tablet Take 1 tablet (5 mg total) by mouth 4 (four) times daily. 04/30/14   Maryruth Bun Rama, MD  Multiple Vitamin (MULTIVITAMIN WITH MINERALS) TABS tablet Take 1 tablet by mouth daily.    Historical Provider, MD  NON FORMULARY Estradiol cream as directed (daily), compounded from [facility]. Dose unknown    Historical Provider, MD  nortriptyline (PAMELOR) 50 MG capsule Take 100 mg by mouth at bedtime.    Historical Provider, MD  omeprazole (PRILOSEC) 20 MG capsule Take 1 capsule (20 mg total) by mouth daily. 05/24/14   Mirian Mo, MD  polyethylene glycol [facility] / Ethelene Hal) packet Take 17 g by mouth daily. 05/04/14   Marissa Sciacca, PA-C  promethazine (PHENERGAN) 25 MG tablet Take 25 mg by mouth every 6 (six) hours as needed for nausea.     Historical Provider, MD  pseudoephedrine (SUDAFED) 30 MG tablet Take 60 mg by mouth at bedtime as needed for congestion.    Historical Provider,  MD  SUMAtriptan (IMITREX) 100 MG tablet Take 100 mg by mouth every 2 (two) hours as needed for migraine or headache.     Historical Provider, MD  traMADol (ULTRAM) 50 MG tablet Take 1 tablet by mouth every 6 (six) hours as needed. pain 01/26/14   Historical Provider, MD   BP 117/78 mmHg  Pulse 101  Temp(Src) 98 F (36.7 C) (Oral)  Resp 18  Ht  (1.676 m)  Wt 174 lb (78.926 kg)  BMI 28.10 kg/m2  SpO2 100% Physical Exam  Constitutional: She is oriented to person, place, and time. She appears well-developed and well-nourished. No distress.  HENT:  Head: Normocephalic  and atraumatic.  Eyes: EOM are normal. Pupils are equal, round, and reactive to light.  There is no papilledema on funduscopic exam.  Neck: Normal range of motion. Neck supple.  Cardiovascular: Normal rate and regular rhythm.  Exam reveals no gallop and no friction rub.   No murmur heard. Pulmonary/Chest: Effort normal and breath sounds normal. No respiratory distress. She has no wheezes.  Abdominal: Soft. Bowel sounds are normal. She exhibits no distension. There is no tenderness.  Musculoskeletal: Normal range of motion.  Neurological: She is alert and oriented to person, place, and time. No cranial nerve deficit. She exhibits normal muscle tone. Coordination normal.  Skin: Skin is warm and dry. She is not diaphoretic.  Nursing note and vitals reviewed.   ED Course  Procedures (including critical care time) Labs Review Labs Reviewed - No data to display  Imaging Review No results found.   EKG Interpretation None      MDM   Final diagnoses:  None    Patient presents with complaints of migraine headache. She has a history of the same. She is feeling better with medications given in the ER and is ready to go home. Her neurologic exam is nonfocal and there are no red flags that would suggest an emergent situation. I see no indication for LP or CT.    [name], MD 07/23/14 [phone]

## 2014-07-23 NOTE — Discharge Instructions (Signed)
Continue your home medications as before.  Follow up with your neurologist if not improving in the next few days.   Migraine Headache A migraine headache is an intense, throbbing pain on one or both sides of your head. A migraine can last for 30 minutes to several hours. CAUSES  The exact cause of a migraine headache is not always known. However, a migraine may be caused when nerves in the brain become irritated and release chemicals that cause inflammation. This causes pain. Certain things may also trigger migraines, such as:  Alcohol.  Smoking.  Stress.  Menstruation.  Aged cheeses.  Foods or drinks that contain nitrates, glutamate, aspartame, or tyramine.  Lack of sleep.  Chocolate.  Caffeine.  Hunger.  Physical exertion.  Fatigue.  Medicines used to treat chest pain (nitroglycerine), birth control pills, estrogen, and some blood pressure medicines. SIGNS AND SYMPTOMS  Pain on one or both sides of your head.  Pulsating or throbbing pain.  Severe pain that prevents daily activities.  Pain that is aggravated by any physical activity.  Nausea, vomiting, or both.  Dizziness.  Pain with exposure to bright lights, loud noises, or activity.  General sensitivity to bright lights, loud noises, or smells. Before you get a migraine, you may get warning signs that a migraine is coming (aura). An aura may include:  Seeing flashing lights.  Seeing bright spots, halos, or zigzag lines.  Having tunnel vision or blurred vision.  Having feelings of numbness or tingling.  Having trouble talking.  Having muscle weakness. DIAGNOSIS  A migraine headache is often diagnosed based on:  Symptoms.  Physical exam.  A CT scan or MRI of your head. These imaging tests cannot diagnose migraines, but they can help rule out other causes of headaches. TREATMENT Medicines may be given for pain and nausea. Medicines can also be given to help prevent recurrent migraines.    HOME CARE INSTRUCTIONS  Only take over-the-counter or prescription medicines for pain or discomfort as directed by your health care provider. The use of long-term narcotics is not recommended.  Lie down in a dark, quiet room when you have a migraine.  Keep a journal to find out what may trigger your migraine headaches. For example, write down:  What you eat and drink.  How much sleep you get.  Any change to your diet or medicines.  Limit alcohol consumption.  Quit smoking if you smoke.  Get 7-9 hours of sleep, or as recommended by your health care provider.  Limit stress.  Keep lights dim if bright lights bother you and make your migraines worse. SEEK IMMEDIATE MEDICAL CARE IF:   Your migraine becomes severe.  You have a fever.  You have a stiff neck.  You have vision loss.  You have muscular weakness or loss of muscle control.  You start losing your balance or have trouble walking.  You feel faint or pass out.  You have severe symptoms that are different from your first symptoms. MAKE SURE YOU:   Understand these instructions.  Will watch your condition.  Will get help right away if you are not doing well or get worse. Document Released: 02/18/2005 Document Revised: 07/05/2013 Document Reviewed: 10/26/2012 [facility] Patient Information 2015 [place], [place]. This information is not intended to replace advice given to you by your health care provider. Make sure you discuss any questions you have with your health care provider.

## 2014-07-23 NOTE — ED Notes (Signed)
Migraine since Wednesday.  Has been seen by her neurologist without relief.

## 2014-08-01 ENCOUNTER — Emergency Department (HOSPITAL_BASED_OUTPATIENT_CLINIC_OR_DEPARTMENT_OTHER): Payer: Federal, State, Local not specified - PPO

## 2014-08-01 ENCOUNTER — Encounter (HOSPITAL_BASED_OUTPATIENT_CLINIC_OR_DEPARTMENT_OTHER): Payer: Self-pay | Admitting: *Deleted

## 2014-08-01 ENCOUNTER — Emergency Department (HOSPITAL_BASED_OUTPATIENT_CLINIC_OR_DEPARTMENT_OTHER)
Admission: EM | Admit: 2014-08-01 | Discharge: 2014-08-02 | Disposition: A | Discharge status: Home / Self Care | Payer: Federal, State, Local not specified - PPO | Attending: Emergency Medicine | Admitting: Emergency Medicine

## 2014-08-01 DIAGNOSIS — F4310 Post-traumatic stress disorder, unspecified: Secondary | ICD-10-CM | POA: Insufficient documentation

## 2014-08-01 DIAGNOSIS — G500 Trigeminal neuralgia: Secondary | ICD-10-CM | POA: Diagnosis not present

## 2014-08-01 DIAGNOSIS — R109 Unspecified abdominal pain: Secondary | ICD-10-CM | POA: Diagnosis present

## 2014-08-01 DIAGNOSIS — I349 Nonrheumatic mitral valve disorder, unspecified: Secondary | ICD-10-CM | POA: Insufficient documentation

## 2014-08-01 DIAGNOSIS — G40909 Epilepsy, unspecified, not intractable, without status epilepticus: Secondary | ICD-10-CM | POA: Diagnosis not present

## 2014-08-01 DIAGNOSIS — Z79899 Other long term (current) drug therapy: Secondary | ICD-10-CM | POA: Diagnosis not present

## 2014-08-01 DIAGNOSIS — K599 Functional intestinal disorder, unspecified: Secondary | ICD-10-CM

## 2014-08-01 DIAGNOSIS — K598 Other specified functional intestinal disorders: Secondary | ICD-10-CM | POA: Diagnosis not present

## 2014-08-01 DIAGNOSIS — Z862 Personal history of diseases of the blood and blood-forming organs and certain disorders involving the immune mechanism: Secondary | ICD-10-CM | POA: Insufficient documentation

## 2014-08-01 DIAGNOSIS — Z792 Long term (current) use of antibiotics: Secondary | ICD-10-CM | POA: Diagnosis not present

## 2014-08-01 DIAGNOSIS — G43909 Migraine, unspecified, not intractable, without status migrainosus: Secondary | ICD-10-CM | POA: Diagnosis not present

## 2014-08-01 LAB — COMPREHENSIVE METABOLIC PANEL
ALBUMIN: 4 g/dL (ref 3.5–5.0)
ALK PHOS: 62 U/L (ref 38–126)
ALT: 58 U/L — AB (ref 14–54)
ANION GAP: 11 (ref 5–15)
AST: 47 U/L — AB (ref 15–41)
BUN: 11 mg/dL (ref 6–20)
CHLORIDE: 97 mmol/L — AB (ref 101–111)
CO2: 29 mmol/L (ref 22–32)
CREATININE: 0.76 mg/dL (ref 0.44–1.00)
Calcium: 9.1 mg/dL (ref 8.9–10.3)
GFR calc Af Amer: >60 mL/min (ref >60)
GFR calc non Af Amer: >60 mL/min (ref >60)
Glucose, Bld: 100 mg/dL — ABNORMAL HIGH (ref 65–99)
POTASSIUM: 3.4 mmol/L — AB (ref 3.5–5.1)
Sodium: 137 mmol/L (ref 135–145)
TOTAL PROTEIN: 7 g/dL (ref 6.5–8.1)
Total Bilirubin: 0.1 mg/dL — ABNORMAL LOW (ref 0.3–1.2)

## 2014-08-01 LAB — URINALYSIS, ROUTINE W REFLEX MICROSCOPIC
Bilirubin Urine: NEGATIVE
GLUCOSE, UA: NEGATIVE mg/dL
KETONES UR: NEGATIVE mg/dL
Leukocytes, UA: NEGATIVE
Nitrite: NEGATIVE
Protein, ur: NEGATIVE mg/dL
Specific Gravity, Urine: 1.006 (ref 1.005–1.030)
UROBILINOGEN UA: 0.2 mg/dL (ref 0.0–1.0)
pH: 5 (ref 5.0–8.0)

## 2014-08-01 LAB — CBC WITH DIFFERENTIAL/PLATELET
BASOS ABS: 0 10*3/uL (ref 0.0–0.1)
BASOS PCT: 0 % (ref 0–1)
EOS PCT: 1 % (ref 0–5)
Eosinophils Absolute: 0.1 10*3/uL (ref 0.0–0.7)
HCT: 35.9 % — ABNORMAL LOW (ref 36.0–46.0)
HEMOGLOBIN: 10.7 g/dL — AB (ref 12.0–15.0)
Lymphocytes Relative: 34 % (ref 12–46)
Lymphs Abs: 2.4 10*3/uL (ref 0.7–4.0)
MCH: 27.3 pg (ref 26.0–34.0)
MCHC: 29.8 g/dL — AB (ref 30.0–36.0)
MCV: 91.6 fL (ref 78.0–100.0)
Monocytes Absolute: 1.1 10*3/uL — ABNORMAL HIGH (ref 0.1–1.0)
Monocytes Relative: 15 % — ABNORMAL HIGH (ref 3–12)
Neutro Abs: 3.6 10*3/uL (ref 1.7–7.7)
Neutrophils Relative %: 50 % (ref 43–77)
Platelets: 216 10*3/uL (ref 150–400)
RBC: 3.92 MIL/uL (ref 3.87–5.11)
RDW: 13.4 % (ref 11.5–15.5)
WBC: 7.2 10*3/uL (ref 4.0–10.5)

## 2014-08-01 LAB — URINE MICROSCOPIC-ADD ON

## 2014-08-01 LAB — LIPASE, BLOOD: Lipase: 28 U/L (ref 22–51)

## 2014-08-01 MED ORDER — METOCLOPRAMIDE HCL 5 MG/ML IJ SOLN
10.0000 mg | Freq: Once | INTRAMUSCULAR | Status: AC
  Administered 2014-08-01: 10 mg via INTRAVENOUS
  Filled 2014-08-01: qty 2

## 2014-08-01 MED ORDER — SODIUM CHLORIDE 0.9 % IV BOLUS (SEPSIS)
1000.0000 mL | Freq: Once | INTRAVENOUS | Status: AC
  Administered 2014-08-01: 1000 mL via INTRAVENOUS

## 2014-08-01 NOTE — ED Provider Notes (Signed)
CSN: [id number]     Arrival date & time 08/01/14  2301 History   First MD Initiated Contact with Patient 08/01/14 2323     Chief Complaint  Patient presents with  . Abdominal Pain     (Consider location/radiation/quality/duration/timing/severity/associated sxs/prior Treatment) HPI  This is a 44 year old female status post gastric bypass surgery in 2008. She has a history of chronic abdominal pain and has had multiple ED visits for the same. She has had multiple CT scans that were unremarkable. She is here complaining of abdominal pain for the past 3 days. She characterizes it as feeling like her food is not moving along. She has no problem drinking fluids and urinating but is having difficulty eating solid foods. She has been able to tolerate some soft foods like apples. She has had some nausea and vomiting. She has been moving her bowels, last was about 8 hours ago.  She told her triage nurse that her pain was located in the left lower quadrant. She told her primary nurse that her pain was located between her epigastrium unable. She indicates to me that the pain is in her lower abdomen. She rates it a 7 out of 10 but a 10 out of 10 if you touch her abdomen.  She is on chronic narcotics for chronic pain. She has been receiving 120, /325mg  oxycodone/APAP tablets monthly.   Past Medical History  Diagnosis Date  . Migraine   . Trigeminal neuralgia   . Molar pregnancy   . Seizures   . PTSD (post-traumatic stress disorder)   . Pseudotumor cerebri   . MS (multiple sclerosis)   . Mitral valve prolapse   . Iron deficiency anemia 10/18/2013   Past Surgical History  Procedure Laterality Date  . Appendectomy    . Breast surgery    . Cholecystectomy    . Gastric bypass    . Tonsillectomy    . Carpal tunnel release    . Vaginal hysterectomy    . Bilateral salpingoophorectomy     Family History  Problem Relation Age of Onset  . Cancer Maternal Aunt     Colon Cancer   . Alcohol abuse  Maternal Uncle   . Cancer Paternal Grandfather     Colon Cancer   History  Substance Use Topics  . Smoking status: Never Smoker   . Smokeless tobacco: Never Used  . Alcohol Use: No   OB History    No data available     Review of Systems  All other systems reviewed and are negative.   Allergies  Demerol; Morphine and related; and Nucynta  Home Medications   Prior to Admission medications   Medication Sig Start Date End Date Taking? Authorizing Provider  acetaZOLAMIDE (DIAMOX) 250 MG tablet Take 500 mg by mouth 2 (two) times daily.     Historical Provider, MD  acyclovir (ZOVIRAX) 800 MG tablet Take 800 mg by mouth 2 (two) times daily.  01/29/13   Historical Provider, MD  amoxicillin (AMOXIL) 500 MG capsule Take 2 capsules (1,000 mg total) by mouth 2 (two) times daily. 05/24/14   Mirian Mo, MD  BIOTIN PO Take 1 tablet by mouth daily.    Historical Provider, MD  Calcium Citrate-Vitamin D 500-500 MG-UNIT CHEW Chew 2 tablets by mouth 2 (two) times daily.    Historical Provider, MD  carisoprodol (SOMA) 350 MG tablet Take 350 mg by mouth 3 (three) times daily as needed for muscle spasms.  12/28/12   Historical Provider, MD  cefpodoxime (  VANTIN) 100 MG tablet Take 1 tablet (100 mg total) by mouth 2 (two) times daily. 05/24/14   Mirian Mo, MD  cephALEXin (KEFLEX) 250 MG capsule Take 1 capsule (250 mg total) by mouth 4 (four) times daily. 05/04/14   Loren Racer, MD  cephALEXin (KEFLEX) 500 MG capsule Take 1 capsule (500 mg total) by mouth 3 (three) times daily. 06/19/14   Purvis Sheffield, MD  cetirizine (ZYRTEC) 10 MG tablet Take 10 mg by mouth daily.      Historical Provider, MD  clarithromycin (BIAXIN) 500 MG tablet Take 1 tablet (500 mg total) by mouth 2 (two) times daily. 05/24/14   Mirian Mo, MD  clonazePAM (KLONOPIN) 1 MG tablet Take 0.5 mg by mouth 3 (three) times daily as needed for anxiety.     Historical Provider, MD  dicyclomine (BENTYL) 20 MG tablet Take 1 tablet  (20 mg total) by mouth 2 (two) times daily as needed for spasms. 05/04/14   Loren Racer, MD  Dimethyl Fumarate (TECFIDERA) 240 MG CPDR Take 240 mg by mouth 2 (two) times daily.     Historical Provider, MD  diphenhydrAMINE (BENADRYL) 25 mg capsule Take 25 mg by mouth 2 (two) times daily. With Tecfidera.    Historical Provider, MD  estradiol (ESTRACE) 1 MG tablet Take 1 mg by mouth daily.    Historical Provider, MD  gabapentin (NEURONTIN) 600 MG tablet Take 1,200 mg by mouth at bedtime.    Historical Provider, MD  HYDROcodone-acetaminophen (NORCO/VICODIN) 5-325 MG per tablet Take 1 tablet by mouth every 6 (six) hours as needed for moderate pain.    Historical Provider, MD  HYDROcodone-acetaminophen (VICODIN) 5-500 MG per tablet Take 1 tablet by mouth every 6 (six) hours as needed for pain. Patient not taking: Reported on 05/03/2014 04/30/14   Maryruth Bun Rama, MD  LamoTRIgine XR 200 MG TB24 Take 200 mg by mouth 2 (two) times daily.    Historical Provider, MD  LevETIRAcetam (KEPPRA PO) Take 500 mg by mouth 2 (two) times daily.     Historical Provider, MD  meclizine (ANTIVERT) 25 MG tablet Take 25 mg by mouth 3 (three) times daily as needed for dizziness.  01/25/13   Historical Provider, MD  metoCLOPramide (REGLAN) 5 MG tablet Take 1 tablet (5 mg total) by mouth 4 (four) times daily. 04/30/14   Maryruth Bun Rama, MD  Multiple Vitamin (MULTIVITAMIN WITH MINERALS) TABS tablet Take 1 tablet by mouth daily.    Historical Provider, MD  NON FORMULARY Estradiol cream as directed (daily), compounded from [facility]. Dose unknown    Historical Provider, MD  nortriptyline (PAMELOR) 50 MG capsule Take 100 mg by mouth at bedtime.    Historical Provider, MD  omeprazole (PRILOSEC) 20 MG capsule Take 1 capsule (20 mg total) by mouth daily. 05/24/14   Mirian Mo, MD  polyethylene glycol [facility]) packet Take 17 g by mouth daily. 05/04/14   Marissa Sciacca, PA-C  promethazine (PHENERGAN) 25 MG tablet Take  25 mg by mouth every 6 (six) hours as needed for nausea.     Historical Provider, MD  pseudoephedrine (SUDAFED) 30 MG tablet Take 60 mg by mouth at bedtime as needed for congestion.    Historical Provider, MD  SUMAtriptan (IMITREX) 100 MG tablet Take 100 mg by mouth every 2 (two) hours as needed for migraine or headache.     Historical Provider, MD  traMADol (ULTRAM) 50 MG tablet Take 1 tablet by mouth every 6 (six) hours as needed. pain  01/26/14   Historical Provider, MD   BP 104/54 mmHg  Pulse 99  Temp(Src) 98.2 F (36.8 C) (Oral)  Resp 16  Ht 5\' 6"  (1.676 m)  Wt 178 lb (80.74 kg)  BMI 28.74 kg/m2  SpO2 97%   Physical Exam  General: Well-developed, well-nourished female in no acute distress; appearance consistent with age of record HENT: normocephalic; atraumatic Eyes: pupils equal, round and reactive to light; extraocular muscles intact Neck: supple Heart: regular rate and rhythm; tachycardia Lungs: clear to auscultation bilaterally Abdomen: soft; nondistended; suprapubic and right lower quadrant tenderness; no masses or hepatosplenomegaly; bowel sounds present Extremities: No deformity; full range of motion; pulses normal Neurologic: Awake, alert and oriented; motor function intact in all extremities and symmetric; no facial droop Skin: Warm and dry Psychiatric: Flat affect    ED Course  Procedures (including critical care time)   MDM   Nursing notes and vitals signs, including pulse oximetry, reviewed.  Summary of this visit's results, reviewed by myself:  Labs:  Results for orders placed or performed during the hospital encounter of 08/01/14 (from the past 24 hour(s))  Comprehensive metabolic panel     Status: Abnormal   Collection Time: 08/01/14 11:30 PM  Result Value Ref Range   Sodium 137 135 - 145 mmol/L   Potassium 3.4 (L) 3.5 - 5.1 mmol/L   Chloride 97 (L) 101 - 111 mmol/L   CO2 29 22 - 32 mmol/L   Glucose, Bld 100 (H) 65 - 99 mg/dL   BUN 11 6 - 20 mg/dL    Creatinine, Ser 1.02 0.44 - 1.00 mg/dL   Calcium 9.1 8.9 - 72.5 mg/dL   Total Protein 7.0 6.5 - 8.1 g/dL   Albumin 4.0 3.5 - 5.0 g/dL   AST 47 (H) 15 - 41 U/L   ALT 58 (H) 14 - 54 U/L   Alkaline Phosphatase 62 38 - 126 U/L   Total Bilirubin 0.1 (L) 0.3 - 1.2 mg/dL   GFR calc non Af Amer >60 >60 mL/min   GFR calc Af Amer >60 >60 mL/min   Anion gap 11 5 - 15  Lipase, blood     Status: None   Collection Time: 08/01/14 11:30 PM  Result Value Ref Range   Lipase 28 22 - 51 U/L  CBC with Differential/Platelet     Status: Abnormal   Collection Time: 08/01/14 11:30 PM  Result Value Ref Range   WBC 7.2 4.0 - 10.5 K/uL   RBC 3.92 3.87 - 5.11 MIL/uL   Hemoglobin 10.7 (L) 12.0 - 15.0 g/dL   HCT 36.6 (L) 44.0 - 34.7 %   MCV 91.6 78.0 - 100.0 fL   MCH 27.3 26.0 - 34.0 pg   MCHC 29.8 (L) 30.0 - 36.0 g/dL   RDW 42.5 95.6 - 38.7 %   Platelets 216 150 - 400 K/uL   Neutrophils Relative % 50 43 - 77 %   Neutro Abs 3.6 1.7 - 7.7 K/uL   Lymphocytes Relative 34 12 - 46 %   Lymphs Abs 2.4 0.7 - 4.0 K/uL   Monocytes Relative 15 (H) 3 - 12 %   Monocytes Absolute 1.1 (H) 0.1 - 1.0 K/uL   Eosinophils Relative 1 0 - 5 %   Eosinophils Absolute 0.1 0.0 - 0.7 K/uL   Basophils Relative 0 0 - 1 %   Basophils Absolute 0.0 0.0 - 0.1 K/uL  Acetaminophen level     Status: Abnormal   Collection Time: 08/01/14 11:30 PM  Result Value Ref Range   Acetaminophen (Tylenol), Serum <10 (L) 10 - 30 ug/mL  Urinalysis, Routine w reflex microscopic     Status: Abnormal   Collection Time: 08/01/14 11:35 PM  Result Value Ref Range   Color, Urine YELLOW YELLOW   APPearance CLEAR CLEAR   Specific Gravity, Urine 1.006 1.005 - 1.030   pH 5.0 5.0 - 8.0   Glucose, UA NEGATIVE NEGATIVE mg/dL   Hgb urine dipstick TRACE (A) NEGATIVE   Bilirubin Urine NEGATIVE NEGATIVE   Ketones, ur NEGATIVE NEGATIVE mg/dL   Protein, ur NEGATIVE NEGATIVE mg/dL   Urobilinogen, UA 0.2 0.0 - 1.0 mg/dL   Nitrite NEGATIVE NEGATIVE    Leukocytes, UA NEGATIVE NEGATIVE  Urine microscopic-add on     Status: Abnormal   Collection Time: 08/01/14 11:35 PM  Result Value Ref Range   Squamous Epithelial / LPF RARE RARE   WBC, UA 0-2 <3 WBC/hpf   RBC / HPF 0-2 <3 RBC/hpf   Bacteria, UA MANY (A) RARE    Imaging Studies: Dg Abd Acute W/chest  08/02/2014   CLINICAL DATA:  Acute onset of generalized abdominal pain and vomiting. Abdominal bloating. Initial encounter.  EXAM: DG ABDOMEN ACUTE W/ 1V CHEST  COMPARISON:  Chest and abdominal radiographs performed 05/04/2014, and CT of the abdomen and pelvis from 05/24/2014  FINDINGS: The lungs are well-aerated. Mild vascular congestion is noted. There is no evidence of focal opacification, pleural effusion or pneumothorax. The cardiomediastinal silhouette is within normal limits.  Scattered stool and air are seen within the colon; there is no evidence of small bowel dilatation to suggest obstruction. Mildly distended small bowel loops may reflect mild small bowel dysmotility. No free intra-abdominal air is identified on the provided upright view. Clips are noted within the right upper quadrant, reflecting prior cholecystectomy.  No acute osseous abnormalities are seen; the sacroiliac joints are unremarkable in appearance.  IMPRESSION: 1. Small to moderate amount of stool noted in the colon. No free intra-abdominal air seen. Mildly distended small bowel loops may reflect mild small bowel dysmotility. 2. Mild vascular congestion noted; lungs otherwise clear.   Electronically Signed   By: Roanna Raider M.D.   On: 08/02/2014 00:42   1:06 AM Patient feeling better after IV fluids and Reglan. She was advised of x-ray findings. 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 She was advised that additional narcotics are not indicated and she understands this. She has a gastroenterologist in [place] with whom she can follow-up. We will prescribe Reglan, which she has taken  in the past with relief. Because of the risk of tardive dyskinesia long-term treatment with Reglan should be managed by her primary care physician or gastroenterologist.  Paula Libra, MD 08/02/14 [phone]

## 2014-08-01 NOTE — ED Notes (Signed)
Patient transported to X-ray 

## 2014-08-01 NOTE — ED Notes (Signed)
Pt c/o abd pain x 3 days seen here 1 week ago for same

## 2014-08-01 NOTE — ED Notes (Signed)
MD at bedside. 

## 2014-08-02 LAB — ACETAMINOPHEN LEVEL: Acetaminophen (Tylenol), Serum: <10 ug/mL — ABNORMAL LOW (ref 10–30)

## 2014-08-02 MED ORDER — METOCLOPRAMIDE HCL 10 MG PO TABS: ORAL_TABLET | ORAL | Status: DC

## 2014-08-02 NOTE — ED Notes (Addendum)
MD at bedside discussing test results and dispo plan of care, in detail.

## 2014-08-28 ENCOUNTER — Emergency Department (HOSPITAL_BASED_OUTPATIENT_CLINIC_OR_DEPARTMENT_OTHER)
Admission: EM | Admit: 2014-08-28 | Discharge: 2014-08-28 | Disposition: A | Discharge status: Home / Self Care | Payer: Federal, State, Local not specified - PPO | Attending: Emergency Medicine | Admitting: Emergency Medicine

## 2014-08-28 ENCOUNTER — Encounter (HOSPITAL_BASED_OUTPATIENT_CLINIC_OR_DEPARTMENT_OTHER): Payer: Self-pay | Admitting: Emergency Medicine

## 2014-08-28 DIAGNOSIS — Z8679 Personal history of other diseases of the circulatory system: Secondary | ICD-10-CM | POA: Diagnosis not present

## 2014-08-28 DIAGNOSIS — S199XXA Unspecified injury of neck, initial encounter: Secondary | ICD-10-CM | POA: Diagnosis present

## 2014-08-28 DIAGNOSIS — S4991XA Unspecified injury of right shoulder and upper arm, initial encounter: Secondary | ICD-10-CM | POA: Insufficient documentation

## 2014-08-28 DIAGNOSIS — Y998 Other external cause status: Secondary | ICD-10-CM | POA: Diagnosis not present

## 2014-08-28 DIAGNOSIS — G40909 Epilepsy, unspecified, not intractable, without status epilepticus: Secondary | ICD-10-CM | POA: Insufficient documentation

## 2014-08-28 DIAGNOSIS — G43909 Migraine, unspecified, not intractable, without status migrainosus: Secondary | ICD-10-CM | POA: Diagnosis not present

## 2014-08-28 DIAGNOSIS — W06XXXA Fall from bed, initial encounter: Secondary | ICD-10-CM | POA: Insufficient documentation

## 2014-08-28 DIAGNOSIS — G35 Multiple sclerosis: Secondary | ICD-10-CM | POA: Diagnosis not present

## 2014-08-28 DIAGNOSIS — Z862 Personal history of diseases of the blood and blood-forming organs and certain disorders involving the immune mechanism: Secondary | ICD-10-CM | POA: Insufficient documentation

## 2014-08-28 DIAGNOSIS — S0083XA Contusion of other part of head, initial encounter: Secondary | ICD-10-CM | POA: Diagnosis not present

## 2014-08-28 DIAGNOSIS — Y9389 Activity, other specified: Secondary | ICD-10-CM | POA: Insufficient documentation

## 2014-08-28 DIAGNOSIS — Y92092 Bedroom in other non-institutional residence as the place of occurrence of the external cause: Secondary | ICD-10-CM | POA: Diagnosis not present

## 2014-08-28 DIAGNOSIS — Z792 Long term (current) use of antibiotics: Secondary | ICD-10-CM | POA: Diagnosis not present

## 2014-08-28 DIAGNOSIS — Z79899 Other long term (current) drug therapy: Secondary | ICD-10-CM | POA: Insufficient documentation

## 2014-08-28 DIAGNOSIS — S161XXA Strain of muscle, fascia and tendon at neck level, initial encounter: Secondary | ICD-10-CM | POA: Insufficient documentation

## 2014-08-28 MED ORDER — HYDROCODONE-ACETAMINOPHEN 5-325 MG PO TABS: 1.0000 | ORAL_TABLET | Freq: Four times a day (QID) | ORAL | Status: DC | PRN

## 2014-08-28 MED ORDER — ONDANSETRON 4 MG PO TBDP
4.0000 mg | ORAL_TABLET | Freq: Once | ORAL | Status: AC
  Administered 2014-08-28: 4 mg via ORAL
  Filled 2014-08-28: qty 1

## 2014-08-28 NOTE — ED Notes (Signed)
Pt states she fell asleep on the side of her bed this am, fell out of the bed and struck her R temple on the night stand. No laceration or bruising noted. Pt states she tried ice with no relief. Pt is alert, oriented, speaking in complete sentences, with no neuro deficits or abnormalities noted at this time.

## 2014-08-28 NOTE — Discharge Instructions (Signed)
Facial or Scalp Contusion A facial or scalp contusion is a deep bruise on the face or head. Injuries to the face and head generally cause a lot of swelling, especially around the eyes. Contusions are the result of an injury that caused bleeding under the skin. The contusion may turn blue, purple, or yellow. Minor injuries will give you a painless contusion, but more severe contusions may stay painful and swollen for a few weeks.  CAUSES  A facial or scalp contusion is caused by a blunt injury or trauma to the face or head area.  SIGNS AND SYMPTOMS   Swelling of the injured area.   Discoloration of the injured area.   Tenderness, soreness, or pain in the injured area.  DIAGNOSIS  The diagnosis can be made by taking a medical history and doing a physical exam. An X-ray exam, CT scan, or MRI may be needed to determine if there are any associated injuries, such as broken bones (fractures). TREATMENT  Often, the best treatment for a facial or scalp contusion is applying cold compresses to the injured area. Over-the-counter medicines may also be recommended for pain control.  HOME CARE INSTRUCTIONS   Only take over-the-counter or prescription medicines as directed by your health care provider.   Apply ice to the injured area.   Put ice in a plastic bag.   Place a towel between your skin and the bag.   Leave the ice on for 20 minutes, 2-3 times a day.  SEEK MEDICAL CARE IF:  You have bite problems.   You have pain with chewing.   You are concerned about facial defects. SEEK IMMEDIATE MEDICAL CARE IF:  You have severe pain or a headache that is not relieved by medicine.   You have unusual sleepiness, confusion, or personality changes.   You throw up (vomit).   You have a persistent nosebleed.   You have double vision or blurred vision.   You have fluid drainage from your nose or ear.   You have difficulty walking or using your arms or legs.  MAKE SURE YOU:    Understand these instructions.  Will watch your condition.  Will get help right away if you are not doing well or get worse. Document Released: 03/28/2004 Document Revised: 12/09/2012 Document Reviewed: 10/01/2012 [facility] Patient Information 2015 [place], [place]. This information is not intended to replace advice given to you by your health care provider. Make sure you discuss any questions you have with your health care provider.  Cervical Sprain A cervical sprain is an injury in the neck in which the strong, fibrous tissues (ligaments) that connect your neck bones stretch or tear. Cervical sprains can range from mild to severe. Severe cervical sprains can cause the neck vertebrae to be unstable. This can lead to damage of the spinal cord and can result in serious nervous system problems. The amount of time it takes for a cervical sprain to get better depends on the cause and extent of the injury. Most cervical sprains heal in 1 to 3 weeks. CAUSES  Severe cervical sprains may be caused by:   Contact sport injuries (such as from football, rugby, wrestling, hockey, auto racing, gymnastics, diving, martial arts, or boxing).   Motor vehicle collisions.   Whiplash injuries. This is an injury from a sudden forward and backward whipping movement of the head and neck.  Falls.  Mild cervical sprains may be caused by:   Being in an awkward position, such as while cradling a telephone  between your ear and shoulder.   Sitting in a chair that does not offer proper support.   Working at a poorly Marketing executive station.   Looking up or down for long periods of time.  SYMPTOMS   Pain, soreness, stiffness, or a burning sensation in the front, back, or sides of the neck. This discomfort may develop immediately after the injury or slowly, 24 hours or more after the injury.   Pain or tenderness directly in the middle of the back of the neck.   Shoulder or upper back pain.    Limited ability to move the neck.   Headache.   Dizziness.   Weakness, numbness, or tingling in the hands or arms.   Muscle spasms.   Difficulty swallowing or chewing.   Tenderness and swelling of the neck.  DIAGNOSIS  Most of the time your health care provider can diagnose a cervical sprain by taking your history and doing a physical exam. Your health care provider will ask about previous neck injuries and any known neck problems, such as arthritis in the neck. X-rays may be taken to find out if there are any other problems, such as with the bones of the neck. Other tests, such as a CT scan or MRI, may also be needed.  TREATMENT  Treatment depends on the severity of the cervical sprain. Mild sprains can be treated with rest, keeping the neck in place (immobilization), and pain medicines. Severe cervical sprains are immediately immobilized. Further treatment is done to help with pain, muscle spasms, and other symptoms and may include:  Medicines, such as pain relievers, numbing medicines, or muscle relaxants.   Physical therapy. This may involve stretching exercises, strengthening exercises, and posture training. Exercises and improved posture can help stabilize the neck, strengthen muscles, and help stop symptoms from returning.  HOME CARE INSTRUCTIONS   Put ice on the injured area.   Put ice in a plastic bag.   Place a towel between your skin and the bag.   Leave the ice on for 15-20 minutes, 3-4 times a day.   If your injury was severe, you may have been given a cervical collar to wear. A cervical collar is a two-piece collar designed to keep your neck from moving while it heals.  Do not remove the collar unless instructed by your health care provider.  If you have long hair, keep it outside of the collar.  Ask your health care provider before making any adjustments to your collar. Minor adjustments may be required over time to improve comfort and reduce  pressure on your chin or on the back of your head.  Ifyou are allowed to remove the collar for cleaning or bathing, follow your health care provider's instructions on how to do so safely.  Keep your collar clean by wiping it with mild soap and water and drying it completely. If the collar you have been given includes removable pads, remove them every 1-2 days and hand wash them with soap and water. Allow them to air dry. They should be completely dry before you wear them in the collar.  If you are allowed to remove the collar for cleaning and bathing, wash and dry the skin of your neck. Check your skin for irritation or sores. If you see any, tell your health care provider.  Do not drive while wearing the collar.   Only take over-the-counter or prescription medicines for pain, discomfort, or fever as directed by your health care provider.  Keep all follow-up appointments as directed by your health care provider.   Keep all physical therapy appointments as directed by your health care provider.   Make any needed adjustments to your workstation to promote good posture.   Avoid positions and activities that make your symptoms worse.   Warm up and stretch before being active to help prevent problems.  SEEK MEDICAL CARE IF:   Your pain is not controlled with medicine.   You are unable to decrease your pain medicine over time as planned.   Your activity level is not improving as expected.  SEEK IMMEDIATE MEDICAL CARE IF:   You develop any bleeding.  You develop stomach upset.  You have signs of an allergic reaction to your medicine.   Your symptoms get worse.   You develop new, unexplained symptoms.   You have numbness, tingling, weakness, or paralysis in any part of your body.  MAKE SURE YOU:   Understand these instructions.  Will watch your condition.  Will get help right away if you are not doing well or get worse. Document Released: 12/16/2006 Document  Revised: 02/23/2013 Document Reviewed: 08/26/2012 [facility] Patient Information 2015 [place], [place]. This information is not intended to replace advice given to you by your health care provider. Make sure you discuss any questions you have with your health care provider.

## 2014-08-28 NOTE — ED Provider Notes (Signed)
CSN: [id number]     Arrival date & time 08/28/14  1529 History   First MD Initiated Contact with Patient 08/28/14 1653     Chief Complaint  Patient presents with  . Head Injury     Patient is a 44 y.o. female presenting with head injury. The history is provided by the patient. No language interpreter was used.  Head Injury  Ms. Mccomber presents for evaluation of head injury. Today she was sitting in bed listening to the sermon and fell asleep and fell out of the bed striking her head on a nightstand on the floor. She struck the right side of her head and her right shoulder. She experienced immediate pain but went back to bed. She comes in now because she has continued headache, right-sided neck and shoulder soreness as well as nausea. She has a history of MS and has chronic radicular symptoms in bilateral lower extremities. Symptoms are mild and constant. She denies any vomiting.  Past Medical History  Diagnosis Date  . Migraine   . Trigeminal neuralgia   . Molar pregnancy   . Seizures   . PTSD (post-traumatic stress disorder)   . Pseudotumor cerebri   . MS (multiple sclerosis)   . Mitral valve prolapse   . Iron deficiency anemia 10/18/2013   Past Surgical History  Procedure Laterality Date  . Appendectomy    . Breast surgery    . Cholecystectomy    . Gastric bypass    . Tonsillectomy    . Carpal tunnel release    . Vaginal hysterectomy    . Bilateral salpingoophorectomy     Family History  Problem Relation Age of Onset  . Cancer Maternal Aunt     Colon Cancer   . Alcohol abuse Maternal Uncle   . Cancer Paternal Grandfather     Colon Cancer   History  Substance Use Topics  . Smoking status: Never Smoker   . Smokeless tobacco: Never Used  . Alcohol Use: No   OB History    No data available     Review of Systems  All other systems reviewed and are negative.     Allergies  Demerol; Morphine and related; and Nucynta  Home Medications   Prior to Admission  medications   Medication Sig Start Date End Date Taking? Authorizing Provider  acetaZOLAMIDE (DIAMOX) 250 MG tablet Take 500 mg by mouth 2 (two) times daily.     Historical Provider, MD  acyclovir (ZOVIRAX) 800 MG tablet Take 800 mg by mouth 2 (two) times daily.  01/29/13   Historical Provider, MD  amoxicillin (AMOXIL) 500 MG capsule Take 2 capsules (1,000 mg total) by mouth 2 (two) times daily. 05/24/14   Mirian Mo, MD  BIOTIN PO Take 1 tablet by mouth daily.    Historical Provider, MD  Calcium Citrate-Vitamin D 500-500 MG-UNIT CHEW Chew 2 tablets by mouth 2 (two) times daily.    Historical Provider, MD  carisoprodol (SOMA) 350 MG tablet Take 350 mg by mouth 3 (three) times daily as needed for muscle spasms.  12/28/12   Historical Provider, MD  cefpodoxime (VANTIN) 100 MG tablet Take 1 tablet (100 mg total) by mouth 2 (two) times daily. 05/24/14   Mirian Mo, MD  cephALEXin (KEFLEX) 250 MG capsule Take 1 capsule (250 mg total) by mouth 4 (four) times daily. 05/04/14   Loren Racer, MD  cephALEXin (KEFLEX) 500 MG capsule Take 1 capsule (500 mg total) by mouth 3 (three) times daily. 06/19/14  Purvis Sheffield, MD  cetirizine (ZYRTEC) 10 MG tablet Take 10 mg by mouth daily.      Historical Provider, MD  clarithromycin (BIAXIN) 500 MG tablet Take 1 tablet (500 mg total) by mouth 2 (two) times daily. 05/24/14   Mirian Mo, MD  clonazePAM (KLONOPIN) 1 MG tablet Take 0.5 mg by mouth 3 (three) times daily as needed for anxiety.     Historical Provider, MD  dicyclomine (BENTYL) 20 MG tablet Take 1 tablet (20 mg total) by mouth 2 (two) times daily as needed for spasms. 05/04/14   Loren Racer, MD  Dimethyl Fumarate (TECFIDERA) 240 MG CPDR Take 240 mg by mouth 2 (two) times daily.     Historical Provider, MD  diphenhydrAMINE (BENADRYL) 25 mg capsule Take 25 mg by mouth 2 (two) times daily. With Tecfidera.    Historical Provider, MD  estradiol (ESTRACE) 1 MG tablet Take 1 mg by mouth daily.     Historical Provider, MD  gabapentin (NEURONTIN) 600 MG tablet Take 1,200 mg by mouth at bedtime.    Historical Provider, MD  HYDROcodone-acetaminophen (NORCO/VICODIN) 5-325 MG per tablet Take 1 tablet by mouth every 6 (six) hours as needed. 08/28/14   Tilden Fossa, MD  LamoTRIgine XR 200 MG TB24 Take 200 mg by mouth 2 (two) times daily.    Historical Provider, MD  LevETIRAcetam (KEPPRA PO) Take 500 mg by mouth 2 (two) times daily.     Historical Provider, MD  meclizine (ANTIVERT) 25 MG tablet Take 25 mg by mouth 3 (three) times daily as needed for dizziness.  01/25/13   Historical Provider, MD  metoCLOPramide (REGLAN) 10 MG tablet Take 1 tablet every 6 hours as needed for nausea or vomiting. 08/02/14   Paula Libra, MD  Multiple Vitamin (MULTIVITAMIN WITH MINERALS) TABS tablet Take 1 tablet by mouth daily.    Historical Provider, MD  NON FORMULARY Estradiol cream as directed (daily), compounded from [facility]. Dose unknown    Historical Provider, MD  nortriptyline (PAMELOR) 50 MG capsule Take 100 mg by mouth at bedtime.    Historical Provider, MD  omeprazole (PRILOSEC) 20 MG capsule Take 1 capsule (20 mg total) by mouth daily. 05/24/14   Mirian Mo, MD  polyethylene glycol [facility] / Ethe[facility] Hal) packet Take 17 g by mouth daily. 05/04/14   Marissa Sciacca, PA-C  promethazine (PHENERGAN) 25 MG tablet Take 25 mg by mouth every 6 (six) hours as needed for nausea.     Historical Provider, MD  pseudoephedrine (SUDAFED) 30 MG tablet Take 60 mg by mouth at bedtime as needed for congestion.    Historical Provider, MD  SUMAtriptan (IMITREX) 100 MG tablet Take 100 mg by mouth every 2 (two) hours as needed for migraine or headache.     Historical Provider, MD   BP 110/68 mmHg  Pulse 104  Temp(Src) 98.7 F (37.1 C) (Oral)  Resp 18  Ht 5\' 6"  (1.676 m)  Wt 178 lb (80.74 kg)  BMI 28.74 kg/m2  SpO2 100% Physical Exam  Constitutional: She is oriented to person, place, and time. She appears  well-developed and well-nourished.  HENT:  Head: Normocephalic.  Small abrasion over the right cheek with mild local tenderness  Eyes: EOM are normal. Pupils are equal, round, and reactive to light.  Neck: Neck supple.  Right lateral neck tenderness to palpation no midline tenderness over the C-spine. Able to range the neck without difficulty.  Cardiovascular: Normal rate and regular rhythm.   No murmur heard. Pulmonary/Chest: Effort normal  and breath sounds normal. No respiratory distress.  Abdominal: Soft. There is no tenderness. There is no rebound and no guarding.  Musculoskeletal: She exhibits no edema.  Mild tenderness over the right shoulder with full range of motion intact. crosses midline.  Neurological: She is alert and oriented to person, place, and time.  5 out of 5 strength in all 4 extremities, no facial asymmetry.  Skin: Skin is warm and dry.  Psychiatric: She has a normal mood and affect. Her behavior is normal.  Nursing note and vitals reviewed.   ED Course  Procedures (including critical care time) Labs Review Labs Reviewed - No data to display  Imaging Review No results found.   EKG Interpretation None      MDM   Final diagnoses:  Contusion of face, initial encounter  Cervical strain, initial encounter    Patient here for evaluation of head injury. Patient with small abrasion on examination, no need for advanced imaging she has been clinically cleared by a [place] head CT and nexus criteria. The patient reassurance. Discussed home care and return precautions.    Tilden Fossa, MD 08/28/14 1726

## 2014-08-31 ENCOUNTER — Encounter (HOSPITAL_BASED_OUTPATIENT_CLINIC_OR_DEPARTMENT_OTHER): Payer: Self-pay | Admitting: Emergency Medicine

## 2014-08-31 ENCOUNTER — Emergency Department (HOSPITAL_BASED_OUTPATIENT_CLINIC_OR_DEPARTMENT_OTHER): Payer: Federal, State, Local not specified - PPO

## 2014-08-31 ENCOUNTER — Emergency Department (HOSPITAL_BASED_OUTPATIENT_CLINIC_OR_DEPARTMENT_OTHER)
Admission: EM | Admit: 2014-08-31 | Discharge: 2014-08-31 | Disposition: A | Discharge status: Home / Self Care | Payer: Federal, State, Local not specified - PPO | Attending: Emergency Medicine | Admitting: Emergency Medicine

## 2014-08-31 DIAGNOSIS — Z862 Personal history of diseases of the blood and blood-forming organs and certain disorders involving the immune mechanism: Secondary | ICD-10-CM | POA: Insufficient documentation

## 2014-08-31 DIAGNOSIS — Z792 Long term (current) use of antibiotics: Secondary | ICD-10-CM | POA: Insufficient documentation

## 2014-08-31 DIAGNOSIS — G43909 Migraine, unspecified, not intractable, without status migrainosus: Secondary | ICD-10-CM | POA: Insufficient documentation

## 2014-08-31 DIAGNOSIS — F4310 Post-traumatic stress disorder, unspecified: Secondary | ICD-10-CM | POA: Insufficient documentation

## 2014-08-31 DIAGNOSIS — G500 Trigeminal neuralgia: Secondary | ICD-10-CM | POA: Insufficient documentation

## 2014-08-31 DIAGNOSIS — R519 Headache, unspecified: Secondary | ICD-10-CM

## 2014-08-31 DIAGNOSIS — R51 Headache: Secondary | ICD-10-CM | POA: Diagnosis present

## 2014-08-31 DIAGNOSIS — R630 Anorexia: Secondary | ICD-10-CM | POA: Insufficient documentation

## 2014-08-31 DIAGNOSIS — Z8679 Personal history of other diseases of the circulatory system: Secondary | ICD-10-CM | POA: Insufficient documentation

## 2014-08-31 DIAGNOSIS — G35 Multiple sclerosis: Secondary | ICD-10-CM | POA: Diagnosis not present

## 2014-08-31 DIAGNOSIS — Z87828 Personal history of other (healed) physical injury and trauma: Secondary | ICD-10-CM | POA: Diagnosis not present

## 2014-08-31 DIAGNOSIS — Z793 Long term (current) use of hormonal contraceptives: Secondary | ICD-10-CM | POA: Insufficient documentation

## 2014-08-31 DIAGNOSIS — Z79899 Other long term (current) drug therapy: Secondary | ICD-10-CM | POA: Insufficient documentation

## 2014-08-31 DIAGNOSIS — G40909 Epilepsy, unspecified, not intractable, without status epilepticus: Secondary | ICD-10-CM | POA: Insufficient documentation

## 2014-08-31 MED ORDER — DIPHENHYDRAMINE HCL 50 MG/ML IJ SOLN
25.0000 mg | Freq: Once | INTRAMUSCULAR | Status: AC
  Administered 2014-08-31: 25 mg via INTRAMUSCULAR
  Filled 2014-08-31: qty 1

## 2014-08-31 MED ORDER — KETOROLAC TROMETHAMINE 60 MG/2ML IM SOLN
60.0000 mg | Freq: Once | INTRAMUSCULAR | Status: AC
  Administered 2014-08-31: 60 mg via INTRAMUSCULAR
  Filled 2014-08-31: qty 2

## 2014-08-31 MED ORDER — DEXAMETHASONE SODIUM PHOSPHATE 10 MG/ML IJ SOLN
10.0000 mg | Freq: Once | INTRAMUSCULAR | Status: AC
  Administered 2014-08-31: 10 mg via INTRAMUSCULAR
  Filled 2014-08-31: qty 1

## 2014-08-31 MED ORDER — METOCLOPRAMIDE HCL 5 MG/ML IJ SOLN
10.0000 mg | Freq: Once | INTRAMUSCULAR | Status: AC
  Administered 2014-08-31: 10 mg via INTRAMUSCULAR
  Filled 2014-08-31: qty 2

## 2014-08-31 MED ORDER — ONDANSETRON HCL 4 MG/2ML IJ SOLN
8.0000 mg | Freq: Once | INTRAMUSCULAR | Status: AC
  Administered 2014-08-31: 8 mg via INTRAMUSCULAR
  Filled 2014-08-31: qty 4

## 2014-08-31 NOTE — ED Notes (Signed)
Patient reports no dizziness, lightheadedness, or pain while performing orthostatic vital signs.

## 2014-08-31 NOTE — ED Provider Notes (Signed)
CSN: [id number]     Arrival date & time 08/31/14  4098 History  This chart was scribed for Glynn Octave, MD by Leone Payor, ED Scribe. This patient was seen in room MH10/MH10 and the patient's care was started 9:17 AM.    Chief Complaint  Patient presents with  . Headache    The history is provided by the patient. No language interpreter was used.     HPI Comments: ELEXA KIVI is a 44 y.o. female with past medical history of migraines, pseudotumor cerebri, trigeminal neuralgia who presents to the Emergency Department complaining of 3 days of continued HA with associated intermittent dizziness, poor appetite, and nausea. Patient reports falling out of bed and striking her head on a nightstand 3 days ago. She is unsure if she had LOC at that time but she was seen here after the incident. She was seen by her PCP yesterday and was given Toradol and Solumedrol with initial relief. She states this HA is unlike her migraine or high pressure pseudotumor HA's. She denies vomiting, fever, visual disturbances, SOB, CP, abdominal pain, unilateral weakness.   PCP [facility]   Past Medical History  Diagnosis Date  . Migraine   . Trigeminal neuralgia   . Molar pregnancy   . Seizures   . PTSD (post-traumatic stress disorder)   . Pseudotumor cerebri   . MS (multiple sclerosis)   . Mitral valve prolapse   . Iron deficiency anemia 10/18/2013   Past Surgical History  Procedure Laterality Date  . Appendectomy    . Breast surgery    . Cholecystectomy    . Gastric bypass    . Tonsillectomy    . Carpal tunnel release    . Vaginal hysterectomy    . Bilateral salpingoophorectomy     Family History  Problem Relation Age of Onset  . Cancer Maternal Aunt     Colon Cancer   . Alcohol abuse Maternal Uncle   . Cancer Paternal Grandfather     Colon Cancer   History  Substance Use Topics  . Smoking status: Never Smoker   . Smokeless tobacco: Never Used  . Alcohol Use: No   OB History    No data  available     Review of Systems  A complete 10 system review of systems was obtained and all systems are negative except as noted in the HPI and PMH.    Allergies  Demerol; Morphine and related; and Nucynta  Home Medications   Prior to Admission medications   Medication Sig Start Date End Date Taking? Authorizing Provider  acetaZOLAMIDE (DIAMOX) 250 MG tablet Take 500 mg by mouth 2 (two) times daily.     Historical Provider, MD  acyclovir (ZOVIRAX) 800 MG tablet Take 800 mg by mouth 2 (two) times daily.  01/29/13   Historical Provider, MD  amoxicillin (AMOXIL) 500 MG capsule Take 2 capsules (1,000 mg total) by mouth 2 (two) times daily. 05/24/14   Mirian Mo, MD  BIOTIN PO Take 1 tablet by mouth daily.    Historical Provider, MD  Calcium Citrate-Vitamin D 500-500 MG-UNIT CHEW Chew 2 tablets by mouth 2 (two) times daily.    Historical Provider, MD  carisoprodol (SOMA) 350 MG tablet Take 350 mg by mouth 3 (three) times daily as needed for muscle spasms.  12/28/12   Historical Provider, MD  cefpodoxime (VANTIN) 100 MG tablet Take 1 tablet (100 mg total) by mouth 2 (two) times daily. 05/24/14   Mirian Mo, MD  cephALEXin (  KEFLEX) 250 MG capsule Take 1 capsule (250 mg total) by mouth 4 (four) times daily. 05/04/14   Loren Racer, MD  cephALEXin (KEFLEX) 500 MG capsule Take 1 capsule (500 mg total) by mouth 3 (three) times daily. 06/19/14   Purvis Sheffield, MD  cetirizine (ZYRTEC) 10 MG tablet Take 10 mg by mouth daily.      Historical Provider, MD  clarithromycin (BIAXIN) 500 MG tablet Take 1 tablet (500 mg total) by mouth 2 (two) times daily. 05/24/14   Mirian Mo, MD  clonazePAM (KLONOPIN) 1 MG tablet Take 0.5 mg by mouth 3 (three) times daily as needed for anxiety.     Historical Provider, MD  dicyclomine (BENTYL) 20 MG tablet Take 1 tablet (20 mg total) by mouth 2 (two) times daily as needed for spasms. 05/04/14   Loren Racer, MD  Dimethyl Fumarate (TECFIDERA) 240 MG CPDR Take  240 mg by mouth 2 (two) times daily.     Historical Provider, MD  diphenhydrAMINE (BENADRYL) 25 mg capsule Take 25 mg by mouth 2 (two) times daily. With Tecfidera.    Historical Provider, MD  estradiol (ESTRACE) 1 MG tablet Take 1 mg by mouth daily.    Historical Provider, MD  gabapentin (NEURONTIN) 600 MG tablet Take 1,200 mg by mouth at bedtime.    Historical Provider, MD  HYDROcodone-acetaminophen (NORCO/VICODIN) 5-325 MG per tablet Take 1 tablet by mouth every 6 (six) hours as needed. 08/28/14   Tilden Fossa, MD  LamoTRIgine XR 200 MG TB24 Take 200 mg by mouth 2 (two) times daily.    Historical Provider, MD  LevETIRAcetam (KEPPRA PO) Take 500 mg by mouth 2 (two) times daily.     Historical Provider, MD  meclizine (ANTIVERT) 25 MG tablet Take 25 mg by mouth 3 (three) times daily as needed for dizziness.  01/25/13   Historical Provider, MD  metoCLOPramide (REGLAN) 10 MG tablet Take 1 tablet every 6 hours as needed for nausea or vomiting. 08/02/14   Paula Libra, MD  Multiple Vitamin (MULTIVITAMIN WITH MINERALS) TABS tablet Take 1 tablet by mouth daily.    Historical Provider, MD  NON FORMULARY Estradiol cream as directed (daily), compounded from [facility]. Dose unknown    Historical Provider, MD  nortriptyline (PAMELOR) 50 MG capsule Take 100 mg by mouth at bedtime.    Historical Provider, MD  omeprazole (PRILOSEC) 20 MG capsule Take 1 capsule (20 mg total) by mouth daily. 05/24/14   Mirian Mo, MD  polyethylene glycol [facility] / Ethelene Hal) packet Take 17 g by mouth daily. 05/04/14   Marissa Sciacca, PA-C  promethazine (PHENERGAN) 25 MG tablet Take 25 mg by mouth every 6 (six) hours as needed for nausea.     Historical Provider, MD  pseudoephedrine (SUDAFED) 30 MG tablet Take 60 mg by mouth at bedtime as needed for congestion.    Historical Provider, MD  SUMAtriptan (IMITREX) 100 MG tablet Take 100 mg by mouth every 2 (two) hours as needed for migraine or headache.     Historical Provider, MD    BP 92/66 mmHg  Pulse 93  Temp(Src) 98.5 F (36.9 C) (Oral)  Resp 18  Ht 5\' 6"  (1.676 m)  Wt 178 lb (80.74 kg)  BMI 28.74 kg/m2  SpO2 100% Physical Exam  Constitutional: She is oriented to person, place, and time. She appears well-developed and well-nourished. No distress.  No distress  HENT:  Head: Normocephalic and atraumatic.  Mouth/Throat: Oropharynx is clear and moist. No oropharyngeal exudate.  Eyes: Conjunctivae  and EOM are normal. Pupils are equal, round, and reactive to light.  No papilledema on fundoscopic exam.   Neck: Normal range of motion. Neck supple.  No meningismus.  Cardiovascular: Normal rate, regular rhythm, normal heart sounds and intact distal pulses.   No murmur heard. Pulmonary/Chest: Effort normal and breath sounds normal. No respiratory distress.  Abdominal: Soft. There is no tenderness. There is no rebound and no guarding.  Musculoskeletal: Normal range of motion. She exhibits no edema or tenderness.  Neurological: She is alert and oriented to person, place, and time. No cranial nerve deficit. She exhibits normal muscle tone. Coordination normal.  No ataxia on finger to nose bilaterally. No pronator drift. 5/5 strength throughout. CN 2-12 intact. Negative Romberg. Equal grip strength. Sensation intact. Gait is normal.   Skin: Skin is warm.  Psychiatric: She has a normal mood and affect. Her behavior is normal.  Nursing note and vitals reviewed.   ED Course  Procedures (including critical care time)  DIAGNOSTIC STUDIES: Oxygen Saturation is 100% on RA, normal by my interpretation.    COORDINATION OF CARE: 9:30 AM Will order head CT. Discussed treatment plan with pt at bedside and pt agreed to plan.   Labs Review Labs Reviewed - No data to display  Imaging Review Ct Head Wo Contrast  08/31/2014   CLINICAL DATA:  Larey Seat from the bad with trauma to the head and face striking furniture on 08/28/2014. Headache and dizziness.  EXAM: CT HEAD WITHOUT  CONTRAST  TECHNIQUE: Contiguous axial images were obtained from the base of the skull through the vertex without intravenous contrast.  COMPARISON:  06/13/2014  FINDINGS: The brain has a normal appearance without evidence of atrophy, infarction, mass lesion, hemorrhage, hydrocephalus or extra-axial collection. The calvarium is unremarkable. The paranasal sinuses, middle ears and mastoids are clear.  IMPRESSION: Normal head CT   Electronically Signed   By: Paulina Fusi M.D.   On: 08/31/2014 09:49     EKG Interpretation None      MDM   Final diagnoses:  Headache, unspecified headache type   Patient struck head 3 nights ago.  Unknown LOC. No vomiting.  Saw PCP yesterday and given solu-medrol and toradol with improvement. No fever, no weakness, numbness, tingling.     patient has history of MS as well as pseudotumor cerebri. She denies this feeling her migraine headache. She denies that this feels high-pressure headache. She's had no visual changes. No papilledema on exam   CT head is normal. Patient feels improved after treatment in the ED. She is tolerating by mouth and ambulatory. Low suspicion for meningitis, subarachnoid hemorrhage, temporal arteritis. Headache is also not consistent with her pseudotumor cerebri. She has no papilledema or vision changes. Suspect mild concussion from head injury several days ago. Instructed to refrain from any contact sports and rest and prevent further injury until cleared to return by her PCP. Follow-up with her neurologist. Return precautions discussed.   I personally performed the services described in this documentation, which was scribed in my presence. The recorded information has been reviewed and is accurate.   Glynn Octave, MD 08/31/14 1134

## 2014-08-31 NOTE — ED Notes (Signed)
Pt tolerating PO fluids and ambulating WNL

## 2014-08-31 NOTE — Discharge Instructions (Signed)
General Headache Without Cause Take your medications as prescribed. Followup with your neurologist. Return to the ED if you develop new or worsening symptoms. A headache is pain or discomfort felt around the head or neck area. The specific cause of a headache may not be found. There are many causes and types of headaches. A few common ones are:  Tension headaches.  Migraine headaches.  Cluster headaches.  Chronic daily headaches. HOME CARE INSTRUCTIONS   Keep all follow-up appointments with your caregiver or any specialist referral.  Only take over-the-counter or prescription medicines for pain or discomfort as directed by your caregiver.  Lie down in a dark, quiet room when you have a headache.  Keep a headache journal to find out what may trigger your migraine headaches. For example, write down:  What you eat and drink.  How much sleep you get.  Any change to your diet or medicines.  Try massage or other relaxation techniques.  Put ice packs or heat on the head and neck. Use these 3 to 4 times per day for 15 to 20 minutes each time, or as needed.  Limit stress.  Sit up straight, and do not tense your muscles.  Quit smoking if you smoke.  Limit alcohol use.  Decrease the amount of caffeine you drink, or stop drinking caffeine.  Eat and sleep on a regular schedule.  Get 7 to 9 hours of sleep, or as recommended by your caregiver.  Keep lights dim if bright lights bother you and make your headaches worse. SEEK MEDICAL CARE IF:   You have problems with the medicines you were prescribed.  Your medicines are not working.  You have a change from the usual headache.  You have nausea or vomiting. SEEK IMMEDIATE MEDICAL CARE IF:   Your headache becomes severe.  You have a fever.  You have a stiff neck.  You have loss of vision.  You have muscular weakness or loss of muscle control.  You start losing your balance or have trouble walking.  You feel faint or  pass out.  You have severe symptoms that are different from your first symptoms. MAKE SURE YOU:   Understand these instructions.  Will watch your condition.  Will get help right away if you are not doing well or get worse. Document Released: 02/18/2005 Document Revised: 05/13/2011 Document Reviewed: 03/06/2011 [facility] Patient Information 2015 [place], [place]. This information is not intended to replace advice given to you by your health care provider. Make sure you discuss any questions you have with your health care provider.

## 2014-08-31 NOTE — ED Notes (Signed)
Pt reports continued headache and nausea with rapid position changes since striking her head on Sunday.

## 2014-10-20 ENCOUNTER — Emergency Department (HOSPITAL_COMMUNITY)
Admission: EM | Admit: 2014-10-20 | Discharge: 2014-10-20 | Disposition: A | Discharge status: Home / Self Care | Payer: Federal, State, Local not specified - PPO | Attending: Emergency Medicine | Admitting: Emergency Medicine

## 2014-10-20 ENCOUNTER — Emergency Department (HOSPITAL_COMMUNITY): Payer: Federal, State, Local not specified - PPO

## 2014-10-20 ENCOUNTER — Encounter (HOSPITAL_COMMUNITY): Payer: Self-pay

## 2014-10-20 DIAGNOSIS — Z9884 Bariatric surgery status: Secondary | ICD-10-CM | POA: Insufficient documentation

## 2014-10-20 DIAGNOSIS — Z792 Long term (current) use of antibiotics: Secondary | ICD-10-CM | POA: Insufficient documentation

## 2014-10-20 DIAGNOSIS — R000 Tachycardia, unspecified: Secondary | ICD-10-CM | POA: Diagnosis not present

## 2014-10-20 DIAGNOSIS — G40909 Epilepsy, unspecified, not intractable, without status epilepticus: Secondary | ICD-10-CM | POA: Diagnosis not present

## 2014-10-20 DIAGNOSIS — G43909 Migraine, unspecified, not intractable, without status migrainosus: Secondary | ICD-10-CM | POA: Diagnosis not present

## 2014-10-20 DIAGNOSIS — Z8679 Personal history of other diseases of the circulatory system: Secondary | ICD-10-CM | POA: Insufficient documentation

## 2014-10-20 DIAGNOSIS — Z862 Personal history of diseases of the blood and blood-forming organs and certain disorders involving the immune mechanism: Secondary | ICD-10-CM | POA: Diagnosis not present

## 2014-10-20 DIAGNOSIS — K5909 Other constipation: Secondary | ICD-10-CM | POA: Diagnosis not present

## 2014-10-20 DIAGNOSIS — Z9049 Acquired absence of other specified parts of digestive tract: Secondary | ICD-10-CM | POA: Insufficient documentation

## 2014-10-20 DIAGNOSIS — Z90710 Acquired absence of both cervix and uterus: Secondary | ICD-10-CM | POA: Diagnosis not present

## 2014-10-20 DIAGNOSIS — Z79899 Other long term (current) drug therapy: Secondary | ICD-10-CM | POA: Diagnosis not present

## 2014-10-20 DIAGNOSIS — Z79818 Long term (current) use of other agents affecting estrogen receptors and estrogen levels: Secondary | ICD-10-CM | POA: Insufficient documentation

## 2014-10-20 DIAGNOSIS — R1084 Generalized abdominal pain: Secondary | ICD-10-CM | POA: Diagnosis present

## 2014-10-20 DIAGNOSIS — F4310 Post-traumatic stress disorder, unspecified: Secondary | ICD-10-CM | POA: Insufficient documentation

## 2014-10-20 LAB — COMPREHENSIVE METABOLIC PANEL
ALBUMIN: 4.2 g/dL (ref 3.5–5.0)
ALK PHOS: 70 U/L (ref 38–126)
ALT: 28 U/L (ref 14–54)
ANION GAP: 9 (ref 5–15)
AST: 31 U/L (ref 15–41)
BILIRUBIN TOTAL: 0.2 mg/dL — AB (ref 0.3–1.2)
BUN: 16 mg/dL (ref 6–20)
CO2: 33 mmol/L — AB (ref 22–32)
Calcium: 9.1 mg/dL (ref 8.9–10.3)
Chloride: 100 mmol/L — ABNORMAL LOW (ref 101–111)
Creatinine, Ser: 0.89 mg/dL (ref 0.44–1.00)
GFR calc Af Amer: >60 mL/min (ref >60)
GFR calc non Af Amer: >60 mL/min (ref >60)
Glucose, Bld: 110 mg/dL — ABNORMAL HIGH (ref 65–99)
POTASSIUM: 4.2 mmol/L (ref 3.5–5.1)
SODIUM: 142 mmol/L (ref 135–145)
TOTAL PROTEIN: 7.3 g/dL (ref 6.5–8.1)

## 2014-10-20 LAB — URINALYSIS, ROUTINE W REFLEX MICROSCOPIC
BILIRUBIN URINE: NEGATIVE
GLUCOSE, UA: NEGATIVE mg/dL
HGB URINE DIPSTICK: NEGATIVE
Ketones, ur: NEGATIVE mg/dL
Leukocytes, UA: NEGATIVE
Nitrite: NEGATIVE
Protein, ur: NEGATIVE mg/dL
SPECIFIC GRAVITY, URINE: 1.011 (ref 1.005–1.030)
UROBILINOGEN UA: 0.2 mg/dL (ref 0.0–1.0)
pH: 7.5 (ref 5.0–8.0)

## 2014-10-20 LAB — CBC
HCT: 41.4 % (ref 36.0–46.0)
HEMOGLOBIN: 12.9 g/dL (ref 12.0–15.0)
MCH: 29.8 pg (ref 26.0–34.0)
MCHC: 31.2 g/dL (ref 30.0–36.0)
MCV: 95.6 fL (ref 78.0–100.0)
Platelets: 224 10*3/uL (ref 150–400)
RBC: 4.33 MIL/uL (ref 3.87–5.11)
RDW: 14.4 % (ref 11.5–15.5)
WBC: 8.5 10*3/uL (ref 4.0–10.5)

## 2014-10-20 LAB — POC OCCULT BLOOD, ED: Fecal Occult Bld: NEGATIVE

## 2014-10-20 LAB — LIPASE, BLOOD: Lipase: 21 U/L — ABNORMAL LOW (ref 22–51)

## 2014-10-20 MED ORDER — DIPHENHYDRAMINE HCL 50 MG/ML IJ SOLN
25.0000 mg | Freq: Once | INTRAMUSCULAR | Status: AC
  Administered 2014-10-20: 25 mg via INTRAVENOUS
  Filled 2014-10-20: qty 1

## 2014-10-20 MED ORDER — HYDROMORPHONE HCL 1 MG/ML IJ SOLN
1.0000 mg | Freq: Once | INTRAMUSCULAR | Status: AC
  Administered 2014-10-20: 1 mg via INTRAVENOUS
  Filled 2014-10-20: qty 1

## 2014-10-20 MED ORDER — IOHEXOL 300 MG/ML  SOLN
100.0000 mL | Freq: Once | INTRAMUSCULAR | Status: AC | PRN
  Administered 2014-10-20: 100 mL via INTRAVENOUS

## 2014-10-20 MED ORDER — SODIUM CHLORIDE 0.9 % IV SOLN
INTRAVENOUS | Status: DC
  Administered 2014-10-20: 13:00:00 via INTRAVENOUS

## 2014-10-20 MED ORDER — IOHEXOL 300 MG/ML  SOLN
50.0000 mL | Freq: Once | INTRAMUSCULAR | Status: AC | PRN
  Administered 2014-10-20: 50 mL via ORAL

## 2014-10-20 MED ORDER — ONDANSETRON HCL 4 MG/2ML IJ SOLN
4.0000 mg | Freq: Once | INTRAMUSCULAR | Status: AC
  Administered 2014-10-20: 4 mg via INTRAVENOUS
  Filled 2014-10-20: qty 2

## 2014-10-20 NOTE — ED Notes (Signed)
Pt alert and oriented x4. Respirations even and unlabored, bilateral symmetrical rise and fall of chest. Skin warm and dry. In no acute distress. Denies needs.   

## 2014-10-20 NOTE — Discharge Instructions (Signed)

## 2014-10-20 NOTE — ED Notes (Signed)
Pt c/o generalized abdominal pain/distention, constipation, and nausea x 1 week.  Pain score 9/10.  Pt reports taking Vicodin for pain w/o relief.  Pt has an extensive abdominal Hx.

## 2014-10-20 NOTE — ED Provider Notes (Signed)
CSN: [id number]     Arrival date & time 10/20/14  1030 History   First MD Initiated Contact with Patient 10/20/14 1132     Chief Complaint  Patient presents with  . Abdominal Pain  . Constipation  . Nausea     (Consider location/radiation/quality/duration/timing/severity/associated sxs/prior Treatment) HPI Comments: Patient here complaining of one week of generalized abdominal pain and distention. Patient uses hydrocodone on a regular basis but also take stool softeners and did pass a large stool today which she said it was hard. She said nausea but no vomiting. No fever or chills. No diarrhea. History of multiple abdominal surgeries including cholecystectomy, appendectomy, hysterectomy. Does have history of adhesions in the past 2. She's also had gastric bypass surgery and has also been diagnosed with having a bowel obstruction and is concerned that is what she has currently. Symptoms persistent and getting worse and nothing makes them better. No treatment used prior to arrival  Patient is a 44 y.o. female presenting with abdominal pain and constipation. The history is provided by the patient.  Abdominal Pain Associated symptoms: constipation   Constipation Associated symptoms: abdominal pain     Past Medical History  Diagnosis Date  . Migraine   . Trigeminal neuralgia   . Molar pregnancy   . Seizures   . PTSD (post-traumatic stress disorder)   . Pseudotumor cerebri   . MS (multiple sclerosis)   . Mitral valve prolapse   . Iron deficiency anemia 10/18/2013   Past Surgical History  Procedure Laterality Date  . Appendectomy    . Breast surgery    . Cholecystectomy    . Gastric bypass    . Tonsillectomy    . Carpal tunnel release    . Vaginal hysterectomy    . Bilateral salpingoophorectomy     Family History  Problem Relation Age of Onset  . Cancer Maternal Aunt     Colon Cancer   . Alcohol abuse Maternal Uncle   . Cancer Paternal Grandfather     Colon Cancer   Social  History  Substance Use Topics  . Smoking status: Never Smoker   . Smokeless tobacco: Never Used  . Alcohol Use: Yes   OB History    No data available     Review of Systems  Gastrointestinal: Positive for abdominal pain and constipation.  All other systems reviewed and are negative.     Allergies  Demerol; Morphine and related; and Nucynta  Home Medications   Prior to Admission medications   Medication Sig Start Date End Date Taking? Authorizing Provider  acetaZOLAMIDE (DIAMOX) 250 MG tablet Take 500 mg by mouth 2 (two) times daily.     Historical Provider, MD  acyclovir (ZOVIRAX) 800 MG tablet Take 800 mg by mouth 2 (two) times daily.  01/29/13   Historical Provider, MD  amoxicillin (AMOXIL) 500 MG capsule Take 2 capsules (1,000 mg total) by mouth 2 (two) times daily. 05/24/14   Mirian Mo, MD  BIOTIN PO Take 1 tablet by mouth daily.    Historical Provider, MD  Calcium Citrate-Vitamin D 500-500 MG-UNIT CHEW Chew 2 tablets by mouth 2 (two) times daily.    Historical Provider, MD  carisoprodol (SOMA) 350 MG tablet Take 350 mg by mouth 3 (three) times daily as needed for muscle spasms.  12/28/12   Historical Provider, MD  cefpodoxime (VANTIN) 100 MG tablet Take 1 tablet (100 mg total) by mouth 2 (two) times daily. 05/24/14   Mirian Mo, MD  cephALEXin [facility]) 250  MG capsule Take 1 capsule (250 mg total) by mouth 4 (four) times daily. 05/04/14   Loren Racer, MD  cephALEXin (KEFLEX) 500 MG capsule Take 1 capsule (500 mg total) by mouth 3 (three) times daily. 06/19/14   Purvis Sheffield, MD  cetirizine (ZYRTEC) 10 MG tablet Take 10 mg by mouth daily.      Historical Provider, MD  clarithromycin (BIAXIN) 500 MG tablet Take 1 tablet (500 mg total) by mouth 2 (two) times daily. 05/24/14   Mirian Mo, MD  clonazePAM (KLONOPIN) 1 MG tablet Take 0.5 mg by mouth 3 (three) times daily as needed for anxiety.     Historical Provider, MD  dicyclomine (BENTYL) 20 MG tablet Take 1 tablet  (20 mg total) by mouth 2 (two) times daily as needed for spasms. 05/04/14   Loren Racer, MD  Dimethyl Fumarate (TECFIDERA) 240 MG CPDR Take 240 mg by mouth 2 (two) times daily.     Historical Provider, MD  diphenhydrAMINE (BENADRYL) 25 mg capsule Take 25 mg by mouth 2 (two) times daily. With Tecfidera.    Historical Provider, MD  estradiol (ESTRACE) 1 MG tablet Take 1 mg by mouth daily.    Historical Provider, MD  gabapentin (NEURONTIN) 600 MG tablet Take 1,200 mg by mouth at bedtime.    Historical Provider, MD  HYDROcodone-acetaminophen (NORCO/VICODIN) 5-325 MG per tablet Take 1 tablet by mouth every 6 (six) hours as needed. 08/28/14   Tilden Fossa, MD  LamoTRIgine XR 200 MG TB24 Take 200 mg by mouth 2 (two) times daily.    Historical Provider, MD  LevETIRAcetam (KEPPRA PO) Take 500 mg by mouth 2 (two) times daily.     Historical Provider, MD  meclizine (ANTIVERT) 25 MG tablet Take 25 mg by mouth 3 (three) times daily as needed for dizziness.  01/25/13   Historical Provider, MD  metoCLOPramide (REGLAN) 10 MG tablet Take 1 tablet every 6 hours as needed for nausea or vomiting. 08/02/14   Paula Libra, MD  Multiple Vitamin (MULTIVITAMIN WITH MINERALS) TABS tablet Take 1 tablet by mouth daily.    Historical Provider, MD  NON FORMULARY Estradiol cream as directed (daily), compounded from [facility]. Dose unknown    Historical Provider, MD  nortriptyline (PAMELOR) 50 MG capsule Take 100 mg by mouth at bedtime.    Historical Provider, MD  omeprazole (PRILOSEC) 20 MG capsule Take 1 capsule (20 mg total) by mouth daily. 05/24/14   Mirian Mo, MD  polyethylene glycol [facility]) packet Take 17 g by mouth daily. 05/04/14   Marissa Sciacca, PA-C  promethazine (PHENERGAN) 25 MG tablet Take 25 mg by mouth every 6 (six) hours as needed for nausea.     Historical Provider, MD  pseudoephedrine (SUDAFED) 30 MG tablet Take 60 mg by mouth at bedtime as needed for congestion.    Historical Provider, MD   SUMAtriptan (IMITREX) 100 MG tablet Take 100 mg by mouth every 2 (two) hours as needed for migraine or headache.     Historical Provider, MD   BP 112/59 mmHg  Pulse 106  Temp(Src) 98.1 F (36.7 C) (Oral)  Resp 16  SpO2 100% Physical Exam  Constitutional: She is oriented to person, place, and time. She appears well-developed and well-nourished.  Non-toxic appearance. No distress.  HENT:  Head: Normocephalic and atraumatic.  Eyes: Conjunctivae, EOM and lids are normal. Pupils are equal, round, and reactive to light.  Neck: Normal range of motion. Neck supple. No tracheal deviation present. No thyroid mass  present.  Cardiovascular: Regular rhythm and normal heart sounds.  Tachycardia present.  Exam reveals no gallop.   No murmur heard. Pulmonary/Chest: Effort normal and breath sounds normal. No stridor. No respiratory distress. She has no decreased breath sounds. She has no wheezes. She has no rhonchi. She has no rales.  Abdominal: Soft. Normal appearance and bowel sounds are normal. She exhibits no distension. There is generalized tenderness. There is no rigidity, no rebound, no guarding and no CVA tenderness.  Musculoskeletal: Normal range of motion. She exhibits no edema or tenderness.  Neurological: She is alert and oriented to person, place, and time. She has normal strength. No cranial nerve deficit or sensory deficit. GCS eye subscore is 4. GCS verbal subscore is 5. GCS motor subscore is 6.  Skin: Skin is warm and dry. No abrasion and no rash noted.  Psychiatric: She has a normal mood and affect. Her speech is normal and behavior is normal.  Nursing note and vitals reviewed.   ED Course  Procedures (including critical care time) Labs Review Labs Reviewed  CBC  LIPASE, BLOOD  COMPREHENSIVE METABOLIC PANEL  URINALYSIS, ROUTINE W REFLEX MICROSCOPIC (NOT AT [facility])    Imaging Review No results found. I have personally reviewed and evaluated these images and lab results as part of  my medical decision-making.   EKG Interpretation None      MDM   Final diagnoses:  None    Patient given pain medicine IV fluids here. CT consistent with constipation. Patient will follow-up with her Dr.    Lorre Nick, MD 10/20/14 1450

## 2014-10-20 NOTE — ED Notes (Signed)
Pt escorted to discharge window. Pt verbalized understanding discharge instructions. In no acute distress.  

## 2014-12-07 ENCOUNTER — Emergency Department (HOSPITAL_BASED_OUTPATIENT_CLINIC_OR_DEPARTMENT_OTHER)
Admission: EM | Admit: 2014-12-07 | Discharge: 2014-12-08 | Disposition: A | Discharge status: Home / Self Care | Payer: Federal, State, Local not specified - PPO | Attending: Emergency Medicine | Admitting: Emergency Medicine

## 2014-12-07 ENCOUNTER — Encounter (HOSPITAL_BASED_OUTPATIENT_CLINIC_OR_DEPARTMENT_OTHER): Payer: Self-pay

## 2014-12-07 DIAGNOSIS — R531 Weakness: Secondary | ICD-10-CM | POA: Diagnosis not present

## 2014-12-07 DIAGNOSIS — M542 Cervicalgia: Secondary | ICD-10-CM | POA: Insufficient documentation

## 2014-12-07 DIAGNOSIS — G500 Trigeminal neuralgia: Secondary | ICD-10-CM | POA: Diagnosis not present

## 2014-12-07 DIAGNOSIS — Z862 Personal history of diseases of the blood and blood-forming organs and certain disorders involving the immune mechanism: Secondary | ICD-10-CM | POA: Insufficient documentation

## 2014-12-07 DIAGNOSIS — R197 Diarrhea, unspecified: Secondary | ICD-10-CM | POA: Diagnosis not present

## 2014-12-07 DIAGNOSIS — G40909 Epilepsy, unspecified, not intractable, without status epilepticus: Secondary | ICD-10-CM | POA: Insufficient documentation

## 2014-12-07 DIAGNOSIS — Z8659 Personal history of other mental and behavioral disorders: Secondary | ICD-10-CM | POA: Insufficient documentation

## 2014-12-07 DIAGNOSIS — Z79899 Other long term (current) drug therapy: Secondary | ICD-10-CM | POA: Diagnosis not present

## 2014-12-07 DIAGNOSIS — G43909 Migraine, unspecified, not intractable, without status migrainosus: Secondary | ICD-10-CM

## 2014-12-07 MED ORDER — DIPHENHYDRAMINE HCL 50 MG/ML IJ SOLN
25.0000 mg | Freq: Once | INTRAMUSCULAR | Status: AC
  Administered 2014-12-07: 25 mg via INTRAVENOUS
  Filled 2014-12-07: qty 1

## 2014-12-07 MED ORDER — ONDANSETRON HCL 4 MG/2ML IJ SOLN
4.0000 mg | Freq: Once | INTRAMUSCULAR | Status: AC | PRN
  Administered 2014-12-07: 4 mg via INTRAVENOUS
  Filled 2014-12-07: qty 2

## 2014-12-07 MED ORDER — PROCHLORPERAZINE EDISYLATE 5 MG/ML IJ SOLN
10.0000 mg | Freq: Four times a day (QID) | INTRAMUSCULAR | Status: DC | PRN
  Administered 2014-12-07: 10 mg via INTRAVENOUS
  Filled 2014-12-07: qty 2

## 2014-12-07 MED ORDER — KETOROLAC TROMETHAMINE 30 MG/ML IJ SOLN
30.0000 mg | Freq: Once | INTRAMUSCULAR | Status: AC
  Administered 2014-12-07: 30 mg via INTRAVENOUS
  Filled 2014-12-07: qty 1

## 2014-12-07 MED ORDER — SODIUM CHLORIDE 0.9 % IV BOLUS (SEPSIS)
1000.0000 mL | Freq: Once | INTRAVENOUS | Status: AC
  Administered 2014-12-07: 1000 mL via INTRAVENOUS

## 2014-12-07 MED ORDER — DEXAMETHASONE SODIUM PHOSPHATE 10 MG/ML IJ SOLN
10.0000 mg | Freq: Once | INTRAMUSCULAR | Status: AC
  Administered 2014-12-07: 10 mg via INTRAVENOUS
  Filled 2014-12-07: qty 1

## 2014-12-07 NOTE — ED Notes (Signed)
MD at bedside. 

## 2014-12-07 NOTE — ED Notes (Signed)
Pt c/o migraine type headache that started this morning with associated n/v and photosensitivity.  States she took her phenergan and imitrex and had some relief, but it came back.

## 2014-12-07 NOTE — ED Provider Notes (Signed)
CSN: [id number]     Arrival date & time 12/07/14  2204 History  By signing my name below, I, Tanda Rockers, attest that this documentation has been prepared under the direction and in the presence of Shon Baton, MD. Electronically Signed: Tanda Rockers, ED Scribe. 12/07/2014. 11:21 PM.  Chief Complaint  Patient presents with  . Migraine   The history is provided by the patient. No language interpreter was used.     HPI Comments: DJUANA LITTLETON is a 44 y.o. female with hx migraines who presents to the Emergency Department complaining of migraine that began this morning. She also complains of photophobia, nausea, neck pain, and diarrhea. Pt has taken Phenergan and Imitrex with mild relief but states her symptoms have returned. She notes that her symptoms are usual for her migraines. Rates pain at 10 out of 10. It is occipital and nonradiating. Denies neck stiffness, vomiting, numbness, tingling, or any other changes.  Pt notes weakness in bilateral lower extremities from her MS, unchanges.   Past Medical History  Diagnosis Date  . Migraine   . Trigeminal neuralgia   . Molar pregnancy   . PTSD (post-traumatic stress disorder)   . Pseudotumor cerebri   . MS (multiple sclerosis) (HCC)   . Mitral valve prolapse   . Iron deficiency anemia 10/18/2013  . Seizures (HCC)     10/2014 last seizure   Past Surgical History  Procedure Laterality Date  . Appendectomy    . Breast surgery    . Cholecystectomy    . Gastric bypass    . Tonsillectomy    . Carpal tunnel release    . Vaginal hysterectomy    . Bilateral salpingoophorectomy     Family History  Problem Relation Age of Onset  . Cancer Maternal Aunt     Colon Cancer   . Alcohol abuse Maternal Uncle   . Cancer Paternal Grandfather     Colon Cancer   Social History  Substance Use Topics  . Smoking status: Never Smoker   . Smokeless tobacco: Never Used  . Alcohol Use: Yes   OB History    No data available     Review of  Systems  Constitutional: Negative for fever.  Eyes: Positive for photophobia. Negative for visual disturbance.  Respiratory: Negative for chest tightness and shortness of breath.   Cardiovascular: Negative for chest pain.  Gastrointestinal: Negative for nausea, vomiting and abdominal pain.  Genitourinary: Negative for dysuria.  Musculoskeletal: Negative for neck stiffness.  Neurological: Positive for weakness and headaches.  Psychiatric/Behavioral: Negative for confusion.  All other systems reviewed and are negative.     Allergies  Demerol; Morphine and related; Nucynta; Percocet; and Vicodin  Home Medications   Prior to Admission medications   Medication Sig Start Date End Date Taking? Authorizing Provider  acyclovir (ZOVIRAX) 800 MG tablet Take 800 mg by mouth 2 (two) times daily.  01/29/13   Historical Provider, MD  amoxicillin (AMOXIL) 500 MG capsule Take 2 capsules (1,000 mg total) by mouth 2 (two) times daily. Patient not taking: Reported on 10/20/2014 05/24/14   Mirian Mo, MD  b complex vitamins capsule Take 2 capsules by mouth daily.    Historical Provider, MD  BIOTIN PO Take 1 tablet by mouth daily.    Historical Provider, MD  bumetanide (BUMEX) 1 MG tablet Take 2 mg by mouth 2 (two) times daily.    Historical Provider, MD  Calcium Citrate-Vitamin D 500-500 MG-UNIT CHEW Chew 2 tablets by mouth at  bedtime.     Historical Provider, MD  cefpodoxime (VANTIN) 100 MG tablet Take 1 tablet (100 mg total) by mouth 2 (two) times daily. Patient not taking: Reported on 10/20/2014 05/24/14   Mirian Mo, MD  cephALEXin (KEFLEX) 250 MG capsule Take 1 capsule (250 mg total) by mouth 4 (four) times daily. Patient not taking: Reported on 10/20/2014 05/04/14   Loren Racer, MD  cephALEXin (KEFLEX) 500 MG capsule Take 1 capsule (500 mg total) by mouth 3 (three) times daily. Patient not taking: Reported on 10/20/2014 06/19/14   Purvis Sheffield, MD  cetirizine (ZYRTEC) 10 MG tablet Take 10  mg by mouth daily.      Historical Provider, MD  clarithromycin (BIAXIN) 500 MG tablet Take 1 tablet (500 mg total) by mouth 2 (two) times daily. Patient not taking: Reported on 10/20/2014 05/24/14   Mirian Mo, MD  clonazePAM (KLONOPIN) 0.5 MG tablet Take 0.5 mg by mouth 3 (three) times daily.    Historical Provider, MD  cyclobenzaprine (FLEXERIL) 10 MG tablet Take 10 mg by mouth 2 (two) times daily.    Historical Provider, MD  dicyclomine (BENTYL) 10 MG capsule Take 20 mg by mouth 2 (two) times daily.    Historical Provider, MD  dicyclomine (BENTYL) 20 MG tablet Take 1 tablet (20 mg total) by mouth 2 (two) times daily as needed for spasms. Patient taking differently: Take 40 mg by mouth 2 (two) times daily.  05/04/14   Loren Racer, MD  Dimethyl Fumarate (TECFIDERA) 240 MG CPDR Take 240 mg by mouth 2 (two) times daily.     Historical Provider, MD  diphenhydrAMINE (BENADRYL) 25 mg capsule Take 50 mg by mouth 2 (two) times daily. With Tecfidera.    Historical Provider, MD  Fe Asp Gly-Succ-C-Thre-B12-FA (MULTIGEN FOLIC) 70-150-2-1 MG TABS Take 1 tablet by mouth 2 (two) times daily.    Historical Provider, MD  furosemide (LASIX) 40 MG tablet Take 40 mg by mouth daily.    Historical Provider, MD  gabapentin (NEURONTIN) 600 MG tablet Take 600 mg by mouth at bedtime.     Historical Provider, MD  HYDROcodone-acetaminophen (NORCO) 10-325 MG per tablet Take 1 tablet by mouth 4 (four) times daily.    Historical Provider, MD  HYDROcodone-acetaminophen (NORCO/VICODIN) 5-325 MG per tablet Take 1 tablet by mouth every 6 (six) hours as needed. Patient not taking: Reported on 10/20/2014 08/28/14   Tilden Fossa, MD  meclizine (ANTIVERT) 25 MG tablet Take 25 mg by mouth at bedtime as needed for dizziness.  01/25/13   Historical Provider, MD  Melatonin 5 MG CAPS Take 2-4 capsules by mouth at bedtime.    Historical Provider, MD  metoCLOPramide (REGLAN) 10 MG tablet Take 1 tablet every 6 hours as needed for nausea  or vomiting. Patient not taking: Reported on 10/20/2014 08/02/14   Paula Libra, MD  Multiple Vitamin (MULTIVITAMIN WITH MINERALS) TABS tablet Take 1 tablet by mouth at bedtime.     Historical Provider, MD  nortriptyline (PAMELOR) 50 MG capsule Take 100 mg by mouth at bedtime.    Historical Provider, MD  omeprazole (PRILOSEC) 20 MG capsule Take 1 capsule (20 mg total) by mouth daily. Patient taking differently: Take 20 mg by mouth daily as needed (for acid reflex).  05/24/14   Mirian Mo, MD  Ondansetron (ZOFRAN ODT PO) Take 1 tablet by mouth every 8 (eight) hours as needed (for nausea or vomiting). Unsure of dose and where she got medication filled    Historical Provider, MD  polyethylene  glycol (MIRALAX / GLYCOLAX) packet Take 17 g by mouth daily. 05/04/14   Marissa Sciacca, PA-C  potassium chloride SA (K-DUR,KLOR-CON) 20 MEQ tablet Take 20 mEq by mouth 2 (two) times daily.    Historical Provider, MD  progesterone (PROMETRIUM) 200 MG capsule Take 200 mg by mouth daily.    Historical Provider, MD  pseudoephedrine (SUDAFED) 30 MG tablet Take 60 mg by mouth at bedtime as needed for congestion.    Historical Provider, MD  SUMAtriptan (IMITREX) 100 MG tablet Take 100 mg by mouth every 2 (two) hours as needed for migraine or headache.     Historical Provider, MD  Vitamin D, Ergocalciferol, (DRISDOL) 50000 UNITS CAPS capsule Take 50,000 Units by mouth every Sunday.    Historical Provider, MD   Triage Vitals: BP 93/73 mmHg  Pulse 117  Temp(Src) 98.1 F (36.7 C) (Oral)  Resp 20  Ht 5\' 6"  (1.676 m)  Wt 185 lb (83.915 kg)  BMI 29.87 kg/m2  SpO2 100%   Physical Exam  Constitutional: She is oriented to person, place, and time. She appears well-developed and well-nourished.  Uncomfortable but nontoxic  HENT:  Head: Normocephalic and atraumatic.  Eyes: EOM are normal. Pupils are equal, round, and reactive to light.  Neck: Normal range of motion. Neck supple.  Cardiovascular: Normal rate, regular  rhythm and normal heart sounds.   No murmur heard. Pulmonary/Chest: Effort normal and breath sounds normal. No respiratory distress. She has no wheezes.  Abdominal: Soft. Bowel sounds are normal.  Neurological: She is alert and oriented to person, place, and time.  Cranial nerves II through XII intact, 5 out of 5 strength in all 4 extremities  Skin: Skin is warm and dry.  Psychiatric: She has a normal mood and affect.  Nursing note and vitals reviewed.   ED Course  Procedures (including critical care time)  DIAGNOSTIC STUDIES: Oxygen Saturation is 100% on RA, normal by my interpretation.    COORDINATION OF CARE: 11:11 PM-Discussed treatment plan which includes migraine cocktail with pt at bedside and pt agreed to plan.   Labs Review Labs Reviewed - No data to display  Imaging Review No results found.    EKG Interpretation None      MDM   Final diagnoses:  Migraine without status migrainosus, not intractable, unspecified migraine type    Patient presents with headache. She reports it is consistent with prior migraines. Nontoxic on exam. Afebrile. No signs or symptoms of meningitis. Doubt subarachnoid. Patient does have an extensive neurologic history including MS, pseudotumor, and trigeminal neuralgia in addition to migraines. However, given the patient's headache is characteristic of her migraines, will treat. She denies any vision changes. Patient was given headache cocktail. On recheck, patient resting comfortably.  She reports improvement of her pain.  She is requesting discharge home. Patient to follow-up with PCP and neurologist when necessary.  After history, exam, and medical workup I feel the patient has been appropriately medically screened and is safe for discharge home. Pertinent diagnoses were discussed with the patient. Patient was given return precautions.  I personally performed the services described in this documentation, which was scribed in my presence.  The recorded information has been reviewed and is accurate.      Shon Baton, MD 12/08/14 [phone]

## 2014-12-08 NOTE — Discharge Instructions (Signed)

## 2014-12-08 NOTE — ED Notes (Signed)
Pt verbalizes understanding of d/c instructions and denies any further needs at this time. 

## 2015-01-06 ENCOUNTER — Other Ambulatory Visit: Payer: Self-pay

## 2015-01-06 DIAGNOSIS — Z1231 Encounter for screening mammogram for malignant neoplasm of breast: Secondary | ICD-10-CM

## 2015-01-11 ENCOUNTER — Encounter (HOSPITAL_BASED_OUTPATIENT_CLINIC_OR_DEPARTMENT_OTHER): Payer: Self-pay | Admitting: Emergency Medicine

## 2015-01-11 ENCOUNTER — Emergency Department (HOSPITAL_BASED_OUTPATIENT_CLINIC_OR_DEPARTMENT_OTHER): Payer: Federal, State, Local not specified - PPO

## 2015-01-11 ENCOUNTER — Emergency Department (HOSPITAL_BASED_OUTPATIENT_CLINIC_OR_DEPARTMENT_OTHER)
Admission: EM | Admit: 2015-01-11 | Discharge: 2015-01-11 | Disposition: A | Discharge status: Home / Self Care | Payer: Federal, State, Local not specified - PPO | Attending: Emergency Medicine | Admitting: Emergency Medicine

## 2015-01-11 DIAGNOSIS — Z862 Personal history of diseases of the blood and blood-forming organs and certain disorders involving the immune mechanism: Secondary | ICD-10-CM | POA: Insufficient documentation

## 2015-01-11 DIAGNOSIS — Z79899 Other long term (current) drug therapy: Secondary | ICD-10-CM | POA: Insufficient documentation

## 2015-01-11 DIAGNOSIS — G500 Trigeminal neuralgia: Secondary | ICD-10-CM | POA: Diagnosis not present

## 2015-01-11 DIAGNOSIS — Z8679 Personal history of other diseases of the circulatory system: Secondary | ICD-10-CM | POA: Diagnosis not present

## 2015-01-11 DIAGNOSIS — Z3202 Encounter for pregnancy test, result negative: Secondary | ICD-10-CM | POA: Diagnosis not present

## 2015-01-11 DIAGNOSIS — G43909 Migraine, unspecified, not intractable, without status migrainosus: Secondary | ICD-10-CM | POA: Insufficient documentation

## 2015-01-11 DIAGNOSIS — G40909 Epilepsy, unspecified, not intractable, without status epilepticus: Secondary | ICD-10-CM | POA: Diagnosis not present

## 2015-01-11 DIAGNOSIS — R109 Unspecified abdominal pain: Secondary | ICD-10-CM | POA: Diagnosis present

## 2015-01-11 LAB — COMPREHENSIVE METABOLIC PANEL
ALBUMIN: 4 g/dL (ref 3.5–5.0)
ALK PHOS: 53 U/L (ref 38–126)
ALT: 19 U/L (ref 14–54)
ANION GAP: 4 — AB (ref 5–15)
AST: 20 U/L (ref 15–41)
BILIRUBIN TOTAL: 0.2 mg/dL — AB (ref 0.3–1.2)
BUN: 12 mg/dL (ref 6–20)
CALCIUM: 8.7 mg/dL — AB (ref 8.9–10.3)
CO2: 25 mmol/L (ref 22–32)
Chloride: 113 mmol/L — ABNORMAL HIGH (ref 101–111)
Creatinine, Ser: 0.86 mg/dL (ref 0.44–1.00)
GLUCOSE: 108 mg/dL — AB (ref 65–99)
Potassium: 3.8 mmol/L (ref 3.5–5.1)
SODIUM: 142 mmol/L (ref 135–145)
TOTAL PROTEIN: 6.8 g/dL (ref 6.5–8.1)

## 2015-01-11 LAB — URINALYSIS, ROUTINE W REFLEX MICROSCOPIC
BILIRUBIN URINE: NEGATIVE
Glucose, UA: NEGATIVE mg/dL
Hgb urine dipstick: NEGATIVE
KETONES UR: 15 mg/dL — AB
Leukocytes, UA: NEGATIVE
NITRITE: NEGATIVE
PH: 6.5 (ref 5.0–8.0)
Protein, ur: NEGATIVE mg/dL
Specific Gravity, Urine: 1.023 (ref 1.005–1.030)
Urobilinogen, UA: 1 mg/dL (ref 0.0–1.0)

## 2015-01-11 LAB — CBC WITH DIFFERENTIAL/PLATELET
BASOS ABS: 0 10*3/uL (ref 0.0–0.1)
BASOS PCT: 0 %
EOS ABS: 0.1 10*3/uL (ref 0.0–0.7)
Eosinophils Relative: 1 %
HCT: 40.6 % (ref 36.0–46.0)
Hemoglobin: 13.1 g/dL (ref 12.0–15.0)
LYMPHS PCT: 21 %
Lymphs Abs: 1.6 10*3/uL (ref 0.7–4.0)
MCH: 31.6 pg (ref 26.0–34.0)
MCHC: 32.3 g/dL (ref 30.0–36.0)
MCV: 98.1 fL (ref 78.0–100.0)
MONO ABS: 0.6 10*3/uL (ref 0.1–1.0)
Monocytes Relative: 8 %
NEUTROS ABS: 5.4 10*3/uL (ref 1.7–7.7)
NEUTROS PCT: 70 %
PLATELETS: 197 10*3/uL (ref 150–400)
RBC: 4.14 MIL/uL (ref 3.87–5.11)
RDW: 14.1 % (ref 11.5–15.5)
WBC: 7.7 10*3/uL (ref 4.0–10.5)

## 2015-01-11 LAB — PREGNANCY, URINE: Preg Test, Ur: NEGATIVE

## 2015-01-11 MED ORDER — SODIUM CHLORIDE 0.9 % IV BOLUS (SEPSIS)
1000.0000 mL | Freq: Once | INTRAVENOUS | Status: AC
  Administered 2015-01-11: 1000 mL via INTRAVENOUS

## 2015-01-11 MED ORDER — ONDANSETRON HCL 4 MG/2ML IJ SOLN
4.0000 mg | Freq: Once | INTRAMUSCULAR | Status: AC
  Administered 2015-01-11: 4 mg via INTRAVENOUS
  Filled 2015-01-11: qty 2

## 2015-01-11 NOTE — ED Notes (Signed)
Bladder scan resulted 56ml urine after void

## 2015-01-11 NOTE — Discharge Instructions (Signed)
Your imaging study did not show acute problems today. Return without fail for worsening symptoms, including worsening pain, fever, vomiting and unable to keep down food/fluids, or any other symptoms concerning to you.  Flank Pain Flank pain is pain in your side. The flank is the area of your side between your upper belly (abdomen) and your back. Pain in this area can be caused by many different things. HOME CARE Home care and treatment will depend on the cause of your pain.  Rest as told by your doctor.  Drink enough fluids to keep your pee (urine) clear or pale yellow.  Only take medicine as told by your doctor.  Tell your doctor about any changes in your pain.  Follow up with your doctor. GET HELP RIGHT AWAY IF:   Your pain does not get better with medicine.   You have new symptoms or your symptoms get worse.  Your pain gets worse.   You have belly (abdominal) pain.   You are short of breath.   You always feel sick to your stomach (nauseous).   You keep throwing up (vomiting).   You have puffiness (swelling) in your belly.   You feel light-headed or you pass out (faint).   You have blood in your pee.  You have a fever or lasting symptoms for more than 2-3 days.  You have a fever and your symptoms suddenly get worse. MAKE SURE YOU:   Understand these instructions.  Will watch your condition.  Will get help right away if you are not doing well or get worse.   This information is not intended to replace advice given to you by your health care provider. Make sure you discuss any questions you have with your health care provider.   Document Released: 11/28/2007 Document Revised: 03/11/2014 Document Reviewed: 10/03/2011 Elsevier Interactive Patient Education [employer]

## 2015-01-11 NOTE — ED Provider Notes (Signed)
CSN: [id number]     Arrival date & time 01/11/15  0831 History   First MD Initiated Contact with Patient 01/11/15 [phone]     Chief Complaint  Patient presents with  . Flank Pain     (Consider location/radiation/quality/duration/timing/severity/associated sxs/prior Treatment) HPI  44 year old female who presents with abdominal pain and flank pain. History of  MS and pseudotumor cerebri. Multiple abdominal surgeries as well, including gastric bypass, appendectomy, LOA, cholecystectomy. Noticed some right flank/back pain for past few days. Noticed some dysuria with this, but also had drop of blood via urethra today after urination. No vaginal bleeding or rectal bleeding. Has been eating and drinking, without vomiting, diarrhea, constipation or abdominal distension. Last bowel movement was this morning. States having similar pain in the past without clear etiology, but states that the drop of blood in her urine this morning is what concerned her.   Past Medical History  Diagnosis Date  . Migraine   . Trigeminal neuralgia   . Molar pregnancy   . PTSD (post-traumatic stress disorder)   . Pseudotumor cerebri   . MS (multiple sclerosis) (HCC)   . Mitral valve prolapse   . Iron deficiency anemia 10/18/2013  . Seizures (HCC)     10/2014 last seizure   Past Surgical History  Procedure Laterality Date  . Appendectomy    . Breast surgery    . Cholecystectomy    . Gastric bypass    . Tonsillectomy    . Carpal tunnel release    . Vaginal hysterectomy    . Bilateral salpingoophorectomy     Family History  Problem Relation Age of Onset  . Cancer Maternal Aunt     Colon Cancer   . Alcohol abuse Maternal Uncle   . Cancer Paternal Grandfather     Colon Cancer   Social History  Substance Use Topics  . Smoking status: Never Smoker   . Smokeless tobacco: Never Used  . Alcohol Use: Yes   OB History    No data available     Review of Systems 10/14 systems reviewed and are negative other  than those stated in the HPI    Allergies  Demerol; Morphine and related; Nucynta; Percocet; and Vicodin  Home Medications   Prior to Admission medications   Medication Sig Start Date End Date Taking? Authorizing Provider  carisoprodol (SOMA) 350 MG tablet Take 350 mg by mouth 4 (four) times daily as needed for muscle spasms.   Yes Historical Provider, MD  estradiol (CLIMARA - DOSED IN MG/24 HR) 0.1 mg/24hr patch Place 0.1 mg onto the skin 2 (two) times a week.   Yes Historical Provider, MD  furosemide (LASIX) 40 MG tablet Take 40 mg by mouth.   Yes Historical Provider, MD  loratadine (CLARITIN) 10 MG tablet Take 10 mg by mouth daily.   Yes Historical Provider, MD  omeprazole (PRILOSEC) 20 MG capsule Take 1 capsule (20 mg total) by mouth daily. Patient taking differently: Take 20 mg by mouth daily as needed (for acid reflex).  05/24/14  Yes Mirian Mo, MD  polyethylene glycol [facility] / GLYCOLAX) packet Take 17 g by mouth daily. 05/04/14  Yes Marissa Sciacca, PA-C  acyclovir (ZOVIRAX) 800 MG tablet Take 800 mg by mouth 2 (two) times daily.  01/29/13   Historical Provider, MD  b complex vitamins capsule Take 2 capsules by mouth daily.    Historical Provider, MD  BIOTIN PO Take 1 tablet by mouth daily.    Historical Provider, MD  Calcium Citrate-Vitamin D 500-500 MG-UNIT CHEW Chew 2 tablets by mouth at bedtime.     Historical Provider, MD  clonazePAM (KLONOPIN) 0.5 MG tablet Take 0.5 mg by mouth 3 (three) times daily.    Historical Provider, MD  Dimethyl Fumarate (TECFIDERA) 240 MG CPDR Take 240 mg by mouth 2 (two) times daily.     Historical Provider, MD  diphenhydrAMINE (BENADRYL) 25 mg capsule Take 50 mg by mouth 2 (two) times daily. With Tecfidera.    Historical Provider, MD  Fe Asp Gly-Succ-C-Thre-B12-FA (MULTIGEN FOLIC) 70-150-2-1 MG TABS Take 1 tablet by mouth 2 (two) times daily.    Historical Provider, MD  gabapentin (NEURONTIN) 600 MG tablet Take 600 mg by mouth at bedtime.      Historical Provider, MD  Melatonin 5 MG CAPS Take 2-4 capsules by mouth at bedtime.    Historical Provider, MD  Multiple Vitamin (MULTIVITAMIN WITH MINERALS) TABS tablet Take 1 tablet by mouth at bedtime.     Historical Provider, MD  potassium chloride SA (K-DUR,KLOR-CON) 20 MEQ tablet Take 20 mEq by mouth 2 (two) times daily.    Historical Provider, MD  pseudoephedrine (SUDAFED) 30 MG tablet Take 60 mg by mouth at bedtime as needed for congestion.    Historical Provider, MD  SUMAtriptan (IMITREX) 100 MG tablet Take 100 mg by mouth every 2 (two) hours as needed for migraine or headache.     Historical Provider, MD  Vitamin D, Ergocalciferol, (DRISDOL) 50000 UNITS CAPS capsule Take 50,000 Units by mouth every Sunday.    Historical Provider, MD   BP 111/76 mmHg  Pulse 82  Temp(Src) 98.5 F (36.9 C) (Oral)  Ht 5\' 6"  (1.676 m)  Wt 192 lb (87.091 kg)  BMI 31.00 kg/m2  SpO2 100% Physical Exam Physical Exam  Nursing note and vitals reviewed. Constitutional: Well developed, well nourished, non-toxic, and in no acute distress Head: Normocephalic and atraumatic.  Mouth/Throat: Oropharynx is clear and moist.  Neck: Normal range of motion. Neck supple.  Cardiovascular: Normal rate and regular rhythm.   Pulmonary/Chest: Effort normal and breath sounds normal.  Abdominal: Soft. There is mild RLQ tenderness. Mild right CVA tendernes and pain with palpation of right flank. There is no rebound and no guarding.  Musculoskeletal: Normal range of motion.  Neurological: Alert, no facial droop, fluent speech, moves all extremities symmetrically Skin: Skin is warm and dry.  Psychiatric: Cooperative  ED Course  Procedures (including critical care time) Labs Review Labs Reviewed  COMPREHENSIVE METABOLIC PANEL - Abnormal; Notable for the following:    Chloride 113 (*)    Glucose, Bld 108 (*)    Calcium 8.7 (*)    Total Bilirubin 0.2 (*)    Anion gap 4 (*)    All other components within normal limits   URINALYSIS, ROUTINE W REFLEX MICROSCOPIC (NOT AT [facility]) - Abnormal; Notable for the following:    Ketones, ur 15 (*)    All other components within normal limits  CBC WITH DIFFERENTIAL/PLATELET  PREGNANCY, URINE    Imaging Review Ct Renal Stone Study  01/11/2015  CLINICAL DATA:  Right flank pain since Friday.  Hematuria. EXAM: CT ABDOMEN AND PELVIS WITHOUT CONTRAST TECHNIQUE: Multidetector CT imaging of the abdomen and pelvis was performed following the standard protocol without IV contrast. COMPARISON:  10/20/2014 contrast-enhanced study. FINDINGS: Lower chest: Clear lung bases. Normal heart size without pericardial or pleural effusion. Hepatobiliary: Normal liver. Cholecystectomy, without biliary ductal dilatation. Pancreas: Pancreas is grossly normal. Previously described pancreatic duct dilatation is  not apparent on this stone study. Minimal motion in this area. Spleen: Normal in size, without focal abnormality. Adrenals/Urinary Tract: Normal adrenal glands. No renal calculi or hydronephrosis. No hydroureter or ureteric calculi. No bladder calculi. Stomach/Bowel: Status post Roux-en-Y gastric bypass. Normal caliber of the bypassed stomach. Colonic stool burden suggests constipation. Normal terminal ileum. Normal small bowel. Vascular/Lymphatic: Normal caliber of the aorta and branch vessels. No abdominopelvic adenopathy. Reproductive: Hysterectomy.  No adnexal mass. Other: No significant free fluid. Musculoskeletal: No acute osseous abnormality. IMPRESSION: 1.  No urinary tract calculi or hydronephrosis. 2.  Possible constipation. 3. Status post Roux-en-Y gastric bypass, without definite acute complication. Low sensitivity exam secondary to stone study technique. Electronically Signed   By: Jeronimo Greaves M.D.   On: 01/11/2015 11:04   I have personally reviewed and evaluated these images and lab results as part of my medical decision-making.   MDM   Final diagnoses:  Flank pain    44 year old  female with multiple abdominal surgeries and MS who presents with flank pain. Non-toxic and in no acute distress on presentation. Vital signs non-concerning and without signs of systemic illness. Abdomen is over soft, but has mild RLQ tenderness, mild CVA tenderness also noted on right side. UA without WBCs, bacteria, or leukocytes. No evidence of hgb or RBCs. Not concern for pyelonphritis or UTI. CT renal stone protocol also performed and no evidence of kidney stone, or there acute abdominal/pelvic processes. AT this time, unclear etiology of symptoms, may be msk. Discussed 1-2 day follow-up with PCP for repeat exam/re-evaluation. Discussed strict return instructions. She expressed understanding of all discharge instructions and felt comfortable with the plan of care.      Lavera Guise, MD 01/11/15 [phone]

## 2015-01-11 NOTE — ED Notes (Signed)
Pt having right sided flank pain.  Pt also having dysuria with some blood noted at end of urine stream.  Pt also seeing blood on her underwear.  No known fever.  Some chills.

## 2015-01-11 NOTE — ED Notes (Signed)
Pt in CT.

## 2015-01-11 NOTE — ED Notes (Signed)
Returned from CT.

## 2015-01-23 ENCOUNTER — Ambulatory Visit: Payer: Federal, State, Local not specified - PPO

## 2015-03-01 ENCOUNTER — Encounter (HOSPITAL_BASED_OUTPATIENT_CLINIC_OR_DEPARTMENT_OTHER): Payer: Self-pay | Admitting: Emergency Medicine

## 2015-03-01 ENCOUNTER — Emergency Department (HOSPITAL_BASED_OUTPATIENT_CLINIC_OR_DEPARTMENT_OTHER): Payer: Federal, State, Local not specified - PPO

## 2015-03-01 ENCOUNTER — Emergency Department (HOSPITAL_BASED_OUTPATIENT_CLINIC_OR_DEPARTMENT_OTHER)
Admission: EM | Admit: 2015-03-01 | Discharge: 2015-03-02 | Disposition: A | Discharge status: Home / Self Care | Payer: Federal, State, Local not specified - PPO | Attending: Emergency Medicine | Admitting: Emergency Medicine

## 2015-03-01 DIAGNOSIS — R0602 Shortness of breath: Secondary | ICD-10-CM | POA: Diagnosis not present

## 2015-03-01 DIAGNOSIS — R05 Cough: Secondary | ICD-10-CM | POA: Diagnosis not present

## 2015-03-01 DIAGNOSIS — Z79899 Other long term (current) drug therapy: Secondary | ICD-10-CM | POA: Diagnosis not present

## 2015-03-01 DIAGNOSIS — R200 Anesthesia of skin: Secondary | ICD-10-CM | POA: Insufficient documentation

## 2015-03-01 DIAGNOSIS — G500 Trigeminal neuralgia: Secondary | ICD-10-CM | POA: Diagnosis not present

## 2015-03-01 DIAGNOSIS — Z8679 Personal history of other diseases of the circulatory system: Secondary | ICD-10-CM | POA: Insufficient documentation

## 2015-03-01 DIAGNOSIS — M7989 Other specified soft tissue disorders: Secondary | ICD-10-CM | POA: Diagnosis not present

## 2015-03-01 DIAGNOSIS — G43909 Migraine, unspecified, not intractable, without status migrainosus: Secondary | ICD-10-CM | POA: Insufficient documentation

## 2015-03-01 DIAGNOSIS — R110 Nausea: Secondary | ICD-10-CM | POA: Diagnosis not present

## 2015-03-01 DIAGNOSIS — H578 Other specified disorders of eye and adnexa: Secondary | ICD-10-CM | POA: Diagnosis present

## 2015-03-01 DIAGNOSIS — H1131 Conjunctival hemorrhage, right eye: Secondary | ICD-10-CM | POA: Diagnosis not present

## 2015-03-01 DIAGNOSIS — G35 Multiple sclerosis: Secondary | ICD-10-CM | POA: Insufficient documentation

## 2015-03-01 DIAGNOSIS — Z8659 Personal history of other mental and behavioral disorders: Secondary | ICD-10-CM | POA: Insufficient documentation

## 2015-03-01 DIAGNOSIS — R519 Headache, unspecified: Secondary | ICD-10-CM

## 2015-03-01 DIAGNOSIS — Z862 Personal history of diseases of the blood and blood-forming organs and certain disorders involving the immune mechanism: Secondary | ICD-10-CM | POA: Diagnosis not present

## 2015-03-01 DIAGNOSIS — R51 Headache: Secondary | ICD-10-CM

## 2015-03-01 DIAGNOSIS — R079 Chest pain, unspecified: Secondary | ICD-10-CM

## 2015-03-01 LAB — D-DIMER, QUANTITATIVE (NOT AT ARMC): D DIMER QUANT: 0.46 ug{FEU}/mL (ref 0.00–0.50)

## 2015-03-01 LAB — TROPONIN I: Troponin I: <0.03 ng/mL (ref <0.031)

## 2015-03-01 MED ORDER — DIPHENHYDRAMINE HCL 50 MG/ML IJ SOLN
25.0000 mg | Freq: Once | INTRAMUSCULAR | Status: AC
  Administered 2015-03-01: 25 mg via INTRAVENOUS
  Filled 2015-03-01: qty 1

## 2015-03-01 MED ORDER — PROCHLORPERAZINE EDISYLATE 5 MG/ML IJ SOLN
10.0000 mg | Freq: Four times a day (QID) | INTRAMUSCULAR | Status: DC | PRN
  Administered 2015-03-01: 10 mg via INTRAVENOUS
  Filled 2015-03-01: qty 2

## 2015-03-01 MED ORDER — SODIUM CHLORIDE 0.9 % IV BOLUS (SEPSIS)
1000.0000 mL | Freq: Once | INTRAVENOUS | Status: AC
Start: 2015-03-01 — End: 2015-03-02
  Administered 2015-03-01: 1000 mL via INTRAVENOUS

## 2015-03-01 MED ORDER — TETRACAINE HCL 0.5 % OP SOLN
2.0000 [drp] | Freq: Once | OPHTHALMIC | Status: AC
  Administered 2015-03-01: 2 [drp] via OPHTHALMIC
  Filled 2015-03-01: qty 4

## 2015-03-01 NOTE — ED Provider Notes (Signed)
CSN: [id number]     Arrival date & time 03/01/15  2235 History  By signing my name below, I, Budd Palmer, attest that this documentation has been prepared under the direction and in the presence of Alvira Monday, MD. Electronically Signed: Budd Palmer, ED Scribe. 03/01/2015. 11:24 PM.     Chief Complaint  Patient presents with  . Pressure Behind the Eyes   The history is provided by the patient. No language interpreter was used.   HPI Comments: JOLINDA NEUNER is a 44 y.o. female with a PMHx of migraine, trigeminal neuralgia, pseudotumor cerebri, MS, mitral vavle prolapse, anemia, and seizures who presents to the Emergency Department complaining of intermittent pressure behind the eyes, worse in the right eye, onset 1 week ago. She currently rates her pain as a 10/10. She reports associated intermittent, pressure-like HA onset a few days ago, and notes that today's HA started while at rest. She also endorses bleeding in the eyes just PTA, blurred vision (?slightly worse than baseline), numbness (baseline from MS), nausea, mild cough, CP, SOB, and leg swelling bilaterally. She notes she has had similar symptoms before seeing a neurologist last week. She reports exacerbation of the pain with leaning forward. She notes she took Neurontin, Tecfidera, Klonopin, and Glycolax just PTA. She notes that nose of these alleviated the HA. She states she is also taking estradiol. She denies a PMHx of DVT. She also denies use of any new drugs or caffeine intake. Pt denies vomiting, diarrhea, abdominal pain, urinary symptoms, and fever.   Past Medical History  Diagnosis Date  . Migraine   . Trigeminal neuralgia   . Molar pregnancy   . PTSD (post-traumatic stress disorder)   . Pseudotumor cerebri   . MS (multiple sclerosis) (HCC)   . Mitral valve prolapse   . Iron deficiency anemia 10/18/2013  . Seizures (HCC)     10/2014 last seizure   Past Surgical History  Procedure Laterality Date  . Appendectomy     . Breast surgery    . Cholecystectomy    . Gastric bypass    . Tonsillectomy    . Carpal tunnel release    . Vaginal hysterectomy    . Bilateral salpingoophorectomy     Family History  Problem Relation Age of Onset  . Cancer Maternal Aunt     Colon Cancer   . Alcohol abuse Maternal Uncle   . Cancer Paternal Grandfather     Colon Cancer   Social History  Substance Use Topics  . Smoking status: Never Smoker   . Smokeless tobacco: Never Used  . Alcohol Use: Yes   OB History    No data available     Review of Systems  Constitutional: Negative for fever.  HENT: Negative for sore throat.   Eyes: Positive for pain, redness and visual disturbance.  Respiratory: Positive for cough and shortness of breath.   Cardiovascular: Positive for chest pain and leg swelling (bilateral).  Gastrointestinal: Positive for nausea. Negative for vomiting, abdominal pain and diarrhea.  Genitourinary: Negative for dysuria and difficulty urinating.  Musculoskeletal: Negative for back pain and neck pain.  Skin: Negative for rash.  Neurological: Positive for numbness and headaches. Negative for syncope, speech difficulty and weakness.    Allergies  Demerol; Morphine and related; Nucynta; Percocet; and Vicodin  Home Medications   Prior to Admission medications   Medication Sig Start Date End Date Taking? Authorizing Provider  acyclovir (ZOVIRAX) 800 MG tablet Take 800 mg by mouth 2 (two)  times daily.  01/29/13   Historical Provider, MD  b complex vitamins capsule Take 2 capsules by mouth daily.    Historical Provider, MD  BIOTIN PO Take 1 tablet by mouth daily.    Historical Provider, MD  Calcium Citrate-Vitamin D 500-500 MG-UNIT CHEW Chew 2 tablets by mouth at bedtime.     Historical Provider, MD  carisoprodol (SOMA) 350 MG tablet Take 350 mg by mouth 4 (four) times daily as needed for muscle spasms.    Historical Provider, MD  clonazePAM (KLONOPIN) 0.5 MG tablet Take 0.5 mg by mouth 3 (three)  times daily.    Historical Provider, MD  Dimethyl Fumarate (TECFIDERA) 240 MG CPDR Take 240 mg by mouth 2 (two) times daily.     Historical Provider, MD  diphenhydrAMINE (BENADRYL) 25 mg capsule Take 50 mg by mouth 2 (two) times daily. With Tecfidera.    Historical Provider, MD  estradiol (CLIMARA - DOSED IN MG/24 HR) 0.1 mg/24hr patch Place 0.1 mg onto the skin 2 (two) times a week.    Historical Provider, MD  Fe Asp Gly-Succ-C-Thre-B12-FA (MULTIGEN FOLIC) 70-150-2-1 MG TABS Take 1 tablet by mouth 2 (two) times daily.    Historical Provider, MD  furosemide (LASIX) 40 MG tablet Take 40 mg by mouth.    Historical Provider, MD  gabapentin (NEURONTIN) 600 MG tablet Take 600 mg by mouth at bedtime.     Historical Provider, MD  loratadine (CLARITIN) 10 MG tablet Take 10 mg by mouth daily.    Historical Provider, MD  Melatonin 5 MG CAPS Take 2-4 capsules by mouth at bedtime.    Historical Provider, MD  Multiple Vitamin (MULTIVITAMIN WITH MINERALS) TABS tablet Take 1 tablet by mouth at bedtime.     Historical Provider, MD  omeprazole (PRILOSEC) 20 MG capsule Take 1 capsule (20 mg total) by mouth daily. Patient taking differently: Take 20 mg by mouth daily as needed (for acid reflex).  05/24/14   Mirian Mo, MD  polyethylene glycol [facility] / Ethelene Hal) packet Take 17 g by mouth daily. 05/04/14   Marissa Sciacca, PA-C  potassium chloride SA (K-DUR,KLOR-CON) 20 MEQ tablet Take 20 mEq by mouth 2 (two) times daily.    Historical Provider, MD  pseudoephedrine (SUDAFED) 30 MG tablet Take 60 mg by mouth at bedtime as needed for congestion.    Historical Provider, MD  SUMAtriptan (IMITREX) 100 MG tablet Take 100 mg by mouth every 2 (two) hours as needed for migraine or headache.     Historical Provider, MD  Vitamin D, Ergocalciferol, (DRISDOL) 50000 UNITS CAPS capsule Take 50,000 Units by mouth every Sunday.    Historical Provider, MD   BP 119/85 mmHg  Pulse 85  Temp(Src) 98.5 F (36.9 C) (Oral)  Resp 12  Ht   (1.676 m)  Wt 185 lb (83.915 kg)  BMI 29.87 kg/m2  SpO2 95%  LMP  Physical Exam  Constitutional: She is oriented to person, place, and time. She appears well-developed and well-nourished. No distress.  HENT:  Head: Normocephalic and atraumatic.  Eyes: EOM are normal. Pupils are equal, round, and reactive to light. Right eye exhibits no discharge. Left eye exhibits no discharge. Right conjunctiva is not injected. Right conjunctiva has a hemorrhage. Left conjunctiva is not injected. Left conjunctiva has no hemorrhage. Right eye exhibits normal extraocular motion and no nystagmus. Left eye exhibits normal extraocular motion and no nystagmus. Right pupil is round and reactive. Left pupil is round and reactive. Pupils are equal.  IOP 11  bialterally  Neck: Normal range of motion.  Cardiovascular: Normal rate, regular rhythm, normal heart sounds and intact distal pulses.  Exam reveals no gallop and no friction rub.   No murmur heard. Pulmonary/Chest: Effort normal and breath sounds normal. No respiratory distress. She has no wheezes. She has no rales.  Abdominal: Soft. She exhibits no distension. There is no tenderness. There is no guarding.  Musculoskeletal: She exhibits no edema or tenderness.  Neurological: She is alert and oriented to person, place, and time. She has normal strength. A sensory deficit (left face, arm, leg (baseline per pt from MS)) is present. No cranial nerve deficit. Coordination normal. GCS eye subscore is 4. GCS verbal subscore is 5. GCS motor subscore is 6.  Skin: Skin is warm and dry. No rash noted. She is not diaphoretic. No erythema.  Psychiatric: She has a normal mood and affect.  Nursing note and vitals reviewed.   ED Course  Procedures  DIAGNOSTIC STUDIES: Oxygen Saturation is 100% on RA, normal by my interpretation.    COORDINATION OF CARE: 11:15 PM - Discussed plans to order diagnostic studies and imaging, as well as a migraine cocktail. Pt advised of  plan for treatment and pt agrees.  Labs Review Labs Reviewed  CBC WITH DIFFERENTIAL/PLATELET - Abnormal; Notable for the following:    WBC 12.5 (*)    Neutro Abs 9.0 (*)    Monocytes Absolute 1.1 (*)    All other components within normal limits  COMPREHENSIVE METABOLIC PANEL - Abnormal; Notable for the following:    Chloride 117 (*)    CO2 18 (*)    Glucose, Bld 128 (*)    Calcium 8.5 (*)    All other components within normal limits  TROPONIN I  D-DIMER, QUANTITATIVE (NOT AT [facility])  TROPONIN I    Imaging Review Dg Chest 2 View  03/02/2015  CLINICAL DATA:  Acute onset of shortness of breath. Migraine headache, with mild bleeding at the right orbit. Initial encounter. EXAM: CHEST  2 VIEW COMPARISON:  Chest radiograph from 08/01/2014 FINDINGS: The lungs are well-aerated and clear. There is no evidence of focal opacification, pleural effusion or pneumothorax. The heart is normal in size; the mediastinal contour is within normal limits. No acute osseous abnormalities are seen. IMPRESSION: No acute cardiopulmonary process seen. Electronically Signed   By: Roanna Raider M.D.   On: 03/02/2015 00:08   I have personally reviewed and evaluated these images and lab results as part of my medical decision-making.   EKG Interpretation   Date/Time:  Wednesday March 01 2015 23:28:53 EST Ventricular Rate:  115 PR Interval:  115 QRS Duration: 82 QT Interval:  305 QTC Calculation: 422 R Axis:   60 Text Interpretation:  Sinus tachycardia Probable left atrial enlargement  Low voltage, precordial leads Nonspecific T abnormalities, diffuse leads  ED PHYSICIAN INTERPRETATION AVAILABLE IN [facility] Confirmed by  TEST, Record ([id number]) on 03/02/2015 7:01:42 AM      MDM   Final diagnoses:  Acute nonintractable headache, unspecified headache type  Subconjunctival hemorrhage of right eye  MS (multiple sclerosis) (HCC)  Chest pain, unspecified chest pain type    44 y.o. female with a PMHx  of migraine, trigeminal neuralgia, pseudotumor cerebri, MS, mitral vavle prolapse, anemia, and seizures who presents to the Emergency Department with intermittent pressure behind the eyes, worse in the right eye, onset 1 week ago with biggest concern for patient being bleeding in eye. Exam shows subconjunctival hemorrhage. No signs of hyphema. IOP are  normal bilaterally.  No pupillary abnormalities and doubt optic neuritis at this time (Pt also saw neurologist last week with same symptoms and it was not felt to be optic neuritis at that time.).  Regarding HA, hx not consistent with SAH, neuro exam unchanged from baseline (numbness left side from MS) and pt has hx of multiple etiologies of HA and this likely represts pain from benign intracranial hypertension or migraine.  Given headache cocktail and HA improved.  Patient noted to be tachycardic on arrival to ED and on review of systems also reports chest pain and shortness of breath earlier today which resolved prior to presentation to ED.  Given tachycardia, estrogen use, ordered ddimer. EKG without acute changes and delta troponins negative and doubt ACS.  Pt low risk wells and ddimer negative. HR improved after given IVF and pain control.    Pt to see Cardiology as outpt next week and will follow up with Neurology and PCP. Patient discharged in stable condition with understanding of reasons to return.   I personally performed the services described in this documentation, which was scribed in my presence. The recorded information has been reviewed and is accurate.   Alvira Monday, MD 03/02/15 2003

## 2015-03-01 NOTE — ED Notes (Signed)
Patient transported to X-ray 

## 2015-03-01 NOTE — ED Notes (Signed)
Patient states that she has had a migraine for the last few days. Has taken her home medications and no assistance. The patient states that she started to have blood to her right eye iris. The patient reports that she was not going to come for the HA  - but was worried about the blood in her eye

## 2015-03-02 LAB — CBC WITH DIFFERENTIAL/PLATELET
BASOS ABS: 0 10*3/uL (ref 0.0–0.1)
BASOS PCT: 0 %
EOS ABS: 0.1 10*3/uL (ref 0.0–0.7)
EOS PCT: 1 %
HCT: 39.2 % (ref 36.0–46.0)
Hemoglobin: 12.7 g/dL (ref 12.0–15.0)
LYMPHS PCT: 18 %
Lymphs Abs: 2.3 10*3/uL (ref 0.7–4.0)
MCH: 31.8 pg (ref 26.0–34.0)
MCHC: 32.4 g/dL (ref 30.0–36.0)
MCV: 98.2 fL (ref 78.0–100.0)
MONO ABS: 1.1 10*3/uL — AB (ref 0.1–1.0)
Monocytes Relative: 9 %
Neutro Abs: 9 10*3/uL — ABNORMAL HIGH (ref 1.7–7.7)
Neutrophils Relative %: 72 %
PLATELETS: 199 10*3/uL (ref 150–400)
RBC: 3.99 MIL/uL (ref 3.87–5.11)
RDW: 13.3 % (ref 11.5–15.5)
WBC: 12.5 10*3/uL — AB (ref 4.0–10.5)

## 2015-03-02 LAB — COMPREHENSIVE METABOLIC PANEL
ALT: 35 U/L (ref 14–54)
ANION GAP: 7 (ref 5–15)
AST: 32 U/L (ref 15–41)
Albumin: 3.9 g/dL (ref 3.5–5.0)
Alkaline Phosphatase: 66 U/L (ref 38–126)
BUN: 13 mg/dL (ref 6–20)
CALCIUM: 8.5 mg/dL — AB (ref 8.9–10.3)
CHLORIDE: 117 mmol/L — AB (ref 101–111)
CO2: 18 mmol/L — AB (ref 22–32)
CREATININE: 0.94 mg/dL (ref 0.44–1.00)
Glucose, Bld: 128 mg/dL — ABNORMAL HIGH (ref 65–99)
Potassium: 3.6 mmol/L (ref 3.5–5.1)
SODIUM: 142 mmol/L (ref 135–145)
Total Bilirubin: 0.4 mg/dL (ref 0.3–1.2)
Total Protein: 7 g/dL (ref 6.5–8.1)

## 2015-03-02 LAB — TROPONIN I

## 2015-03-07 ENCOUNTER — Encounter: Payer: Federal, State, Local not specified - PPO | Admitting: Physician Assistant

## 2015-03-07 NOTE — Progress Notes (Signed)
    Cardiology Office Note   Date:  03/07/2015   ID:  Joyce Lang, DOB 1970/05/12, MRN [id number]  PCP:  Birdena Jubilee, MD  Cardiologist:  Dr Luna Kitchens, PA-C   No chief complaint on file.   History of Present Illness: Joyce Lang is a 45 y.o. female with a history of PTSD, MS, MVP, Sz. Seen in ER 12/28 w/ HA, chest pain and SOB. D-dimer neg, IVF given and tachycardia improved. D/c from ER to f/u w/ cards.

## 2015-03-09 ENCOUNTER — Encounter: Payer: Federal, State, Local not specified - PPO | Admitting: Physician Assistant

## 2015-03-09 NOTE — Progress Notes (Signed)
    Cardiology Office Note   Date:  03/09/2015   ID:  Joyce Lang, DOB 1971-01-05, MRN [id number]  PCP:  Birdena Jubilee, MD  Cardiologist:  Dr Luna Kitchens, PA-C   No chief complaint on file.   History of Present Illness: Joyce Lang is a 45 y.o. female with a history of iron def anemia, Sz, MS, gastric bypass, anxiety, nl EF w/ trivial MR by echo 09/2013

## 2015-03-23 ENCOUNTER — Ambulatory Visit
Admission: RE | Admit: 2015-03-23 | Discharge: 2015-03-23 | Disposition: A | Discharge status: Home / Self Care | Payer: Federal, State, Local not specified - PPO | Source: Ambulatory Visit

## 2015-03-23 ENCOUNTER — Encounter: Payer: Self-pay | Admitting: *Deleted

## 2015-03-23 ENCOUNTER — Other Ambulatory Visit: Payer: Self-pay

## 2015-03-23 ENCOUNTER — Other Ambulatory Visit: Payer: Self-pay | Admitting: Family Medicine

## 2015-03-23 ENCOUNTER — Ambulatory Visit
Admission: RE | Admit: 2015-03-23 | Discharge: 2015-03-23 | Disposition: A | Discharge status: Home / Self Care | Payer: Federal, State, Local not specified - PPO | Source: Ambulatory Visit | Attending: Family Medicine | Admitting: Family Medicine

## 2015-03-23 DIAGNOSIS — N6320 Unspecified lump in the left breast, unspecified quadrant: Secondary | ICD-10-CM

## 2015-03-23 DIAGNOSIS — Z1231 Encounter for screening mammogram for malignant neoplasm of breast: Secondary | ICD-10-CM

## 2015-03-27 MED FILL — HYDROCODON-APAP 10-325: 10-325 | 30 days supply | Qty: 120 | Fill #0

## 2015-04-06 ENCOUNTER — Other Ambulatory Visit: Payer: Self-pay | Admitting: General Surgery

## 2015-04-10 ENCOUNTER — Encounter (HOSPITAL_BASED_OUTPATIENT_CLINIC_OR_DEPARTMENT_OTHER): Payer: Self-pay | Admitting: *Deleted

## 2015-04-10 ENCOUNTER — Encounter (HOSPITAL_BASED_OUTPATIENT_CLINIC_OR_DEPARTMENT_OTHER)
Admission: RE | Admit: 2015-04-10 | Discharge: 2015-04-10 | Disposition: A | Discharge status: Home / Self Care | Payer: Federal, State, Local not specified - PPO | Source: Ambulatory Visit | Attending: General Surgery | Admitting: General Surgery

## 2015-04-10 DIAGNOSIS — Z79890 Hormone replacement therapy (postmenopausal): Secondary | ICD-10-CM | POA: Diagnosis not present

## 2015-04-10 DIAGNOSIS — Z90722 Acquired absence of ovaries, bilateral: Secondary | ICD-10-CM | POA: Diagnosis not present

## 2015-04-10 DIAGNOSIS — F419 Anxiety disorder, unspecified: Secondary | ICD-10-CM | POA: Diagnosis not present

## 2015-04-10 DIAGNOSIS — Z90710 Acquired absence of both cervix and uterus: Secondary | ICD-10-CM | POA: Diagnosis not present

## 2015-04-10 DIAGNOSIS — Z9079 Acquired absence of other genital organ(s): Secondary | ICD-10-CM | POA: Diagnosis not present

## 2015-04-10 DIAGNOSIS — Y838 Other surgical procedures as the cause of abnormal reaction of the patient, or of later complication, without mention of misadventure at the time of the procedure: Secondary | ICD-10-CM | POA: Diagnosis not present

## 2015-04-10 DIAGNOSIS — R011 Cardiac murmur, unspecified: Secondary | ICD-10-CM | POA: Diagnosis not present

## 2015-04-10 DIAGNOSIS — G35 Multiple sclerosis: Secondary | ICD-10-CM | POA: Diagnosis not present

## 2015-04-10 DIAGNOSIS — S20152A Superficial foreign body of breast, left breast, initial encounter: Secondary | ICD-10-CM | POA: Diagnosis present

## 2015-04-10 DIAGNOSIS — Z79899 Other long term (current) drug therapy: Secondary | ICD-10-CM | POA: Diagnosis not present

## 2015-04-10 DIAGNOSIS — G43909 Migraine, unspecified, not intractable, without status migrainosus: Secondary | ICD-10-CM | POA: Diagnosis not present

## 2015-04-10 DIAGNOSIS — M1990 Unspecified osteoarthritis, unspecified site: Secondary | ICD-10-CM | POA: Diagnosis not present

## 2015-04-10 DIAGNOSIS — F329 Major depressive disorder, single episode, unspecified: Secondary | ICD-10-CM | POA: Diagnosis not present

## 2015-04-10 DIAGNOSIS — Z9884 Bariatric surgery status: Secondary | ICD-10-CM | POA: Diagnosis not present

## 2015-04-10 DIAGNOSIS — G40909 Epilepsy, unspecified, not intractable, without status epilepticus: Secondary | ICD-10-CM | POA: Diagnosis not present

## 2015-04-10 LAB — COMPREHENSIVE METABOLIC PANEL
ALK PHOS: 53 U/L (ref 38–126)
ALT: 18 U/L (ref 14–54)
AST: 23 U/L (ref 15–41)
Albumin: 4 g/dL (ref 3.5–5.0)
Anion gap: 12 (ref 5–15)
BILIRUBIN TOTAL: 0.8 mg/dL (ref 0.3–1.2)
BUN: 9 mg/dL (ref 6–20)
CALCIUM: 8.9 mg/dL (ref 8.9–10.3)
CO2: 21 mmol/L — ABNORMAL LOW (ref 22–32)
CREATININE: 0.81 mg/dL (ref 0.44–1.00)
Chloride: 111 mmol/L (ref 101–111)
GFR calc Af Amer: >60 mL/min (ref >60)
Glucose, Bld: 110 mg/dL — ABNORMAL HIGH (ref 65–99)
Potassium: 3.3 mmol/L — ABNORMAL LOW (ref 3.5–5.1)
Sodium: 144 mmol/L (ref 135–145)
TOTAL PROTEIN: 6.8 g/dL (ref 6.5–8.1)

## 2015-04-10 LAB — CBC WITH DIFFERENTIAL/PLATELET
BASOS ABS: 0 10*3/uL (ref 0.0–0.1)
Basophils Relative: 0 %
Eosinophils Absolute: 0 10*3/uL (ref 0.0–0.7)
Eosinophils Relative: 1 %
HEMATOCRIT: 40 % (ref 36.0–46.0)
Hemoglobin: 13.3 g/dL (ref 12.0–15.0)
LYMPHS PCT: 26 %
Lymphs Abs: 2.1 10*3/uL (ref 0.7–4.0)
MCH: 32.2 pg (ref 26.0–34.0)
MCHC: 33.3 g/dL (ref 30.0–36.0)
MCV: 96.9 fL (ref 78.0–100.0)
MONO ABS: 0.7 10*3/uL (ref 0.1–1.0)
Monocytes Relative: 8 %
NEUTROS ABS: 5.4 10*3/uL (ref 1.7–7.7)
Neutrophils Relative %: 65 %
Platelets: 189 10*3/uL (ref 150–400)
RBC: 4.13 MIL/uL (ref 3.87–5.11)
RDW: 13.9 % (ref 11.5–15.5)
WBC: 8.2 10*3/uL (ref 4.0–10.5)

## 2015-04-10 NOTE — Progress Notes (Signed)
Lab cleared by dr crews  Potassium  ok

## 2015-04-11 NOTE — H&P (Signed)
Joyce Lang  Location: [facility] Patient #: [id number] DOB: Jul 28, 1970 Single / Language: [place] / Race: Black or African American Female       History of Present Illness    The patient is a 45 year old female who presents with a complaint of Breast problems. This is a 45 year old African American female, referred by Dr. Annia Belt at the breast center [facility] for evaluation and management of what is thought to be retained suture material in the left breast periareolar area.  The patient gives a history of bilateral breast reduction when she was in the [employer] in 1999. She says post op they pulled a suture out of the inframammary area but does not remember any sutures in the periareolar area. About 6 months ago she felt a linear density in the left periareolar area starting medially and extending more superiorly. There's been no drainage or induration or infection.  She had bilateral mammograms and ultrasound left breast on March 22, 2005 obtained. Category B density. There were no radiographic abnormalities but Dr. Earlene Plater felt a linear density and he theorized that this was suture material.  Comorbidities include multiple most multiple sclerosis, pseudotumor cerebri, strong family history: Cancer, migraine headaches, GERD, history of gastric bypass, history cholecystectomy and appendectomy, history total abdominal hysterectomy with bilateral oophorectomy. She takes hormone replacement therapy.  Family history reveals colon cancer in a maternal aunt, maternal great great-grandfather, and paternal grandfather. She gets regular colonoscopies. His history reveals that she is single. Works for the [employer] disability claim. Denies tobacco.  She is frustrated by the discomfort and is somewhat anxious about what is going on and she really wants this area explored. I explained the uncertainty. I explained the risk of bleeding, infection, ischemia to the nipple  and areolar skin, nerve damage with chronic pain making the pain worse. I told her I really wasn't sure of the diagnosis and not sure whether her discomfort would get better but that we can explore this area seemed reasonable. She is aware that there may be retained foreign body even after the surgery. All of her questions are answered. She understands all these issues. She agrees with this plan. A plan to do this under LMA general anesthesia and make a circumareolar incision starting at 3:00 and extending superiorly as necessary.   Other Problems  Anxiety Disorder Arthritis Back Pain Cancer Depression General anesthesia - complications Heart murmur Hemorrhoids Migraine Headache Oophorectomy Bilateral. Pancreatitis Seizure Disorder  Past Surgical History  Appendectomy Colon Polyp Removal - Colonoscopy Gastric Bypass Hysterectomy (not due to cancer) - Complete Mammoplasty; Reduction Bilateral.  Diagnostic Studies History Gilmer Mor, CMA; 04/06/2015 8:35 AM) Colonoscopy 1-5 years ago Mammogram within last year Pap Smear 1-5 years ago  Allergies  Morphine Sulfate (Concentrate) *ANALGESICS - OPIOID* Demerol *ANALGESICS - OPIOID* Nucynta ER *ANALGESICS - OPIOID* Augmentin *PENICILLINS* Percocet *ANALGESICS - OPIOID*  Medication History  Zovirax (800MG  Tablet, Oral) Active. B Complex Vitamins (Oral) Active. Biotin Active. Calcium Citrate Chewy Bite (500-500MG -UNIT Tablet Chewable, Oral) Active. Soma (350MG  Tablet, Oral) Active. KlonoPIN (0.5MG  Tablet, Oral) Active. Tecfidera (240MG  Capsule DR, Oral) Active. Benadryl Allergy (25MG  Capsule, Oral) Active. Multigen Folic (70-150-2-1MG  Tablet, Oral) Active. Lasix (40MG  Tablet, Oral) Active. Gabapentin (600MG  Tablet, Oral) Active. Claritin (10MG  Tablet, Oral) Active. Melatonin (5MG  Capsule, Oral) Active. Multiple Vitamin (Oral) Active. PriLOSEC (20MG  Capsule DR, Oral)  Active. MiraLax (Oral) Active. Sudafed (30MG  Tablet, Oral) Active. Imitrex (100MG  Tablet, Oral) Active. Drisdol (50000UNIT Capsule, Oral) Active. Medications Reconciled  Social  History  Alcohol use Occasional alcohol use. Caffeine use Coffee, Tea. Illicit drug use Uses socially only. Tobacco use Never smoker.  Family History  Arthritis Father, Mother, Sister. Colon Cancer Family Members In General. Colon Polyps Father, Mother. Depression Father, Mother. Heart Disease Brother, Father, Mother, Sister. Heart disease in female family member before age 21 Heart disease in female family member before age 41 Hypertension Brother, Mother, Sister. Migraine Headache Brother, Mother, Sister. Prostate Cancer Father. Respiratory Condition Father.  Pregnancy / Birth History  Age at menarche 11 years. Age of menopause <45 Gravida 3 Maternal age 52-20 Para 2    Review of Systems  General Present- Fatigue and Weight Gain. Not Present- Appetite Loss, Chills, Fever, Night Sweats and Weight Loss. Skin Present- Dryness and Hives. Not Present- Change in Wart/Mole, Jaundice, New Lesions, Non-Healing Wounds, Rash and Ulcer. HEENT Present- Oral Ulcers and Wears glasses/contact lenses. Not Present- Earache, Hearing Loss, Hoarseness, Nose Bleed, Ringing in the Ears, Seasonal Allergies, Sinus Pain, Sore Throat, Visual Disturbances and Yellow Eyes. Respiratory Present- Snoring. Not Present- Bloody sputum, Chronic Cough, Difficulty Breathing and Wheezing. Breast Present- Breast Pain. Not Present- Breast Mass, Nipple Discharge and Skin Changes. Cardiovascular Present- Palpitations and Swelling of Extremities. Not Present- Chest Pain, Difficulty Breathing Lying Down, Leg Cramps, Rapid Heart Rate and Shortness of Breath. Gastrointestinal Present- Abdominal Pain, Constipation, Nausea and Vomiting. Not Present- Bloating, Bloody Stool, Change in Bowel Habits, Chronic diarrhea, Difficulty  Swallowing, Excessive gas, Gets full quickly at meals, Hemorrhoids, Indigestion and Rectal Pain. Musculoskeletal Present- Back Pain and Muscle Weakness. Not Present- Joint Pain, Joint Stiffness, Muscle Pain and Swelling of Extremities. Neurological Present- Headaches, Numbness and Seizures. Not Present- Decreased Memory, Fainting, Tingling, Tremor, Trouble walking and Weakness.  Vitals   Weight: 197 lb Height: 66in Body Surface Area: 1.99 m Body Mass Index: 31.8 kg/m  Temp.: 54F(Temporal)  Pulse: 79 (Regular)  BP: 128/78 (Sitting, Left Arm, Standard)       Physical Exam  General Mental Status-Alert. General Appearance-Not in acute distress. Build & Nutrition-Well nourished. Posture-Normal posture. Gait-Normal. Note: BMI 31.8   Head and Neck Head-normocephalic, atraumatic with no lesions or palpable masses. Trachea-midline. Thyroid Gland Characteristics - normal size and consistency and no palpable nodules.  Chest and Lung Exam Chest and lung exam reveals -on auscultation, normal breath sounds, no adventitious sounds and normal vocal resonance.  Breast Note: Breasts are medium to large in size and soft and nontender. There is no sign of infection or drainage. There are reduction mammoplasty scars in the usual location, circumareolar, inframammary, and linear at 6:00. There is a small linear density that both his out from the areolar margin on the left at the 9 o'clock position and there may be an extension of this superior medially. Very small in caliber. No bigger than a 4-0 Prolene suture. It is hard to be sure what this is. No other mass in either breast. No axillary adenopathy.   Cardiovascular Cardiovascular examination reveals -normal heart sounds, regular rate and rhythm with no murmurs and femoral artery auscultation bilaterally reveals normal pulses, no bruits, no thrills.  Abdomen Inspection Inspection of the abdomen reveals -  No Hernias. Palpation/Percussion Palpation and Percussion of the abdomen reveal - Soft, Non Tender, No Rigidity (guarding), No hepatosplenomegaly and No Palpable abdominal masses. Note: Lots of well-healed scars from her multiple procedures.   Neurologic Neurologic evaluation reveals -alert and oriented x 3 with no impairment of recent or remote memory, normal attention span and ability to concentrate, normal  sensation and normal coordination.  Musculoskeletal Normal Exam - Bilateral-Upper Extremity Strength Normal and Lower Extremity Strength Normal.    Assessment & Plan  FOREIGN BODY OF BREAST, LEFT, INITIAL ENCOUNTER (S20.152A) Current Plans Schedule for Surgery It is possible that you have retained suture material in the periareolar area of your left breast, but I am not sure that this is the case. We cannot be sure this is the case because it is unusual to have nonabsorbable suture materials in this area following breast reduction Nevertheless we can see something in the subcutaneous tissue. One option is to explore the left nipple area through a incision as I described it to see if we can remove this material. Hyoutated he would like to do that The diagnosis is not clear. It is also not clear whether your breast will feel better after this. We have discussed the indications, techniques, and numerous risk of the surgery.  This will be outpatient surgery and someone will need to drive you home  Pt Education - Pamphlet Given - Breast Biopsy: discussed with patient and provided information. MULTIPLE SCLEROSIS (G35) PSEUDOTUMOR CEREBRI (G93.2) HISTORY OF GASTRIC BYPASS (Z98.890) HISTORY OF TOTAL ABDOMINAL HYSTERECTOMY AND BILATERAL SALPINGO-OOPHORECTOMY (Z90.710)   Angelia Mould. Derrell Lolling, M.D., [facility] General and Minimally invasive Surgery Breast and Colorectal Surgery Office:   [phone] Pager:   [phone]

## 2015-04-12 ENCOUNTER — Encounter (HOSPITAL_BASED_OUTPATIENT_CLINIC_OR_DEPARTMENT_OTHER): Payer: Self-pay | Admitting: Anesthesiology

## 2015-04-12 ENCOUNTER — Ambulatory Visit (HOSPITAL_BASED_OUTPATIENT_CLINIC_OR_DEPARTMENT_OTHER): Payer: Federal, State, Local not specified - PPO | Admitting: Anesthesiology

## 2015-04-12 ENCOUNTER — Encounter (HOSPITAL_BASED_OUTPATIENT_CLINIC_OR_DEPARTMENT_OTHER)
Admission: RE | Disposition: A | Discharge status: Home / Self Care | Payer: Self-pay | Source: Ambulatory Visit | Attending: General Surgery

## 2015-04-12 ENCOUNTER — Ambulatory Visit (HOSPITAL_BASED_OUTPATIENT_CLINIC_OR_DEPARTMENT_OTHER)
Admission: RE | Admit: 2015-04-12 | Discharge: 2015-04-12 | Disposition: A | Discharge status: Home / Self Care | Payer: Federal, State, Local not specified - PPO | Source: Ambulatory Visit | Attending: General Surgery | Admitting: General Surgery

## 2015-04-12 DIAGNOSIS — Z79890 Hormone replacement therapy (postmenopausal): Secondary | ICD-10-CM | POA: Insufficient documentation

## 2015-04-12 DIAGNOSIS — Y838 Other surgical procedures as the cause of abnormal reaction of the patient, or of later complication, without mention of misadventure at the time of the procedure: Secondary | ICD-10-CM | POA: Insufficient documentation

## 2015-04-12 DIAGNOSIS — Z9884 Bariatric surgery status: Secondary | ICD-10-CM | POA: Insufficient documentation

## 2015-04-12 DIAGNOSIS — S20152A Superficial foreign body of breast, left breast, initial encounter: Secondary | ICD-10-CM | POA: Diagnosis not present

## 2015-04-12 DIAGNOSIS — G40909 Epilepsy, unspecified, not intractable, without status epilepticus: Secondary | ICD-10-CM | POA: Insufficient documentation

## 2015-04-12 DIAGNOSIS — Z90722 Acquired absence of ovaries, bilateral: Secondary | ICD-10-CM | POA: Insufficient documentation

## 2015-04-12 DIAGNOSIS — R011 Cardiac murmur, unspecified: Secondary | ICD-10-CM | POA: Insufficient documentation

## 2015-04-12 DIAGNOSIS — F419 Anxiety disorder, unspecified: Secondary | ICD-10-CM | POA: Insufficient documentation

## 2015-04-12 DIAGNOSIS — F329 Major depressive disorder, single episode, unspecified: Secondary | ICD-10-CM | POA: Insufficient documentation

## 2015-04-12 DIAGNOSIS — Z9079 Acquired absence of other genital organ(s): Secondary | ICD-10-CM | POA: Insufficient documentation

## 2015-04-12 DIAGNOSIS — G35 Multiple sclerosis: Secondary | ICD-10-CM | POA: Insufficient documentation

## 2015-04-12 DIAGNOSIS — Z90710 Acquired absence of both cervix and uterus: Secondary | ICD-10-CM | POA: Insufficient documentation

## 2015-04-12 DIAGNOSIS — Z79899 Other long term (current) drug therapy: Secondary | ICD-10-CM | POA: Insufficient documentation

## 2015-04-12 DIAGNOSIS — G43909 Migraine, unspecified, not intractable, without status migrainosus: Secondary | ICD-10-CM | POA: Insufficient documentation

## 2015-04-12 DIAGNOSIS — M1990 Unspecified osteoarthritis, unspecified site: Secondary | ICD-10-CM | POA: Insufficient documentation

## 2015-04-12 HISTORY — PX: BREAST BIOPSY: SHX20

## 2015-04-12 SURGERY — BREAST BIOPSY
Anesthesia: General | Site: Breast | Laterality: Left

## 2015-04-12 MED ORDER — FENTANYL CITRATE (PF) 100 MCG/2ML IJ SOLN: 50.0000 ug | INTRAMUSCULAR | Status: DC | PRN

## 2015-04-12 MED ORDER — DEXAMETHASONE SODIUM PHOSPHATE 4 MG/ML IJ SOLN
INTRAMUSCULAR | Status: DC | PRN
  Administered 2015-04-12: 10 mg via INTRAVENOUS

## 2015-04-12 MED ORDER — LACTATED RINGERS IV SOLN
INTRAVENOUS | Status: DC
  Administered 2015-04-12: 13:00:00 via INTRAVENOUS

## 2015-04-12 MED ORDER — MIDAZOLAM HCL 5 MG/5ML IJ SOLN
INTRAMUSCULAR | Status: DC | PRN
  Administered 2015-04-12: 2 mg via INTRAVENOUS

## 2015-04-12 MED ORDER — PROPOFOL 10 MG/ML IV BOLUS
INTRAVENOUS | Status: AC
  Filled 2015-04-12: qty 40

## 2015-04-12 MED ORDER — CEFAZOLIN SODIUM-DEXTROSE 2-3 GM-% IV SOLR
2.0000 g | INTRAVENOUS | Status: AC
  Administered 2015-04-12: 2 g via INTRAVENOUS

## 2015-04-12 MED ORDER — FENTANYL CITRATE (PF) 100 MCG/2ML IJ SOLN
INTRAMUSCULAR | Status: AC
  Filled 2015-04-12: qty 2

## 2015-04-12 MED ORDER — ONDANSETRON HCL 4 MG/2ML IJ SOLN
INTRAMUSCULAR | Status: AC
  Filled 2015-04-12: qty 2

## 2015-04-12 MED ORDER — ONDANSETRON HCL 4 MG/2ML IJ SOLN
INTRAMUSCULAR | Status: DC | PRN
  Administered 2015-04-12: 4 mg via INTRAVENOUS

## 2015-04-12 MED ORDER — EPHEDRINE SULFATE 50 MG/ML IJ SOLN
INTRAMUSCULAR | Status: AC
  Filled 2015-04-12: qty 1

## 2015-04-12 MED ORDER — GLYCOPYRROLATE 0.2 MG/ML IJ SOLN
INTRAMUSCULAR | Status: AC
  Filled 2015-04-12: qty 1

## 2015-04-12 MED ORDER — DEXAMETHASONE SODIUM PHOSPHATE 10 MG/ML IJ SOLN
INTRAMUSCULAR | Status: AC
  Filled 2015-04-12: qty 1

## 2015-04-12 MED ORDER — MIDAZOLAM HCL 2 MG/2ML IJ SOLN: 1.0000 mg | INTRAMUSCULAR | Status: DC | PRN

## 2015-04-12 MED ORDER — BUPIVACAINE-EPINEPHRINE (PF) 0.5% -1:200000 IJ SOLN
INTRAMUSCULAR | Status: AC
  Filled 2015-04-12: qty 30

## 2015-04-12 MED ORDER — PROPOFOL 10 MG/ML IV BOLUS
INTRAVENOUS | Status: DC | PRN
  Administered 2015-04-12: 180 mg via INTRAVENOUS

## 2015-04-12 MED ORDER — MIDAZOLAM HCL 2 MG/2ML IJ SOLN
INTRAMUSCULAR | Status: AC
  Filled 2015-04-12: qty 2

## 2015-04-12 MED ORDER — ATROPINE SULFATE 0.4 MG/ML IJ SOLN
INTRAMUSCULAR | Status: AC
  Filled 2015-04-12: qty 1

## 2015-04-12 MED ORDER — CHLORHEXIDINE GLUCONATE 4 % EX LIQD: 1.0000 "application " | Freq: Once | CUTANEOUS | Status: DC

## 2015-04-12 MED ORDER — GLYCOPYRROLATE 0.2 MG/ML IJ SOLN: 0.2000 mg | Freq: Once | INTRAMUSCULAR | Status: DC | PRN

## 2015-04-12 MED ORDER — LIDOCAINE HCL (CARDIAC) 20 MG/ML IV SOLN
INTRAVENOUS | Status: DC | PRN
  Administered 2015-04-12: 60 mg via INTRAVENOUS

## 2015-04-12 MED ORDER — TRAMADOL HCL 50 MG PO TABS: 50.0000 mg | ORAL_TABLET | Freq: Four times a day (QID) | ORAL | Status: DC | PRN

## 2015-04-12 MED ORDER — FENTANYL CITRATE (PF) 100 MCG/2ML IJ SOLN
INTRAMUSCULAR | Status: DC | PRN
  Administered 2015-04-12: 100 ug via INTRAVENOUS
  Administered 2015-04-12 (×2): 50 ug via INTRAVENOUS

## 2015-04-12 MED ORDER — SUCCINYLCHOLINE CHLORIDE 20 MG/ML IJ SOLN
INTRAMUSCULAR | Status: AC
  Filled 2015-04-12: qty 1

## 2015-04-12 MED ORDER — PHENYLEPHRINE HCL 10 MG/ML IJ SOLN
INTRAMUSCULAR | Status: AC
  Filled 2015-04-12: qty 1

## 2015-04-12 MED ORDER — FENTANYL CITRATE (PF) 100 MCG/2ML IJ SOLN: 25.0000 ug | INTRAMUSCULAR | Status: DC | PRN

## 2015-04-12 MED ORDER — BUPIVACAINE-EPINEPHRINE 0.5% -1:200000 IJ SOLN
INTRAMUSCULAR | Status: DC | PRN
  Administered 2015-04-12: 7 mL

## 2015-04-12 MED ORDER — SCOPOLAMINE 1 MG/3DAYS TD PT72: 1.0000 | MEDICATED_PATCH | Freq: Once | TRANSDERMAL | Status: DC | PRN

## 2015-04-12 MED ORDER — LIDOCAINE HCL (CARDIAC) 20 MG/ML IV SOLN
INTRAVENOUS | Status: AC
  Filled 2015-04-12: qty 5

## 2015-04-12 SURGICAL SUPPLY — 58 items
APPLIER CLIP 9.375 MED OPEN (MISCELLANEOUS)
BANDAGE ACE 6X5 VEL STRL LF (GAUZE/BANDAGES/DRESSINGS) IMPLANT
BENZOIN TINCTURE PRP APPL 2/3 (GAUZE/BANDAGES/DRESSINGS) ×3 IMPLANT
BLADE HEX COATED 2.75 (ELECTRODE) ×3 IMPLANT
BLADE SURG 15 STRL LF DISP TIS (BLADE) ×1 IMPLANT
BLADE SURG 15 STRL SS (BLADE) ×2
CANISTER SUCT 1200ML W/VALVE (MISCELLANEOUS) IMPLANT
CHLORAPREP W/TINT 26ML (MISCELLANEOUS) ×3 IMPLANT
CLIP APPLIE 9.375 MED OPEN (MISCELLANEOUS) IMPLANT
CLOSURE WOUND 1/2 X4 (GAUZE/BANDAGES/DRESSINGS) ×1
COVER BACK TABLE 60X90IN (DRAPES) ×3 IMPLANT
COVER MAYO STAND STRL (DRAPES) ×3 IMPLANT
DECANTER SPIKE VIAL GLASS SM (MISCELLANEOUS) IMPLANT
DERMABOND ADVANCED (GAUZE/BANDAGES/DRESSINGS)
DERMABOND ADVANCED .7 DNX12 (GAUZE/BANDAGES/DRESSINGS) IMPLANT
DEVICE DUBIN W/COMP PLATE 8390 (MISCELLANEOUS) IMPLANT
DRAPE LAPAROSCOPIC ABDOMINAL (DRAPES) IMPLANT
DRAPE LAPAROTOMY 100X72 PEDS (DRAPES) ×3 IMPLANT
DRAPE LAPAROTOMY TRNSV 102X78 (DRAPE) IMPLANT
DRAPE UTILITY XL STRL (DRAPES) ×3 IMPLANT
ELECT REM PT RETURN 9FT ADLT (ELECTROSURGICAL) ×3
ELECTRODE REM PT RTRN 9FT ADLT (ELECTROSURGICAL) ×1 IMPLANT
GAUZE SPONGE 4X4 16PLY XRAY LF (GAUZE/BANDAGES/DRESSINGS) IMPLANT
GLOVE BIOGEL M STRL SZ7.5 (GLOVE) ×3 IMPLANT
GLOVE EUDERMIC 7 POWDERFREE (GLOVE) ×3 IMPLANT
GLOVE EXAM NITRILE EXT CUFF MD (GLOVE) ×3 IMPLANT
GOWN STRL REUS W/ TWL LRG LVL3 (GOWN DISPOSABLE) IMPLANT
GOWN STRL REUS W/ TWL XL LVL3 (GOWN DISPOSABLE) ×2 IMPLANT
GOWN STRL REUS W/TWL LRG LVL3 (GOWN DISPOSABLE)
GOWN STRL REUS W/TWL XL LVL3 (GOWN DISPOSABLE) ×4
KIT MARKER MARGIN INK (KITS) IMPLANT
NEEDLE HYPO 22GX1.5 SAFETY (NEEDLE) IMPLANT
NEEDLE HYPO 25X1 1.5 SAFETY (NEEDLE) ×3 IMPLANT
NS IRRIG 1000ML POUR BTL (IV SOLUTION) IMPLANT
PACK BASIN DAY SURGERY FS (CUSTOM PROCEDURE TRAY) ×3 IMPLANT
PENCIL BUTTON HOLSTER BLD 10FT (ELECTRODE) ×3 IMPLANT
SLEEVE SCD COMPRESS KNEE MED (MISCELLANEOUS) ×3 IMPLANT
SPONGE GAUZE 4X4 12PLY STER LF (GAUZE/BANDAGES/DRESSINGS) ×3 IMPLANT
SPONGE LAP 4X18 X RAY DECT (DISPOSABLE) ×3 IMPLANT
STAPLER VISISTAT 35W (STAPLE) IMPLANT
STRIP CLOSURE SKIN 1/2X4 (GAUZE/BANDAGES/DRESSINGS) ×2 IMPLANT
SUT ETHILON 4 0 PS 2 18 (SUTURE) IMPLANT
SUT MNCRL AB 4-0 PS2 18 (SUTURE) IMPLANT
SUT SILK 2 0 SH (SUTURE) ×3 IMPLANT
SUT VIC AB 2-0 SH 27 (SUTURE)
SUT VIC AB 2-0 SH 27XBRD (SUTURE) IMPLANT
SUT VIC AB 3-0 FS2 27 (SUTURE) IMPLANT
SUT VIC AB 4-0 P-3 18XBRD (SUTURE) IMPLANT
SUT VIC AB 4-0 P3 18 (SUTURE)
SUT VICRYL 3-0 CR8 SH (SUTURE) ×3 IMPLANT
SUT VICRYL 4-0 PS2 18IN ABS (SUTURE) ×3 IMPLANT
SYR BULB 3OZ (MISCELLANEOUS) IMPLANT
SYRINGE 10CC LL (SYRINGE) ×3 IMPLANT
TAPE HYPAFIX 4 X10 (GAUZE/BANDAGES/DRESSINGS) IMPLANT
TOWEL OR NON WOVEN STRL DISP B (DISPOSABLE) ×3 IMPLANT
TUBE CONNECTING 20'X1/4 (TUBING) ×1
TUBE CONNECTING 20X1/4 (TUBING) ×2 IMPLANT
YANKAUER SUCT BULB TIP NO VENT (SUCTIONS) ×3 IMPLANT

## 2015-04-12 NOTE — Anesthesia Preprocedure Evaluation (Signed)
Anesthesia Evaluation  Patient identified by MRN, date of birth, ID band Patient awake    Reviewed: Allergy & Precautions, NPO status , Patient's Chart, lab work & pertinent test results  History of Anesthesia Complications Negative for: history of anesthetic complications  Airway Mallampati: II  TM Distance: >3 FB Neck ROM: Full    Dental  (+) Teeth Intact   Pulmonary    breath sounds clear to auscultation       Cardiovascular + Valvular Problems/Murmurs MR  Rhythm:Regular  TTE 7/15: Left ventricle: The cavity size was normal. Wall thickness was normal. Systolic function was normal. The estimated ejection fraction was in the range of 60% to 65%. Wall motion was normal; there were no regional wall motion abnormalities.     Neuro/Psych  Headaches, Seizures -, Well Controlled,  Multiple sclerosis, pseudotumor cerebri   Neuromuscular disease negative psych ROS   GI/Hepatic negative GI ROS, Neg liver ROS,   Endo/Other  negative endocrine ROS  Renal/GU negative Renal ROS     Musculoskeletal   Abdominal   Peds  Hematology negative hematology ROS (+)   Anesthesia Other Findings   Reproductive/Obstetrics                             Anesthesia Physical Anesthesia Plan  ASA: II  Anesthesia Plan: General   Post-op Pain Management:    Induction: Intravenous  Airway Management Planned: LMA  Additional Equipment: None  Intra-op Plan:   Post-operative Plan: Extubation in OR  Informed Consent: I have reviewed the patients History and Physical, chart, labs and discussed the procedure including the risks, benefits and alternatives for the proposed anesthesia with the patient or authorized representative who has indicated his/her understanding and acceptance.   Dental advisory given  Plan Discussed with: CRNA and Surgeon  Anesthesia Plan Comments:         Anesthesia Quick  Evaluation

## 2015-04-12 NOTE — Anesthesia Procedure Notes (Signed)
Procedure Name: LMA Insertion Date/Time: 04/12/2015 2:08 PM Performed by: Genevieve Norlander L Pre-anesthesia Checklist: Patient identified, Emergency Drugs available, Suction available, Patient being monitored and Timeout performed Patient Re-evaluated:Patient Re-evaluated prior to inductionOxygen Delivery Method: Circle System Utilized Preoxygenation: Pre-oxygenation with 100% oxygen Intubation Type: IV induction Ventilation: Mask ventilation without difficulty LMA: LMA inserted LMA Size: 4.0 Number of attempts: 1 Airway Equipment and Method: Bite block Placement Confirmation: positive ETCO2 Tube secured with: Tape Dental Injury: Teeth and Oropharynx as per pre-operative assessment

## 2015-04-12 NOTE — Interval H&P Note (Signed)
The patient is brought to the operating room electively today for exploration of left breast for possible foreign body removal.  I discussed the indications, details, techniques and numerous risks with the patient.  All of her questions are answered.  She understands all these issues.  She agrees with this plan.  Angelia Mould. Derrell Lolling, M.D., [facility] General and Minimally invasive Surgery Breast and Colorectal Surgery Office:   [phone] Pager:   [phone]

## 2015-04-12 NOTE — Transfer of Care (Signed)
Immediate Anesthesia Transfer of Care Note  Patient: Joyce Lang  Procedure(s) Performed: Procedure(s): EXPLORATION LEFT BREAST FOR FORGIN BODY REMOVAL (Left)  Patient Location: PACU  Anesthesia Type:General  Level of Consciousness: awake and patient cooperative  Airway & Oxygen Therapy: Patient Spontanous Breathing and Patient connected to face mask oxygen  Post-op Assessment: Report given to RN and Post -op Vital signs reviewed and stable  Post vital signs: Reviewed and stable  Last Vitals:  Filed Vitals:   04/12/15 1236  BP: 104/66  Pulse: 87  Temp: 36.6 C  Resp: 20    Complications: No apparent anesthesia complications

## 2015-04-12 NOTE — Discharge Instructions (Signed)
No sports, heavy lifting or strenuous activities for 2 weeks You may drive your car in 2 days You may shower, but do not take a tub bath  Ice pack for 10 minutes at a time, twice an hour  Change the bandage daily and as necessary You may wear a bra if it is comfortable  Leave the Steri-Strip tapes in place until they fall off  See Dr. Derrell Lolling in 3 weeks for a wound check    Post Anesthesia Home Care Instructions  Activity: Get plenty of rest for the remainder of the day. A responsible adult should stay with you for 24 hours following the procedure.  For the next 24 hours, DO NOT: -Drive a car -Advertising copywriter -Drink alcoholic beverages -Take any medication unless instructed by your physician -Make any legal decisions or sign important papers.  Meals: Start with liquid foods such as gelatin or soup. Progress to regular foods as tolerated. Avoid greasy, spicy, heavy foods. If nausea and/or vomiting occur, drink only clear liquids until the nausea and/or vomiting subsides. Call your physician if vomiting continues.  Special Instructions/Symptoms: Your throat may feel dry or sore from the anesthesia or the breathing tube placed in your throat during surgery. If this causes discomfort, gargle with warm salt water. The discomfort should disappear within 24 hours.  If you had a scopolamine patch placed behind your ear for the management of post- operative nausea and/or vomiting:  1. The medication in the patch is effective for 72 hours, after which it should be removed.  Wrap patch in a tissue and discard in the trash. Wash hands thoroughly with soap and water. 2. You may remove the patch earlier than 72 hours if you experience unpleasant side effects which may include dry mouth, dizziness or visual disturbances. 3. Avoid touching the patch. Wash your hands with soap and water after contact with the patch.   Call your surgeon if you experience:   1.  Fever over 101.0. 2.  Inability  to urinate. 3.  Nausea and/or vomiting. 4.  Extreme swelling or bruising at the surgical site. 5.  Continued bleeding from the incision. 6.  Increased pain, redness or drainage from the incision. 7.  Problems related to your pain medication. 8. Any change in color, movement and/or sensation 9. Any problems and/or concerns

## 2015-04-12 NOTE — Op Note (Signed)
Patient Name:           Joyce Lang   Date of Surgery:        04/12/2015  Pre op Diagnosis:      Possible foreign body left breast, periareolar area  Post op Diagnosis:    Foreign body left breast, periareolar area.  Suspect Prolene suture  Procedure:                 Exploration left breast, removal of foreign body  Surgeon:                     Angelia Mould. Derrell Lolling, M.D., FACS  Assistant:                      OR staff  Operative Indications:     This is a 45 year old African American female, referred by Dr. Annia Belt at the breast center [facility] for evaluation and management of what is thought to be retained suture material in the left breast periareolar area.      The patient gives a history of bilateral breast reduction when she was in the [employer] in 1999. She says post op they pulled a suture out of the inframammary area but does not remember any sutures in the periareolar area. About 6 months ago she felt a linear density in the left periareolar area starting medially and extending more superiorly. There's been no drainage or induration or infection.      She had bilateral mammograms and ultrasound left breast on March 23, 2015 obtained. Category B density. There were no radiographic abnormalities but Dr. Earlene Plater felt a linear density and he theorized that this was suture material.      Comorbidities include multiple sclerosis, pseudotumor cerebri, strong family history Cancer, migraine headaches, GERD, history of gastric bypass, history cholecystectomy and appendectomy, history total abdominal hysterectomy with bilateral oophorectomy. She takes hormone replacement therapy.       She is frustrated by the discomfort and is somewhat anxious about what is going on and she really wants this area explored. I explained the uncertainty. I explained the risk of bleeding, infection, ischemia to the nipple and areolar skin, nerve damage with chronic pain making the pain worse. I told her I  really wasn't sure of the diagnosis and not sure whether her discomfort would get better but that we can explore this area seemed reasonable. I told her this might be retained suture material or might simply be a subdermal vein that was thrombosed.  She is aware that there may be retained foreign body even after the surgery. All of her questions are answered. She understands all these issues. She agrees with this plan. A plan to do this under LMA general anesthesia and make a circumareolar incision starting at 3:00 and extending superiorly as necessary.  Operative Findings:       In the dermis of the left breast, areolar area medially and superiorly, there was a blue linear density consistent with a Prolene suture.  This was  small in caliber suggesting 4-0 or 5-0 Prolene.  This came out intact and slid out easily and once this was done  there was no more visible abnormality.  I felt that I had removed all of the foreign body.  Procedure in Detail:          Following the induction of general LMA anesthesia the patient's left breast was prepped and draped in a sterile fashion.  Surgical  timeout was performed.  Intravenous antibiotic given.  0.5% Marcaine with epinephrine was used as a local infiltration anesthetic.  I could visualize the thin serpiginous dermal density.  I made incision at the areolar margin medially and went down into the dermis.  Within the dermis there was a blue string-like material approximately 6 cm long.  When I grasped this is slid out very easily in one piece and I saw no other retained material under the skin.  This was sent to the lab for gross documentation.  Hemostasis was excellent.  I chose not to place any sutures but simply placed Steri-Strips to close the small incision.  Clean bandages were placed and the patient taken to PACU in stable condition.  EBL less than 5 mL.  Counts correct.  Complications none.     Angelia Mould. Derrell Lolling, M.D., FACS General and Minimally  Invasive Surgery Breast and Colorectal Surgery  04/12/2015 2:30 PM

## 2015-04-13 ENCOUNTER — Encounter (HOSPITAL_BASED_OUTPATIENT_CLINIC_OR_DEPARTMENT_OTHER): Payer: Self-pay | Admitting: General Surgery

## 2015-04-13 NOTE — Anesthesia Postprocedure Evaluation (Signed)
Anesthesia Post Note  Patient: Joyce Lang  Procedure(s) Performed: Procedure(s) (LRB): EXPLORATION LEFT BREAST, REMOVAL OF  FORGEIN BODY  (Left)  Patient location during evaluation: PACU Anesthesia Type: General Level of consciousness: awake Pain management: pain level controlled Vital Signs Assessment: post-procedure vital signs reviewed and stable Respiratory status: spontaneous breathing and respiratory function stable Cardiovascular status: stable Postop Assessment: no signs of nausea or vomiting Anesthetic complications: no    Last Vitals:  Filed Vitals:   04/12/15 1500 04/12/15 1530  BP:  102/67  Pulse: 81 85  Temp:  36.4 C  Resp: 20 20    Last Pain:  Filed Vitals:   04/12/15 1542  PainSc: 0-No pain                 Syria Kestner

## 2015-04-14 NOTE — Progress Notes (Signed)
Quick Note:  Inform patient of Pathology report,. Tell her that the official pathology report documents suture like material that we removed from her breast.  hmi ______

## 2015-05-23 ENCOUNTER — Other Ambulatory Visit: Payer: Self-pay | Admitting: Specialist

## 2015-05-23 DIAGNOSIS — G35 Multiple sclerosis: Secondary | ICD-10-CM

## 2015-05-27 ENCOUNTER — Ambulatory Visit
Admission: RE | Admit: 2015-05-27 | Discharge: 2015-05-27 | Disposition: A | Discharge status: Home / Self Care | Payer: Federal, State, Local not specified - PPO | Source: Ambulatory Visit | Attending: Specialist | Admitting: Specialist

## 2015-05-27 DIAGNOSIS — G35 Multiple sclerosis: Secondary | ICD-10-CM

## 2015-05-27 MED ORDER — GADOBENATE DIMEGLUMINE 529 MG/ML IV SOLN
17.0000 mL | Freq: Once | INTRAVENOUS | Status: AC | PRN
  Administered 2015-05-27: 17 mL via INTRAVENOUS

## 2015-06-27 MED FILL — HYDROCODON-APAP 10-325: 10-325 | 30 days supply | Qty: 120 | Fill #0

## 2015-08-02 MED FILL — HYDROCODON-APAP 10-325: 10-325 | 30 days supply | Qty: 180 | Fill #0

## 2016-12-31 IMAGING — CT CT ABD-PELV W/ CM
2 of 5 series · 16 of 46 positions shown, 18 images · IV contrast (APPLIED)
Comparison: 12/20/2013 CT Abdomen and Pelvis and earlier

CLINICAL DATA: 43-year-old female with abdomen and right flank pain
for 1 week. Vomiting and diarrhea. Previous abdominal surgery
including gastric bypass. Initial encounter.

EXAM:
CT ABDOMEN AND PELVIS WITH CONTRAST
TECHNIQUE: Multidetector CT imaging of the abdomen and pelvis was performed
using the standard protocol following bolus administration of
intravenous contrast.
CONTRAST:  100mL OMNIPAQUE IOHEXOL 300 MG/ML  SOLN

[Series 2: abd/pelvis 5.0 b31f · axial · 0.80mm/px · z∈[-786,-386]mm · 13 of 91 slices shown, 15 images]
[im 6/91  soft-tissue]
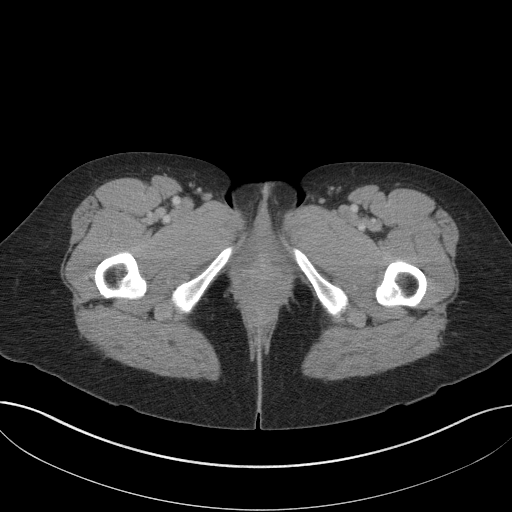
[im 6/91  bone]
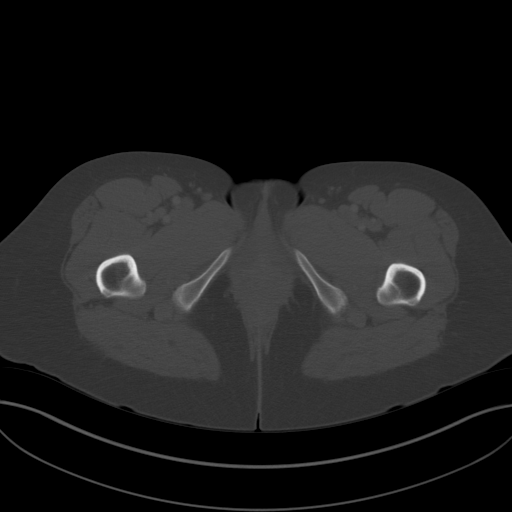
[im 11/91  soft-tissue]
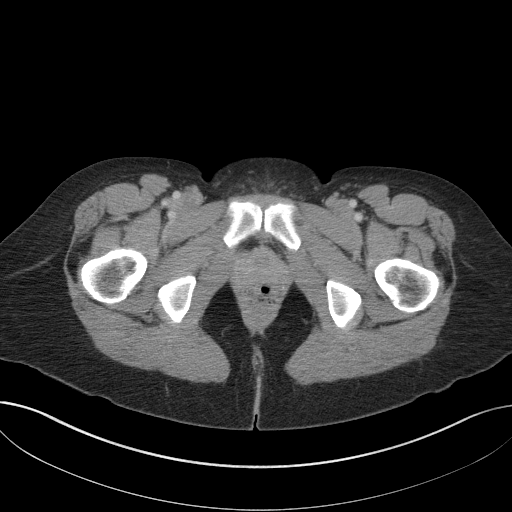
[im 21/91  soft-tissue]
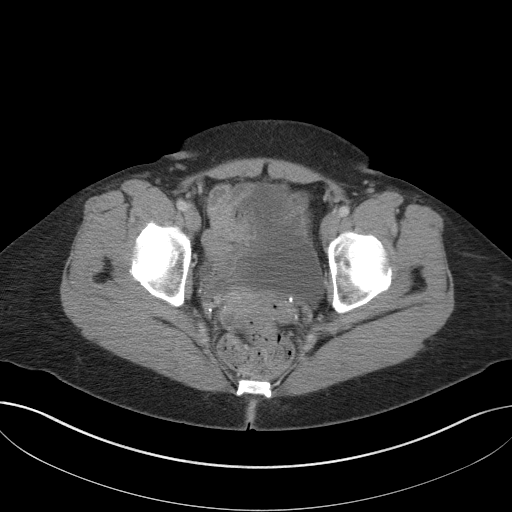
[im 26/91  soft-tissue]
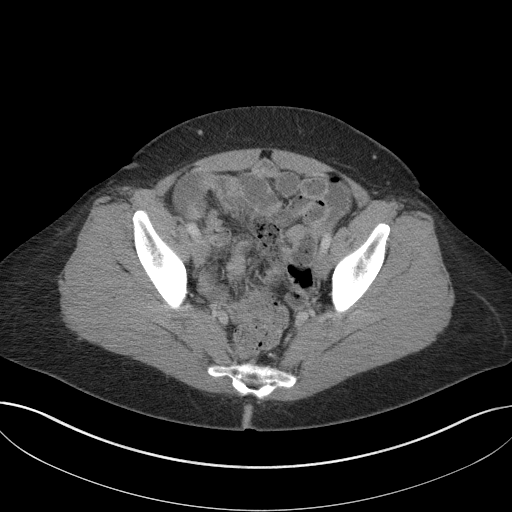
[im 31/91  soft-tissue]
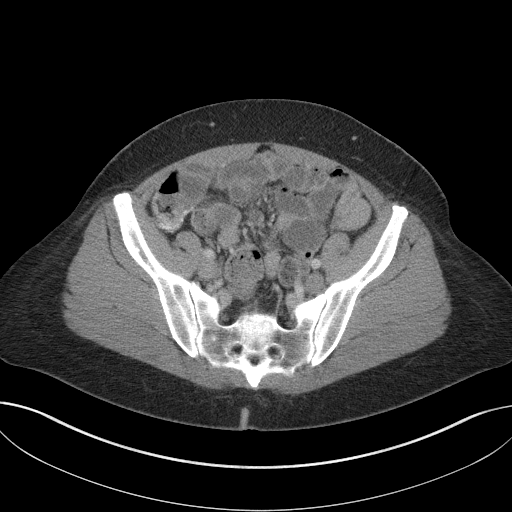
[im 41/91  soft-tissue]
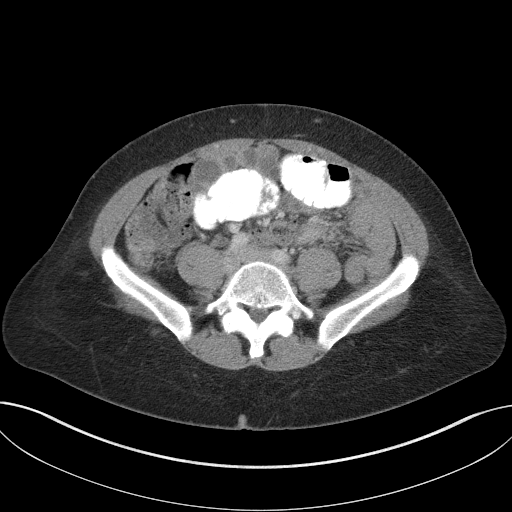
[im 46/91  soft-tissue]
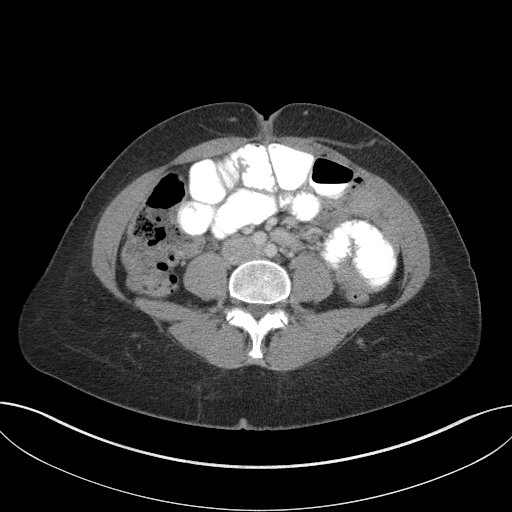
[im 51/91  soft-tissue]
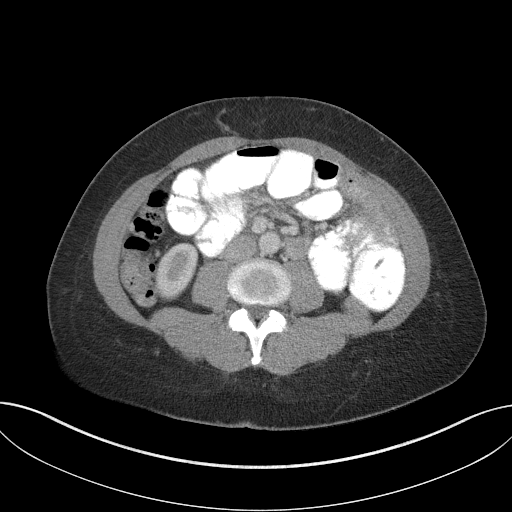
[im 61/91  soft-tissue]
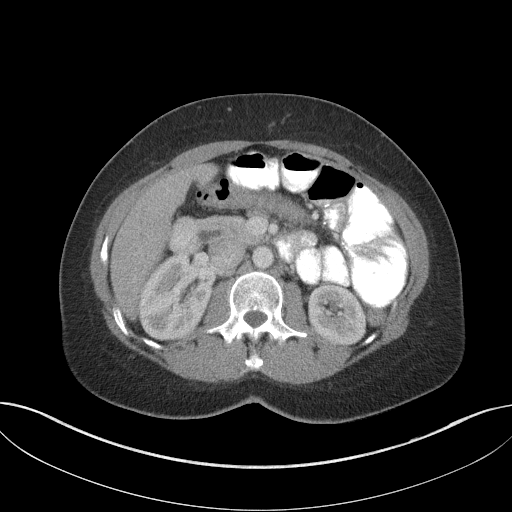
[im 61/91  bone]
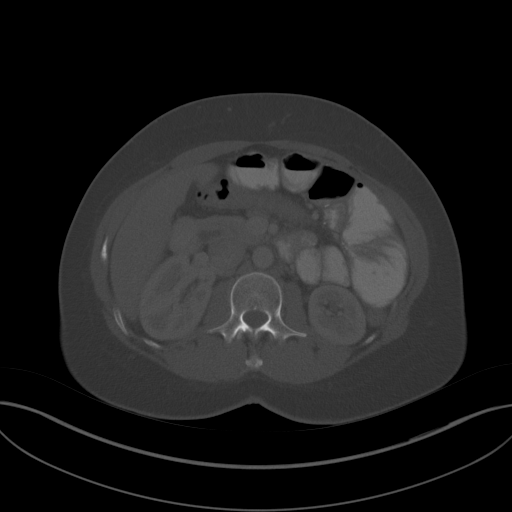
[im 66/91  soft-tissue]
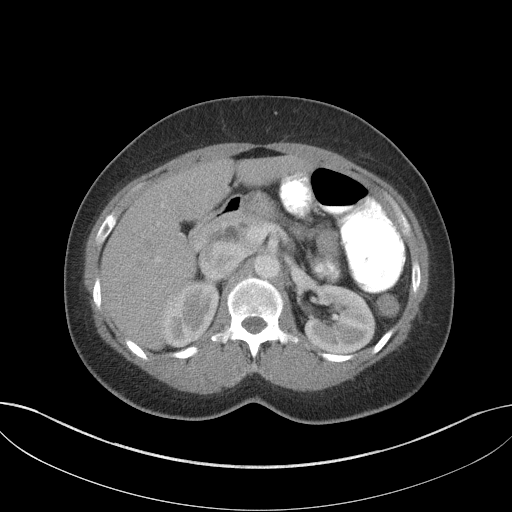
[im 71/91  soft-tissue]
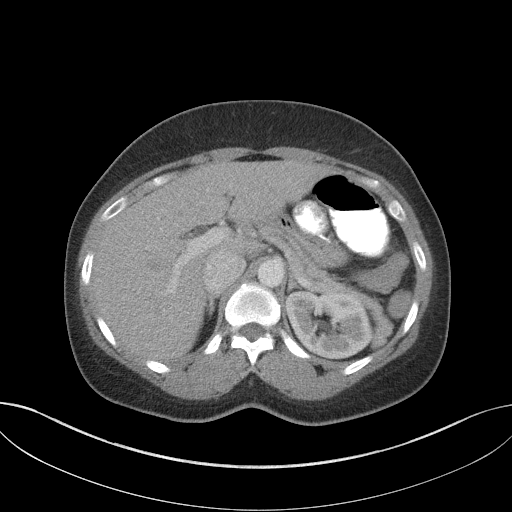
[im 81/91  soft-tissue]
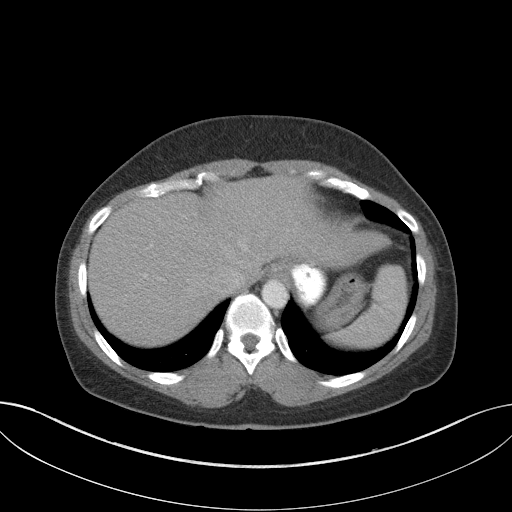
[im 86/91  soft-tissue]
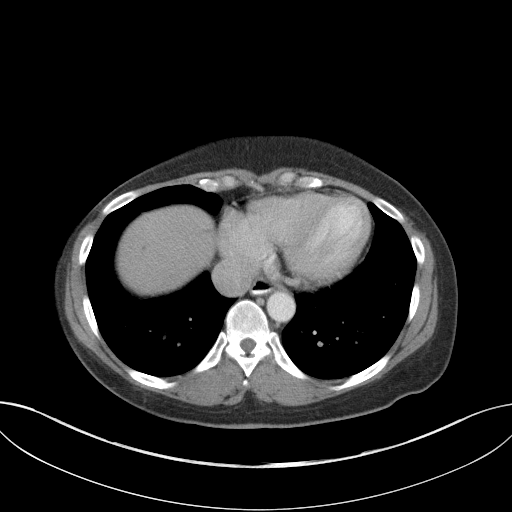

[Series 5: abd/pelvis 3.0 coronal · coronal · 0.85mm/px · 3 of 89 slices shown]
[im 30/89  soft-tissue]
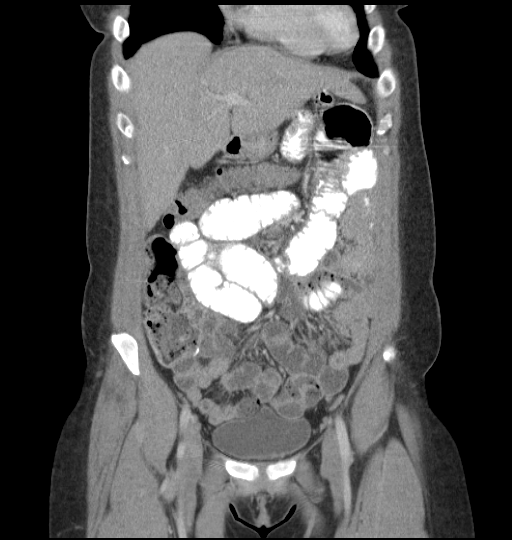
[im 40/89  soft-tissue]
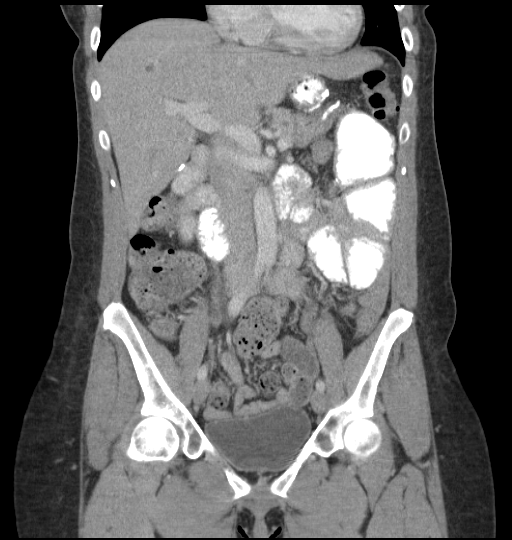
[im 49/89  soft-tissue]
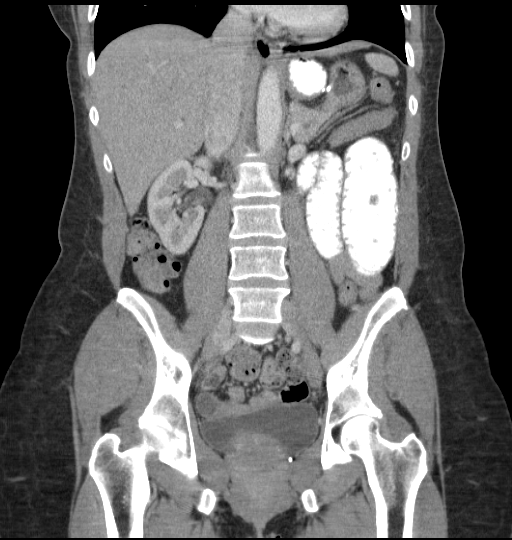

[16 of 46 positions shown; findings below may reference images not displayed]

FINDINGS: Mild cardiomegaly is stable. Negative lung bases. No pericardial or
pleural effusion.

No acute osseous abnormality identified.

Increased stool in the distal colon. No abdominal free fluid. Uterus
surgically absent. Adnexal may also be surgically absent.
Unremarkable bladder.

Mildly redundant sigmoid colon with retained stool. Negative left
colon, splenic flexure, transverse colon and right colon. Sequelae
of appendectomy. Most distal small bowel loops are fluid-filled but
not abnormally dilated. There is some loculated material in some
distal small bowel loops. Oral contrast was administered. Patent
gastrojejunostomy. The proximal small bowel is patulous in
appearance, including at the small bowel anastomosis in the left
upper quadrant, but oral contrast has reached more distal nondilated
loops. The bypassed stomach is decompressed. There is a trace amount
of contrast in the duodenum which appears normal.

Gallbladder surgically absent. Stable extrahepatic biliary ductal
enlargement. Stable and negative liver parenchyma. Spleen, pancreas,
main pancreatic duct, adrenal glands, and kidneys are stable. Renal
contrast excretion is within normal limits. No abdominal free fluid
or free air. No bowel wall thickening or mesenteric inflammation.
Portal venous system is patent. Major arterial structures are within
normal limits.
IMPRESSION: 1. Upper limits of normal to mildly dilated small bowel loops but
with no transition point or strong evidence of bowel obstruction.
Perhaps the appearance reflects a small bowel ileus.
2. Otherwise no acute or inflammatory process identified in the
abdomen or pelvis, with extensive postoperative changes re-
identified.

## 2022-12-25 ENCOUNTER — Other Ambulatory Visit (HOSPITAL_COMMUNITY): Payer: Self-pay | Admitting: Internal Medicine

## 2022-12-25 DIAGNOSIS — R609 Edema, unspecified: Secondary | ICD-10-CM

## 2022-12-26 ENCOUNTER — Ambulatory Visit (HOSPITAL_COMMUNITY)
Admission: RE | Admit: 2022-12-26 | Discharge: 2022-12-26 | Disposition: A | Discharge status: Home / Self Care | Payer: No Typology Code available for payment source | Source: Ambulatory Visit | Attending: Vascular Surgery | Admitting: Vascular Surgery

## 2022-12-26 DIAGNOSIS — R609 Edema, unspecified: Secondary | ICD-10-CM | POA: Diagnosis present

## 2023-01-08 ENCOUNTER — Ambulatory Visit: Payer: Federal, State, Local not specified - PPO | Admitting: Vascular Surgery

## 2023-01-08 VITALS — BP 97/71 | HR 82 | Temp 97.8°F

## 2023-01-08 DIAGNOSIS — I872 Venous insufficiency (chronic) (peripheral): Secondary | ICD-10-CM

## 2023-01-08 NOTE — Progress Notes (Signed)
[facility] [place]  ASSESSMENT / PLAN: Joyce Lang is a 52 y.o. female with chronic venous insufficiency of bilateral lower extremities causing swelling (C3 disease).  Venous duplex is significant for focal areas of right greater saphenous vein reflux Recommend compression and elevation for symptomatic relief. Follow up with me as needed.  CHIEF COMPLAINT: leg swelling  HISTORY OF PRESENT ILLNESS: Joyce Lang is a 52 y.o. female referred to clinic for evaluation of lower extremity swelling.  The patient has long struggled with lower extremity swelling.  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  She has been maintained on lifelong anticoagulation with Eliquis.  Her swelling is manageable with compression socks, which she already wears fairly religiously when working.  We reviewed her duplex in detail today.  Past Medical History:  Diagnosis Date   Iron deficiency anemia 10/18/2013   Migraine    Mitral valve prolapse    Molar pregnancy    MS (multiple sclerosis) (HCC)    Pseudotumor cerebri    PTSD (post-traumatic stress disorder)    Seizures (HCC)    10/2014 last seizure   Trigeminal neuralgia     Past Surgical History:  Procedure Laterality Date   APPENDECTOMY     BILATERAL SALPINGOOPHORECTOMY     BREAST BIOPSY Left 04/12/2015   Procedure: EXPLORATION LEFT BREAST, REMOVAL OF  FORGEIN BODY ;  Surgeon: Claud Kelp, MD;  Location: [facility];  Service: General;  Laterality: Left;   BREAST SURGERY     CARPAL TUNNEL RELEASE     CHOLECYSTECTOMY     GASTRIC BYPASS     TONSILLECTOMY     VAGINAL HYSTERECTOMY      Family History  Problem Relation Age of Onset   Cancer Maternal Aunt        Colon Cancer    Alcohol abuse Maternal Uncle    Cancer Paternal Grandfather        Colon Cancer    Social History    Socioeconomic History   Marital status: Single    Spouse name: Not on file   Number of children: 1   Years of education: Not on file   Highest education level: Not on file  Occupational History   Occupation: VSR    Employer: [employer]  Tobacco Use   Smoking status: Never   Smokeless tobacco: Never  Substance and Sexual Activity   Alcohol use: Yes    Comment: social   Drug use: No   Sexual activity: Not Currently    Birth control/protection: Surgical  Other Topics Concern   Not on file  Social History Narrative   Marital Status:  Divorced  Casimiro Needle)    Children:  Daughter Danley Danker) Son Danton Clap - Suicide 11/13)    Pets: Cat (Max)   Living Situation: Lives with daughter.     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[employer] [place]    Tobacco Use/Exposure:  None    Alcohol Use:  Occasional   Drug Use:  None   Diet:  Regular   Exercise:  None   Hobbies: Reading, Surveyor, minerals, Dancing   Social Determinants of Health   Financial Resource Strain: Not on file  Food Insecurity: Not on file  Transportation Needs: Not on file  Physical Activity: Not on file  Stress: Not on file  Social Connections: Not on file  Intimate  Partner Violence: Not on file    Allergies  Allergen Reactions   Demerol Hives   Morphine And Codeine Hives    States she is not allergic to MS   Nucynta [Tapentadol] Hives   Percocet [Oxycodone-Acetaminophen] Other (See Comments)    "jittery, I don't know where I am"   Vicodin [Hydrocodone-Acetaminophen] Other (See Comments)    "jittery, I don't know where I am"    Current Outpatient Medications  Medication Sig Dispense Refill   acetaZOLAMIDE ER (DIAMOX) 500 MG capsule Take 500 mg by mouth 2 (two) times daily.     acyclovir (ZOVIRAX) 400 MG tablet Take 800 mg by mouth 2 (two) times daily.     apixaban (ELIQUIS) 5 MG TABS tablet Take 2.5 mg by mouth 2 (two) times daily.     BIOTIN PO Take 1 tablet by mouth daily.     calcium citrate  (CALCITRATE - DOSED IN MG ELEMENTAL CALCIUM) 950 (200 Ca) MG tablet Take 400 mg of elemental calcium by mouth 2 (two) times daily.     dalfampridine 10 MG TB12 Take 1 tablet by mouth 2 (two) times daily.     diclofenac Sodium (VOLTAREN) 1 % GEL Apply 4 g topically 4 (four) times daily.     Dimethyl Fumarate (TECFIDERA) 240 MG CPDR Take 240 mg by mouth 2 (two) times daily.      EPINEPHrine 0.3 mg/0.3 mL IJ SOAJ injection Inject 0.3 mg into the muscle as needed for anaphylaxis (Bee).     estradiol (VIVELLE-DOT) 0.05 MG/24HR patch Place 1 patch onto the skin 2 (two) times a week.     hydrOXYzine (VISTARIL) 25 MG capsule Take 25 mg by mouth at bedtime.     lidocaine (LIDODERM) 5 % Place 1 patch onto the skin daily.     magnesium (MAGTAB) 84 MG ( ) TBCR SR tablet Take 1 tablet by mouth daily.     methocarbamol (ROBAXIN) 750 MG tablet Take 1,500 mg by mouth 3 (three) times daily.     miconazole (MICOTIN) 2 % cream Apply 1 Application topically 2 (two) times daily.     modafinil (PROVIGIL) 200 MG tablet Take 200 mg by mouth daily.     naloxone (NARCAN) nasal spray 4 mg/0.1 mL Place 1 spray into the nose once.     omeprazole (PRILOSEC) 40 MG capsule Take 40 mg by mouth daily.     potassium chloride SA (KLOR-CON M) 20 MEQ tablet Take 20 mEq by mouth once.     pregabalin (LYRICA) 300 MG capsule Take 300 mg by mouth 2 (two) times daily.     prochlorperazine (COMPAZINE) 5 MG tablet Take 5 mg by mouth every 6 (six) hours as needed for nausea or vomiting.     progesterone (PROMETRIUM) 100 MG capsule Take 100 mg by mouth at bedtime.     spironolactone (ALDACTONE) 25 MG tablet Take 25 mg by mouth once.     traZODone (DESYREL) 150 MG tablet Take 150 mg by mouth at bedtime.     b complex vitamins capsule Take 2 capsules by mouth daily. (Patient not taking: Reported on 01/08/2023)     carisoprodol (SOMA) 350 MG tablet Take 350 mg by mouth 4 (four) times daily as needed for muscle spasms. (Patient not taking:  Reported on 01/08/2023)     clonazePAM (KLONOPIN) 0.5 MG tablet Take 1.5 mg by mouth 3 (three) times daily.  (Patient not taking: Reported on 01/08/2023)     estradiol (ESTRACE) 0.5 MG tablet Take  0.5 mg by mouth daily. (Patient not taking: Reported on 01/08/2023)     furosemide (LASIX) 40 MG tablet Take 40 mg by mouth. (Patient not taking: Reported on 01/08/2023)     Multiple Vitamin (MULTIVITAMIN WITH MINERALS) TABS tablet Take 1 tablet by mouth at bedtime.  (Patient not taking: Reported on 01/08/2023)     nortriptyline (PAMELOR) 50 MG capsule Take 50 mg by mouth at bedtime. (Patient not taking: Reported on 01/08/2023)     polyethylene glycol (MIRALAX / GLYCOLAX) packet Take 17 g by mouth daily. (Patient not taking: Reported on 01/08/2023) 14 each 0   SUMAtriptan (IMITREX) 100 MG tablet Take 100 mg by mouth every 2 (two) hours as needed for migraine or headache.  (Patient not taking: Reported on 01/08/2023)     Vitamin D, Ergocalciferol, (DRISDOL) 50000 UNITS CAPS capsule Take 50,000 Units by mouth every Sunday. (Patient not taking: Reported on 01/08/2023)     No current facility-administered medications for this visit.    PHYSICAL EXAM Vitals:   01/08/23 0838  BP: 97/71  Pulse: 82  Temp: 97.8 F (36.6 C)  TempSrc: Temporal  SpO2: 98%   Well-appearing middle-aged woman in no distress Regular rate and rhythm Unlabored breathing Lower extremities with compression socks.  No significant swelling 2+ dorsalis pedis pulses  PERTINENT LABORATORY AND RADIOLOGIC DATA  Most recent CBC    Latest Ref Rng & Units 04/10/2015   11:45 AM 03/01/2015   11:27 PM 01/11/2015    9:20 AM  CBC  WBC 4.0 - 10.5 K/uL 8.2  12.5  7.7   Hemoglobin 12.0 - 15.0 g/dL 16.1  09.6  04.5   Hematocrit 36.0 - 46.0 % 40.0  39.2  40.6   Platelets 150 - 400 K/uL 189  199  197      Most recent CMP    Latest Ref Rng & Units 04/10/2015   11:45 AM 03/01/2015   11:27 PM 01/11/2015    9:20 AM  CMP  Glucose 65 - 99 mg/dL 409   811  914   BUN 6 - 20 mg/dL 9  13  12    Creatinine 0.44 - 1.00 mg/dL 7.82  9.56  2.13   Sodium 135 - 145 mmol/L 144  142  142   Potassium 3.5 - 5.1 mmol/L 3.3  3.6  3.8   Chloride 101 - 111 mmol/L 111  117  113   CO2 22 - 32 mmol/L 21  18  25    Calcium 8.9 - 10.3 mg/dL 8.9  8.5  8.7   Total Protein 6.5 - 8.1 g/dL 6.8  7.0  6.8   Total Bilirubin 0.3 - 1.2 mg/dL 0.8  0.4  0.2   Alkaline Phos 38 - 126 U/L 53  66  53   AST 15 - 41 U/L 23  32  20   ALT 14 - 54 U/L 18  35  19     Ayssa Bentivegna N. Lenell Antu, MD [facility] Phone Number: [phone] 01/08/2023 10:36 AM   Total time spent on preparing this encounter including chart review, data review, collecting history, examining the patient, coordinating care for this new patient, 45 minutes.  Portions of this report may have been transcribed using voice recognition software.  Every effort has been made to ensure accuracy; however, inadvertent computerized transcription errors may still be present.

## 2023-01-27 ENCOUNTER — Ambulatory Visit: Payer: Federal, State, Local not specified - PPO

## 2023-01-27 ENCOUNTER — Ambulatory Visit
Admission: EM | Admit: 2023-01-27 | Discharge: 2023-01-27 | Disposition: A | Discharge status: Home / Self Care | Payer: No Typology Code available for payment source | Attending: Family Medicine | Admitting: Family Medicine

## 2023-01-27 DIAGNOSIS — M778 Other enthesopathies, not elsewhere classified: Secondary | ICD-10-CM | POA: Diagnosis not present

## 2023-01-27 MED ORDER — PREDNISONE 20 MG PO TABS: 20.0000 mg | ORAL_TABLET | Freq: Every day | ORAL | 0 refills | Status: AC

## 2023-01-27 NOTE — ED Provider Notes (Signed)
Joyce Lang    CSN: [id number] Arrival date & time: 01/27/23  1744      History   Chief Complaint Chief Complaint  Patient presents with   Hand Problem    HPI Joyce Lang is a 52 y.o. female.   HPI Patient with a history of carpal tunnel and DeQuervain's Tendonosis since today with right thumb pain x 1 month and popping and clicking with movement of the thumb.  Patient reports recently having carpal tunnel revision surgery in July 2024.  She reports approximately 1 month ago she began developing symptoms of clicking and popping with movement of her thumb. Over the last few days pain has worsened. Patient reports a fall two months ago in which she landed on her hand and is uncertain if she fracture it. Denies numbness or tingling involving the right hand. Endorses the hand feel more cooler than baseline. 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/27/2014   Trigeminal neuralgia    Seizures (HCC)    Pseudotumor cerebri    MS (multiple sclerosis) (HCC)    Iron deficiency anemia 10/18/2013    Past Surgical History:  Procedure Laterality Date   APPENDECTOMY     BILATERAL SALPINGOOPHORECTOMY     BREAST BIOPSY Left 04/12/2015   Procedure: EXPLORATION LEFT BREAST, REMOVAL OF  FORGEIN BODY ;  Surgeon: Claud Kelp, MD;  Location: [facility];  Service: General;  Laterality: Left;   BREAST SURGERY     CARPAL TUNNEL RELEASE     CHOLECYSTECTOMY     GASTRIC  BYPASS     TONSILLECTOMY     VAGINAL HYSTERECTOMY      OB History   No obstetric history on file.      Home Medications    Prior to Admission medications   Medication Sig Start Date End Date Taking? Authorizing Provider  predniSONE (DELTASONE) 20 MG tablet Take 1 tablet (20 mg total) by mouth daily with breakfast for 5 days. 01/27/23 02/01/23 Yes Bing Neighbors, NP  acetaZOLAMIDE ER (DIAMOX) 500 MG capsule Take 500 mg by mouth 2 (two) times daily.    [provider]  acyclovir (ZOVIRAX) 400 MG tablet Take 800 mg by mouth 2 (two) times daily. 01/29/13   [provider]  apixaban (ELIQUIS) 5 MG TABS tablet Take 2.5 mg by mouth 2 (two) times daily. 12/22/22   [provider]  b complex vitamins capsule Take 2 capsules by mouth daily. Patient not taking: Reported on 01/08/2023    [provider]  BIOTIN PO Take 1 tablet by mouth daily.    [provider]  calcium citrate (CALCITRATE - DOSED IN MG ELEMENTAL CALCIUM) 950 (200 Ca) MG tablet Take 400 mg of elemental calcium by mouth 2 (two) times daily. 12/17/22   [provider]  carisoprodol (SOMA) 350 MG tablet Take 350 mg by mouth 4 (four) times daily as needed for  muscle spasms. Patient not taking: Reported on 01/08/2023    [provider]  clonazePAM (KLONOPIN) 0.5 MG tablet Take 1.5 mg by mouth 3 (three) times daily.  Patient not taking: Reported on 01/08/2023    [provider]  dalfampridine 10 MG TB12 Take 1 tablet by mouth 2 (two) times daily. 12/17/22   [provider]  diclofenac Sodium (VOLTAREN) 1 % GEL Apply 4 g topically 4 (four) times daily. 05/31/22   [provider]  Dimethyl Fumarate (TECFIDERA) 240 MG CPDR Take 240 mg by mouth 2 (two) times daily.     [provider]  EPINEPHrine 0.3 mg/0.3 mL IJ SOAJ injection Inject 0.3 mg into the muscle as needed for anaphylaxis (Bee). 12/17/22   [provider]  estradiol  (ESTRACE) 0.5 MG tablet Take 0.5 mg by mouth daily. Patient not taking: Reported on 01/08/2023    [provider]  estradiol (VIVELLE-DOT) 0.05 MG/24HR patch Place 1 patch onto the skin 2 (two) times a week. 12/17/22   [provider]  furosemide (LASIX) 40 MG tablet Take 40 mg by mouth. Patient not taking: Reported on 01/08/2023    [provider]  hydrOXYzine (VISTARIL) 25 MG capsule Take 25 mg by mouth at bedtime. 07/16/22   [provider]  lidocaine (LIDODERM) 5 % Place 1 patch onto the skin daily. 06/19/22   [provider]  magnesium (MAGTAB) 84 MG ( ) TBCR SR tablet Take 1 tablet by mouth daily. 12/17/22   [provider]  methocarbamol (ROBAXIN) 750 MG tablet Take 1,500 mg by mouth 3 (three) times daily. 10/30/22   [provider]  miconazole (MICOTIN) 2 % cream Apply 1 Application topically 2 (two) times daily. 01/31/22   [provider]  modafinil (PROVIGIL) 200 MG tablet Take 200 mg by mouth daily. 12/17/22   [provider]  Multiple Vitamin (MULTIVITAMIN WITH MINERALS) TABS tablet Take 1 tablet by mouth at bedtime.  Patient not taking: Reported on 01/08/2023    [provider]  naloxone [facility]) nasal spray 4 mg/0.1 mL Place 1 spray into the nose once. 12/17/22   [provider]  nortriptyline (PAMELOR) 50 MG capsule Take 50 mg by mouth at bedtime. Patient not taking: Reported on 01/08/2023    [provider]  omeprazole (PRILOSEC) 40 MG capsule Take 40 mg by mouth daily. 12/17/22   [provider]  polyethylene glycol (MIRALAX / GLYCOLAX) packet Take 17 g by mouth daily. Patient not taking: Reported on 01/08/2023 05/04/14   Sciacca, Ashok Cordia, PA-C  potassium chloride SA (KLOR-CON M) 20 MEQ tablet Take 20 mEq by mouth once. 08/26/22   [provider]  pregabalin (LYRICA) 300 MG capsule Take 300 mg by mouth 2 (two) times daily. 12/18/22   [provider]   prochlorperazine (COMPAZINE) 5 MG tablet Take 5 mg by mouth every 6 (six) hours as needed for nausea or vomiting. 08/13/22   [provider]  progesterone (PROMETRIUM) 100 MG capsule Take 100 mg by mouth at bedtime. 12/17/22   [provider]  spironolactone (ALDACTONE) 25 MG tablet Take 25 mg by mouth once. 12/17/22   [provider]  SUMAtriptan (IMITREX) 100 MG tablet Take 100 mg by mouth every 2 (two) hours as needed for migraine or headache.  Patient not taking: Reported on 01/08/2023    [provider]  traZODone (DESYREL) 150 MG tablet Take 150 mg by mouth at bedtime. 12/30/22   [provider]  Vitamin D, Ergocalciferol, (  DRISDOL) 50000 UNITS CAPS capsule Take 50,000 Units by mouth every Sunday. Patient not taking: Reported on 01/08/2023    [provider]    Family History Family History  Problem Relation Age of Onset   Cancer Maternal Aunt        Colon Cancer    Alcohol abuse Maternal Uncle    Cancer Paternal Grandfather        Colon Cancer    Social History Social History   Tobacco Use   Smoking status: Never   Smokeless tobacco: Never  Substance Use Topics   Alcohol use: Yes    Comment: social   Drug use: No     Allergies   Demerol, Morphine and codeine, Nucynta [tapentadol], Percocet [oxycodone-acetaminophen], and Vicodin [hydrocodone-acetaminophen]   Review of Systems Review of Systems Pertinent negatives listed in HPI   Physical Exam Triage Vital Signs ED Triage Vitals  Encounter Vitals Group     BP 01/27/23 1839 116/74     Systolic BP Percentile --      Diastolic BP Percentile --      Pulse Rate 01/27/23 1839 76     Resp 01/27/23 1839 18     Temp 01/27/23 1839 97.7 F (36.5 C)     Temp src --      SpO2 01/27/23 1839 97 %     Weight --      Height --      Head Circumference --      Peak Flow --      Pain Score 01/27/23 1854 8     Pain Loc --      Pain Education --      Exclude from Growth  Chart --    No data found.  Updated Vital Signs BP 116/74   Pulse 76   Temp 97.7 F (36.5 C)   Resp 18   SpO2 97%   Visual Acuity Right Eye Distance:   Left Eye Distance:   Bilateral Distance:    Right Eye Near:   Left Eye Near:    Bilateral Near:     Physical Exam Constitutional:      Appearance: Normal appearance.  HENT:     Head: Normocephalic and atraumatic.  Eyes:     Pupils: Pupils are equal, round, and reactive to light.  Cardiovascular:     Rate and Rhythm: Normal rate and regular rhythm.  Pulmonary:     Effort: Pulmonary effort is normal.     Breath sounds: Normal breath sounds.  Musculoskeletal:     Right hand: Decreased range of motion. Decreased strength of thumb/finger opposition.     Left hand: Normal.     Comments: Reduced ROM right thumb   Skin:    General: Skin is warm.  Neurological:     General: No focal deficit present.     Mental Status: She is alert.      UC Treatments / Results  Labs (all labs ordered are listed, but only abnormal results are displayed) Labs Reviewed - No data to display  EKG   Radiology DG Hand 2 View Right  Result Date: 01/27/2023 CLINICAL DATA:  Thumb pain and clicking. History of carpal tunnel revision. EXAM: RIGHT HAND - 2 VIEW COMPARISON:  None Available. FINDINGS: Single view is submitted. There is no evidence of fracture or dislocation. There is no evidence of arthropathy or other focal bone abnormality. Soft tissues are unremarkable. IMPRESSION: Negative. Electronically Signed   By: Mcneil Sober.D.  On: 01/27/2023 20:05    Procedures Procedures (including critical care time)  Medications Ordered in UC Medications - No data to display  Initial Impression / Assessment and Plan / UC Course  I have reviewed the triage vital signs and the nursing notes.  Pertinent labs & imaging results that were available during my care of the patient were reviewed by me and considered in my medical decision making  (see chart for details).    Tinnitus involving the right thumb, continue splinting.  Patient is on lifelong anticoagulant therapy therefore will treat with prednisone to relieve inflammation of the patient is in significant pain.  Provided information to follow up with [facility] Dr. Amanda Pea hand specialist for further evaluation and workup of symptoms.  Patient reports she has not had any imaging of her right hand is concerned that she may have possibly injured a few months ago prior to the onset of symptoms.  Obtained 2 views of the right hand.  Imaging has not resolved prior to discharge advised that if any significant findings are present she will be notified via my chart Final Clinical Impressions(s) / UC Diagnoses   Final diagnoses:  Tendinitis of thumb     Discharge Instructions      Continue splinting. I recommend follow-up with hand specialist as you may require joint injections.  Take Tylenol arthritis strength for pain.  Treating the inflammation which is causing the pain with prednisone 20 mg once daily for 5 days.  Take prednisone with food.  Follow up with [facility] hand specialist Dr. Amanda Pea. Once her x-ray has been read we will update you with the results via my chart.     ED Prescriptions     Medication Sig Dispense Auth. Provider   predniSONE (DELTASONE) 20 MG tablet Take 1 tablet (20 mg total) by mouth daily with breakfast for 5 days. 5 tablet Bing Neighbors, NP      PDMP not reviewed this encounter.   Bing Neighbors, NP 01/29/23 1622

## 2023-01-27 NOTE — Discharge Instructions (Addendum)
Continue splinting. I recommend follow-up with hand specialist as you may require joint injections.  Take Tylenol arthritis strength for pain.  Treating the inflammation which is causing the pain with prednisone 20 mg once daily for 5 days.  Take prednisone with food.  Follow up with [facility] hand specialist Dr. Amanda Pea. Once her x-ray has been read we will update you with the results via my chart.

## 2023-01-27 NOTE — ED Triage Notes (Signed)
Patient to Urgent Care with complaints of right sided hand pain. Reports pain around the base of her thumb. When she moves her thumb she feels it pop and click. Reports she had a carpal tunnel surgery in July. Pain worse when hand is cold. Denies any known injury.  Symptoms started approx 1 month ago. Pain has significantly worsened over the last week.   Has been applying Voltaren gel/ splinting it with a thumb spica at night.

## 2023-08-02 ENCOUNTER — Other Ambulatory Visit: Payer: Self-pay

## 2023-08-02 ENCOUNTER — Emergency Department (HOSPITAL_COMMUNITY)
Admission: EM | Admit: 2023-08-02 | Discharge: 2023-08-02 | Disposition: A | Discharge status: Home / Self Care | Attending: Emergency Medicine | Admitting: Emergency Medicine

## 2023-08-02 ENCOUNTER — Encounter (HOSPITAL_COMMUNITY): Payer: Self-pay

## 2023-08-02 ENCOUNTER — Emergency Department (HOSPITAL_COMMUNITY)

## 2023-08-02 DIAGNOSIS — Z7901 Long term (current) use of anticoagulants: Secondary | ICD-10-CM | POA: Diagnosis not present

## 2023-08-02 DIAGNOSIS — K5900 Constipation, unspecified: Secondary | ICD-10-CM | POA: Diagnosis present

## 2023-08-02 LAB — COMPREHENSIVE METABOLIC PANEL WITH GFR
ALT: 22 U/L (ref 0–44)
AST: 21 U/L (ref 15–41)
Albumin: 4.2 g/dL (ref 3.5–5.0)
Alkaline Phosphatase: 34 U/L — ABNORMAL LOW (ref 38–126)
Anion gap: 10 (ref 5–15)
BUN: 10 mg/dL (ref 6–20)
CO2: 22 mmol/L (ref 22–32)
Calcium: 9.3 mg/dL (ref 8.9–10.3)
Chloride: 104 mmol/L (ref 98–111)
Creatinine, Ser: 1.01 mg/dL — ABNORMAL HIGH (ref 0.44–1.00)
GFR, Estimated: >60 mL/min (ref >60)
Glucose, Bld: 90 mg/dL (ref 70–99)
Potassium: 3.8 mmol/L (ref 3.5–5.1)
Sodium: 136 mmol/L (ref 135–145)
Total Bilirubin: 0.6 mg/dL (ref 0.0–1.2)
Total Protein: 6.4 g/dL — ABNORMAL LOW (ref 6.5–8.1)

## 2023-08-02 LAB — PROTIME-INR
INR: 1.4 — ABNORMAL HIGH (ref 0.8–1.2)
Prothrombin Time: 17.7 s — ABNORMAL HIGH (ref 11.4–15.2)

## 2023-08-02 LAB — URINALYSIS, ROUTINE W REFLEX MICROSCOPIC
Bilirubin Urine: NEGATIVE
Glucose, UA: NEGATIVE mg/dL
Ketones, ur: NEGATIVE mg/dL
Leukocytes,Ua: NEGATIVE
Nitrite: NEGATIVE
Protein, ur: NEGATIVE mg/dL
Specific Gravity, Urine: 1.005 (ref 1.005–1.030)
pH: 6 (ref 5.0–8.0)

## 2023-08-02 LAB — CBC
HCT: 40.3 % (ref 36.0–46.0)
Hemoglobin: 13.1 g/dL (ref 12.0–15.0)
MCH: 31.4 pg (ref 26.0–34.0)
MCHC: 32.5 g/dL (ref 30.0–36.0)
MCV: 96.6 fL (ref 80.0–100.0)
Platelets: 185 10*3/uL (ref 150–400)
RBC: 4.17 MIL/uL (ref 3.87–5.11)
RDW: 13.2 % (ref 11.5–15.5)
WBC: 9.1 10*3/uL (ref 4.0–10.5)
nRBC: 0 % (ref 0.0–0.2)

## 2023-08-02 LAB — LIPASE, BLOOD: Lipase: 27 U/L (ref 11–51)

## 2023-08-02 MED ORDER — ONDANSETRON HCL 4 MG/2ML IJ SOLN
4.0000 mg | Freq: Once | INTRAMUSCULAR | Status: AC
  Administered 2023-08-02: 4 mg via INTRAVENOUS
  Filled 2023-08-02: qty 2

## 2023-08-02 MED ORDER — LINACLOTIDE 72 MCG PO CAPS: 72.0000 ug | ORAL_CAPSULE | Freq: Every day | ORAL | 1 refills | Status: AC

## 2023-08-02 MED ORDER — IOHEXOL 350 MG/ML SOLN
100.0000 mL | Freq: Once | INTRAVENOUS | Status: AC | PRN
Start: 2023-08-02 — End: 2023-08-02
  Administered 2023-08-02: 100 mL via INTRAVENOUS

## 2023-08-02 MED ORDER — HYDROMORPHONE HCL 1 MG/ML IJ SOLN
1.0000 mg | Freq: Once | INTRAMUSCULAR | Status: AC
  Administered 2023-08-02: 1 mg via INTRAVENOUS
  Filled 2023-08-02: qty 1

## 2023-08-02 NOTE — ED Provider Notes (Signed)
 Joyce Lang Provider Note   CSN: [id number] Arrival date & time: 08/02/23  0745     History  Chief Complaint  Patient presents with   Emesis   Constipation    Joyce Lang is a 53 y.o. female.  Pt complains of severe abdominal pain since yesterday.  Pt reports nausea and constipation.  Pt reports Joyce Lang has taken percocet for discomfort without relief.  Pt denies any fever or chills.  No uti symptoms.  No diarrhea.  Pt reports Joyce Lang has had a bowel obstruction and a perforated ulcer in the past.  Patient reports that the pain that Joyce Lang is having is similar to the pain that Joyce Lang had when Joyce Lang had a bowel obstruction.  Patient reports that Joyce Lang worked last night and began having severe pain.  Patient reports that Joyce Lang has been having constipation for several days.  Patient has a past medical history of blood clots and is on Eliquis.  Patient states that Joyce Lang has a family history of clotting disorder.  Patient states Joyce Lang has not been diagnosed with a clotting disorder.  The history is provided by the patient. No language interpreter was used.  Emesis Constipation Associated symptoms: vomiting        Home Medications Prior to Admission medications   Medication Sig Start Date End Date Taking? Authorizing Provider  acetaZOLAMIDE  ER (DIAMOX ) 500 MG capsule Take 500 mg by mouth 2 (two) times daily.   Yes [provider]  acyclovir  (ZOVIRAX ) 400 MG tablet Take 800 mg by mouth 2 (two) times daily. 01/29/13  Yes [provider]  apixaban (ELIQUIS) 5 MG TABS tablet Take 2.5 mg by mouth 2 (two) times daily. 12/22/22  Yes [provider]  b complex vitamins capsule Take 1 capsule by mouth 2 (two) times daily.   Yes [provider]  Cholecalciferol (VITAMIN D3) 250 MCG (10000 UT) capsule Take 10,000 Units by mouth daily.   Yes [provider]  cyanocobalamin (VITAMIN B12) 1000 MCG/ML injection Inject 1,000 mcg into  the muscle once a week. 06/17/23  Yes [provider]  dalfampridine (AMPYRA) 10 MG TB12 Take 10 mg by mouth 2 (two) times daily.   Yes [provider]  diclofenac Sodium (VOLTAREN) 1 % GEL Apply 4 g topically 4 (four) times daily as needed (pain). 05/31/22  Yes [provider]  Dimethyl Fumarate  (TECFIDERA ) 240 MG CPDR Take 240 mg by mouth 2 (two) times daily.    Yes [provider]  eletriptan (RELPAX) 40 MG tablet Take 40 mg by mouth daily as needed for migraine. 03/13/23  Yes [provider]  EPINEPHrine  0.3 mg/0.3 mL IJ SOAJ injection Inject 0.3 mg into the muscle as needed for anaphylaxis (Bee). 12/17/22  Yes [provider]  escitalopram (LEXAPRO) 10 MG tablet Take 10 mg by mouth. 07/29/23  Yes [provider]  estradiol  (VIVELLE -DOT) 0.05 MG/24HR patch Place 1 patch onto the skin See admin instructions. Apply 1 patch to the skin twice weekly, Monday and Thursday 12/17/22  Yes [provider]  Estradiol  (YUVAFEM ) 10 MCG TABS vaginal tablet Place 10 mcg vaginally See admin instructions. Insert 1 tablet ( ) vaginally twice a week, Monday and Thursday   Yes [provider]  hydrOXYzine  (VISTARIL ) 25 MG capsule Take 25 mg by mouth at bedtime. 07/16/22  Yes [provider]  lidocaine  (LIDODERM ) 5 % Place 1 patch onto the skin daily as needed (pain). 06/19/22  Yes [provider]  magnesium (MAGTAB) 84 MG ( ) TBCR SR tablet Take 84 mg by mouth daily. 12/17/22  Yes [provider]  methocarbamol (ROBAXIN) 750 MG tablet Take 1,500 mg by mouth 3 (three) times daily. 10/30/22  Yes [provider]  miconazole (MICOTIN) 2 % cream Apply 1 Application topically 2 (two) times daily as needed (skin irritation). 01/31/22  Yes [provider]  modafinil (PROVIGIL) 200 MG tablet Take 200 mg by mouth daily. 12/17/22  Yes [provider]  Multiple Vitamins-Minerals (MULTIVITAMIN WITH  MINERALS) tablet Take 1 tablet by mouth daily.   Yes [provider]  naloxone (NARCAN) nasal spray 4 mg/0.1 mL Place 1 spray into the nose once. 12/17/22  Yes [provider]  omeprazole  (PRILOSEC) 40 MG capsule Take 40 mg by mouth 2 (two) times daily. 12/17/22  Yes [provider]  ondansetron  (ZOFRAN -ODT) 4 MG disintegrating tablet Take 4 mg by mouth every 8 (eight) hours as needed for vomiting or nausea. 07/29/23  Yes [provider]  oxyCODONE -acetaminophen  (PERCOCET/ROXICET) 5-325 MG tablet Take 1 tablet by mouth 4 (four) times daily. 07/29/23  Yes [provider]  Glynda Lash (OYSTER CALCIUM PO) Take 2 tablets by mouth 2 (two) times daily.   Yes [provider]  potassium chloride  SA (KLOR-CON  M) 20 MEQ tablet Take 20 mEq by mouth daily. 08/26/22  Yes [provider]  predniSONE  (DELTASONE ) 10 MG tablet Take 10 mg by mouth daily as needed (flares). 07/15/23  Yes [provider]  pregabalin (LYRICA) 300 MG capsule Take 300 mg by mouth 2 (two) times daily. 12/18/22  Yes [provider]  progesterone (PROMETRIUM) 100 MG capsule Take 100 mg by mouth at bedtime. 12/17/22  Yes [provider]  Semaglutide-Weight Management (WEGOVY) 2.4 MG/0.75ML SOAJ Inject 2.4 mg into the skin every Saturday.   Yes [provider]  spironolactone (ALDACTONE) 25 MG tablet Take 25 mg by mouth daily. 12/17/22  Yes [provider]  traZODone (DESYREL) 150 MG tablet Take 150 mg by mouth at bedtime. 12/30/22  Yes [provider]      Allergies    Ak-mycin [erythromycin], Ambien [zolpidem], Aubagio [teriflunomide], Augmentin [amoxicillin -pot clavulanate], Butrans [buprenorphine], Cipro  [ciprofloxacin  hcl], Demerol hcl [meperidine], Depakote  [divalproex  sodium], Fioricet [butalbital-apap-caffeine], Imitrex [sumatriptan], Lunesta [eszopiclone], Ms contin  [morphine ], Nucynta [tapentadol], Pamelor  [nortriptyline ],  Tyloxapol, and Zomig [zolmitriptan]    Review of Systems   Review of Systems  Gastrointestinal:  Positive for constipation and vomiting.  All other systems reviewed and are negative.   Physical Exam Updated Vital Signs BP 103/70 (BP Location: Left Leg)   Pulse 84   Temp 97.8 F (36.6 C)   Resp 18   Ht 5\' 6"  (1.676 m)   Wt 74.8 kg   SpO2 100%   BMI 26.63 kg/m  Physical Exam Vitals and nursing note reviewed.  Constitutional:      Appearance: Joyce Lang is well-developed.  HENT:     Head: Normocephalic.     Right Ear: Tympanic membrane normal.     Left Ear: Tympanic membrane normal.     Nose: Nose normal.     Mouth/Throat:     Mouth: Mucous membranes are moist.  Cardiovascular:     Rate and Rhythm: Normal rate.  Pulmonary:     Effort: Pulmonary effort is normal.  Abdominal:     General: There is no distension.     Palpations: Abdomen is soft.     Tenderness: There is abdominal tenderness.  Musculoskeletal:  General: Normal range of motion.     Cervical back: Normal range of motion.  Skin:    General: Skin is warm.  Neurological:     General: No focal deficit present.     Mental Status: Joyce Lang is alert and oriented to person, place, and time.  Psychiatric:        Mood and Affect: Mood normal.     ED Results / Procedures / Treatments   Labs (all labs ordered are listed, but only abnormal results are displayed) Labs Reviewed  COMPREHENSIVE METABOLIC PANEL WITH GFR - Abnormal; Notable for the following components:      Result Value   Creatinine, Ser 1.01 (*)    Total Protein 6.4 (*)    Alkaline Phosphatase 34 (*)    All other components within normal limits  URINALYSIS, ROUTINE W REFLEX MICROSCOPIC - Abnormal; Notable for the following components:   Hgb urine dipstick SMALL (*)    Bacteria, UA RARE (*)    All other components within normal limits  PROTIME-INR - Abnormal; Notable for the following components:   Prothrombin Time 17.7 (*)    INR 1.4 (*)    All  other components within normal limits  LIPASE, BLOOD  CBC    EKG None  Radiology CT ABDOMEN PELVIS W CONTRAST Result Date: 08/02/2023 CLINICAL DATA:  Acute abdominal pain, vomiting, and constipation for 2 days. EXAM: CT ABDOMEN AND PELVIS WITH CONTRAST TECHNIQUE: Multidetector CT imaging of the abdomen and pelvis was performed using the standard protocol following bolus administration of intravenous contrast. RADIATION DOSE REDUCTION: This exam was performed according to the departmental dose-optimization program which includes automated exposure control, adjustment of the mA and/or kV according to patient size and/or use of iterative reconstruction technique. CONTRAST:  OMNIPAQUE  IOHEXOL  350 MG/ML SOLN COMPARISON:  01/11/2015 FINDINGS: Lower Chest: No acute findings. Hepatobiliary: No suspicious hepatic masses identified. Probable tiny sub-cm cyst in the liver dome. Prior cholecystectomy. No evidence of biliary obstruction. Pneumobilia is seen, consistent with prior sphincterotomy. Pancreas:  No mass or inflammatory changes. Spleen: Within normal limits in size and appearance. Adrenals/Urinary Tract: No suspicious masses identified. No evidence of ureteral calculi or hydronephrosis. Stomach/Bowel: Previous gastric bypass surgery. No evidence of obstruction, inflammatory process or abnormal fluid collections. Large colonic stool burden noted. Vascular/Lymphatic: No pathologically enlarged lymph nodes. No acute vascular findings. Reproductive: Prior hysterectomy noted. Adnexal regions are unremarkable in appearance. Other: Anterior abdominal wall surgical mesh noted, without recurrent hernia. Musculoskeletal:  No suspicious bone lesions identified. IMPRESSION: No acute findings. Large colonic stool burden. Electronically Signed   By: Marlyce Sine M.D.   On: 08/02/2023 11:57    Procedures Procedures    Medications Ordered in ED Medications  HYDROmorphone  (DILAUDID ) injection 1 mg (1 mg  Intravenous Given 08/02/23 0944)  ondansetron  (ZOFRAN ) injection 4 mg (4 mg Intravenous Given 08/02/23 0944)  iohexol  (OMNIPAQUE ) 350 MG/ML injection 100 mL (100 mLs Intravenous Contrast Given 08/02/23 1056)    ED Course/ Medical Decision Making/ A&P                                 Medical Decision Making Pt reports Joyce Lang has a history of bowel obstruction.  Pt complains of severe abdominal pain and no bowel movements  Amount and/or Complexity of Data Reviewed Labs: ordered. Decision-making details documented in ED Course.    Details: Labs ordered reviewed and interpreted.  Cbc is normal  Lipase  is normal.   Radiology: ordered.    Details: Ct abdomen and pelvis shows large stool burden.   Risk OTC drugs. Risk Details: Patient counseled on CT findings.  Patient reports that Joyce Lang cannot take MiraLAX .  Patient has been taking Senokot without relief.  Patient may have increased constipation due to chronic narcotic use. Pt given rx for linzess.            Final Clinical Impression(s) / ED Diagnoses Final diagnoses:  Constipation, unspecified constipation type    Rx / DC Orders ED Discharge Orders          Ordered    linaclotide (LINZESS) 72 MCG capsule  Daily before breakfast        08/02/23 1243    linaclotide (LINZESS) 72 MCG capsule  Daily before breakfast        Pending          An After Visit Summary was printed and given to the patient.     Su Duma K, PA-C 08/02/23 1523    Mozell Arias, MD 08/04/23 1420

## 2023-08-02 NOTE — ED Triage Notes (Signed)
 Pt c.o emesis and constipation for the past 2 days, hx of bowel obstruction and states this feels similar.

## 2023-08-02 NOTE — ED Notes (Signed)
Pt provided urine cup for collection

## 2023-08-02 NOTE — ED Notes (Signed)
 IV established. Pt ready for CT.

## 2023-08-02 NOTE — Discharge Instructions (Signed)
 Follow up with your Gi doctor for recheck.  Try Linzess for constipation.

## 2023-09-02 ENCOUNTER — Other Ambulatory Visit: Payer: Self-pay

## 2023-09-03 ENCOUNTER — Telehealth: Payer: Self-pay

## 2023-09-03 NOTE — Telephone Encounter (Signed)
 Auth Submission: NO AUTH NEEDED Site of care: Site of care: [facility] Payer: [employer] fep Medication & CPT/J Code(s) submitted: solu-medrol  j2919 Diagnosis Code:  Route of submission (phone, fax, portal): portal Phone # Fax # Auth type: Buy/Bill PB Units/visits requested: 1071m x 5doses Reference number: [id number] Approval from: 09/03/23 to 03/03/24

## 2023-09-09 ENCOUNTER — Telehealth: Payer: Self-pay

## 2023-09-09 ENCOUNTER — Ambulatory Visit (INDEPENDENT_AMBULATORY_CARE_PROVIDER_SITE_OTHER)

## 2023-09-09 VITALS — BP 91/60 | HR 81 | Temp 98.3°F | Resp 16 | Ht 66.0 in | Wt 159.0 lb

## 2023-09-09 DIAGNOSIS — G35 Multiple sclerosis: Secondary | ICD-10-CM

## 2023-09-09 MED ORDER — PANTOPRAZOLE SODIUM 40 MG IV SOLR
40.0000 mg | Freq: Once | INTRAVENOUS | Status: AC
  Administered 2023-09-09: 40 mg via INTRAVENOUS
  Filled 2023-09-09: qty 10

## 2023-09-09 MED ORDER — SODIUM CHLORIDE 0.9 % IV SOLN
1000.0000 mg | Freq: Once | INTRAVENOUS | Status: AC
  Administered 2023-09-09: 1000 mg via INTRAVENOUS
  Filled 2023-09-09: qty 16

## 2023-09-09 MED ORDER — SODIUM CHLORIDE 0.9 % IV BOLUS
500.0000 mL | Freq: Once | INTRAVENOUS | Status: AC
  Administered 2023-09-09: 500 mL via INTRAVENOUS
  Filled 2023-09-09: qty 500

## 2023-09-09 NOTE — Telephone Encounter (Signed)
 Called and spoke with Marval NOVAK., Medical Assistant at [facility] [phone]). Relayed that patient presented today for first Solumedrol infusion with sitting BP 78/50, standing BP 85/55. Patient asymptomatic. Requested advice from ordering provider Dr. Glory Cress, MD about whether to proceed with today's infusion. Debbie B. Spoke directly with provider, who stated ok to proceed with planned infusion today. Dr. Cress also ordered 500 mL NS bolus today. Confirmed orders via read back.   Rocky FORBES Sar, RN

## 2023-09-09 NOTE — Progress Notes (Signed)
 Diagnosis: Multiple Sclerosis  Provider:  Mannam, Praveen MD  Procedure: IV Infusion  IV Type: Peripheral, IV Location: R Antecubital  Solumedrol (Methylprednisolone ), Dose: 1000 mg  Infusion Start Time: 1543  Infusion Stop Time: 1644   Normal Saline, Dose: 500 ml  Infusion Start Time: 1540  Infusion Stop Time: 1615   Protonix  (pantoprazole ), Dose: 40 mg  Post Infusion IV Care: Peripheral IV Discontinued  Discharge: Condition: Good, Destination: Home . AVS Declined  Performed by:  Rocky FORBES Sar, RN

## 2023-09-10 ENCOUNTER — Ambulatory Visit (INDEPENDENT_AMBULATORY_CARE_PROVIDER_SITE_OTHER)

## 2023-09-10 VITALS — BP 92/52 | HR 87 | Temp 98.6°F | Resp 16 | Ht 66.0 in | Wt 159.0 lb

## 2023-09-10 DIAGNOSIS — G35 Multiple sclerosis: Secondary | ICD-10-CM | POA: Diagnosis not present

## 2023-09-10 MED ORDER — PANTOPRAZOLE SODIUM 40 MG IV SOLR
40.0000 mg | Freq: Once | INTRAVENOUS | Status: AC
  Administered 2023-09-10: 40 mg via INTRAVENOUS
  Filled 2023-09-10: qty 10

## 2023-09-10 MED ORDER — METHYLPREDNISOLONE SODIUM SUCC 1000 MG IJ SOLR
1000.0000 mg | Freq: Once | INTRAMUSCULAR | Status: AC
  Administered 2023-09-10: 1000 mg via INTRAVENOUS
  Filled 2023-09-10: qty 16

## 2023-09-10 NOTE — Progress Notes (Signed)
 Diagnosis: Multiple Sclerosis  Provider:  Lonna Coder MD  Procedure: IV Infusion  IV Type: Peripheral, IV Location: R Antecubital  Solumedrol (Methylprednisolone ), Dose: 1000 mg  Infusion Start Time: 1549  Infusion Stop Time: 1650  Post Infusion IV Care: Peripheral IV Discontinued  Discharge: Condition: Good, Destination: Home . AVS Declined  Performed by:  Eleanor DELENA Bloch, RN     Pantoprazole  (Protonix ), Dose: 40mg   PIV Discontinued

## 2023-09-11 ENCOUNTER — Ambulatory Visit (INDEPENDENT_AMBULATORY_CARE_PROVIDER_SITE_OTHER)

## 2023-09-11 VITALS — BP 115/78 | HR 70 | Temp 98.4°F | Resp 18 | Ht 66.0 in | Wt 159.0 lb

## 2023-09-11 DIAGNOSIS — G35 Multiple sclerosis: Secondary | ICD-10-CM

## 2023-09-11 MED ORDER — METHYLPREDNISOLONE SODIUM SUCC 1000 MG IJ SOLR
1000.0000 mg | Freq: Once | INTRAMUSCULAR | Status: AC
  Administered 2023-09-11: 1000 mg via INTRAVENOUS
  Filled 2023-09-11: qty 16

## 2023-09-11 MED ORDER — PANTOPRAZOLE SODIUM 40 MG IV SOLR
40.0000 mg | Freq: Once | INTRAVENOUS | Status: AC
  Administered 2023-09-11: 40 mg via INTRAVENOUS
  Filled 2023-09-11: qty 10

## 2023-09-11 NOTE — Progress Notes (Signed)
 Diagnosis: Multiple Sclerosis  Provider:  Mannam, Praveen MD  Procedure: IV Infusion  IV Type: Peripheral, IV Location: L Antecubital  Solumedrol (Methylprednisolone ), Dose: 1000 mg  Infusion Start Time: 0906  Infusion Stop Time: 1008   Procedure: IV Push  Protonix  (pantoprazole ), Dose: 40 mg   Post Infusion IV Care: Peripheral IV Discontinued  Discharge: Condition: Stable, Destination: Home . AVS Declined  Performed by:  Rocky FORBES Sar, RN

## 2023-09-12 ENCOUNTER — Ambulatory Visit (INDEPENDENT_AMBULATORY_CARE_PROVIDER_SITE_OTHER)

## 2023-09-12 VITALS — BP 101/64 | HR 71 | Temp 97.7°F | Resp 16 | Ht 66.0 in | Wt 160.0 lb

## 2023-09-12 DIAGNOSIS — G35 Multiple sclerosis: Secondary | ICD-10-CM

## 2023-09-12 MED ORDER — PANTOPRAZOLE SODIUM 40 MG IV SOLR
40.0000 mg | Freq: Once | INTRAVENOUS | Status: AC
  Administered 2023-09-12: 40 mg via INTRAVENOUS
  Filled 2023-09-12: qty 10

## 2023-09-12 MED ORDER — SODIUM CHLORIDE 0.9 % IV SOLN
1000.0000 mg | Freq: Once | INTRAVENOUS | Status: AC
  Administered 2023-09-12: 1000 mg via INTRAVENOUS
  Filled 2023-09-12: qty 16

## 2023-09-12 NOTE — Progress Notes (Signed)
 Diagnosis: Multiple Sclerosis  Provider:  Lonna Coder MD  Procedure: IV Infusion  IV Type: Peripheral, IV Location: R Antecubital  Solumedrol (Methylprednisolone ), Dose: 1000 mg  Infusion Start Time: 0846  Infusion Stop Time: 0949  Post Infusion IV Care: Peripheral IV Discontinued  Discharge: Condition: Good, Destination: Home . AVS Declined  Performed by:  Suman Trivedi, RN

## 2023-09-15 ENCOUNTER — Ambulatory Visit

## 2023-09-15 ENCOUNTER — Ambulatory Visit (INDEPENDENT_AMBULATORY_CARE_PROVIDER_SITE_OTHER)

## 2023-09-15 VITALS — BP 98/64 | HR 80 | Temp 98.3°F | Resp 12 | Ht 66.0 in | Wt 162.0 lb

## 2023-09-15 DIAGNOSIS — G35 Multiple sclerosis: Secondary | ICD-10-CM

## 2023-09-15 MED ORDER — METHYLPREDNISOLONE SODIUM SUCC 1000 MG IJ SOLR
1000.0000 mg | Freq: Once | INTRAMUSCULAR | Status: AC
  Administered 2023-09-15: 1000 mg via INTRAVENOUS
  Filled 2023-09-15: qty 16

## 2023-09-15 MED ORDER — PANTOPRAZOLE SODIUM 40 MG IV SOLR
40.0000 mg | Freq: Once | INTRAVENOUS | Status: AC
  Administered 2023-09-15: 40 mg via INTRAVENOUS
  Filled 2023-09-15: qty 10

## 2023-09-15 NOTE — Progress Notes (Signed)
 Diagnosis: Multiple Sclerosis  Provider:  Mannam, Praveen MD  Procedure: IV Infusion  IV Type: Peripheral, IV Location: L Antecubital  Solumedrol (Methylprednisolone ), Dose: 1000 mg  Infusion Start Time: 0842  Infusion Stop Time: 0950   Procedure: IV Push  Protonix  (pantoprazole ), Dose: 40 mg   Post Infusion IV Care: Peripheral IV Discontinued  Discharge: Condition: Stable, Destination: Home . AVS Declined  Performed by:  Rocky FORBES Sar, RN

## 2023-09-16 ENCOUNTER — Ambulatory Visit

## 2023-09-17 ENCOUNTER — Ambulatory Visit

## 2023-09-18 ENCOUNTER — Ambulatory Visit

## 2023-09-19 ENCOUNTER — Ambulatory Visit

## 2024-01-30 ENCOUNTER — Other Ambulatory Visit (HOSPITAL_COMMUNITY): Payer: Self-pay | Admitting: Surgical

## 2024-01-30 DIAGNOSIS — Z0189 Encounter for other specified special examinations: Secondary | ICD-10-CM

## 2024-02-04 ENCOUNTER — Ambulatory Visit (HOSPITAL_COMMUNITY): Admission: RE | Admit: 2024-02-04 | Source: Ambulatory Visit

## 2024-02-07 ENCOUNTER — Encounter (HOSPITAL_COMMUNITY): Payer: Self-pay | Admitting: Surgical

## 2024-02-07 ENCOUNTER — Other Ambulatory Visit (HOSPITAL_COMMUNITY): Payer: Self-pay | Admitting: Surgical

## 2024-02-07 ENCOUNTER — Ambulatory Visit (HOSPITAL_COMMUNITY): Admission: RE | Admit: 2024-02-07 | Discharge: 2024-02-07 | Attending: Surgical | Admitting: Surgical

## 2024-02-07 ENCOUNTER — Encounter (HOSPITAL_COMMUNITY): Payer: Self-pay | Admitting: Radiology

## 2024-02-07 DIAGNOSIS — Z0189 Encounter for other specified special examinations: Secondary | ICD-10-CM

## 2024-02-07 MED ORDER — GADOBUTROL 1 MMOL/ML IV SOLN
7.0000 mL | Freq: Once | INTRAVENOUS | Status: AC | PRN
  Administered 2024-02-07: 7 mL via INTRAVENOUS

## 2024-02-29 ENCOUNTER — Telehealth: Admitting: Physician Assistant

## 2024-02-29 DIAGNOSIS — B9689 Other specified bacterial agents as the cause of diseases classified elsewhere: Secondary | ICD-10-CM | POA: Diagnosis not present

## 2024-02-29 DIAGNOSIS — J019 Acute sinusitis, unspecified: Secondary | ICD-10-CM

## 2024-02-29 MED ORDER — DOXYCYCLINE HYCLATE 100 MG PO TABS: 100.0000 mg | ORAL_TABLET | Freq: Two times a day (BID) | ORAL | 0 refills | Status: AC

## 2024-02-29 NOTE — Progress Notes (Signed)
 E-Visit for Sinus Problems  We are sorry that you are not feeling well.  Here is how we plan to help!  Based on what you have shared with me it looks like you have sinusitis.  Sinusitis is inflammation and infection in the sinus cavities of the head.  Based on your presentation I believe you most likely have Acute Bacterial Sinusitis.  This is an infection caused by bacteria and is treated with antibiotics. I have prescribed Doxycycline 100mg  by mouth twice a day for 7 days. You may use an oral decongestant such as Mucinex D or if you have glaucoma or high blood pressure use plain Mucinex. Saline nasal spray help and can safely be used as often as needed for congestion.  If you develop worsening sinus pain, fever or notice severe headache and vision changes, or if symptoms are not better after completion of antibiotic, please schedule an appointment with a health care provider.    Sinus infections are not as easily transmitted as other respiratory infection, however we still recommend that you avoid close contact with loved ones, especially the very young and elderly.  Remember to wash your hands thoroughly throughout the day as this is the number one way to prevent the spread of infection!  Home Care: Only take medications as instructed by your medical team. Complete the entire course of an antibiotic. Do not take these medications with alcohol. A steam or ultrasonic humidifier can help congestion.  You can place a towel over your head and breathe in the steam from hot water coming from a faucet. Avoid close contacts especially the very young and the elderly. Cover your mouth when you cough or sneeze. Always remember to wash your hands.  Get Help Right Away If: You develop worsening fever or sinus pain. You develop a severe head ache or visual changes. Your symptoms persist after you have completed your treatment plan.  Make sure you Understand these instructions. Will watch your  condition. Will get help right away if you are not doing well or get worse.  Your e-visit answers were reviewed by a board certified advanced clinical practitioner to complete your personal care plan.  Depending on the condition, your plan could have included both over the counter or prescription medications.  If there is a problem please reply  once you have received a response from your provider.  Your safety is important to us .  If you have drug allergies check your prescription carefully.    You can use MyChart to ask questions about today's visit, request a non-urgent call back, or ask for a work or school excuse for 24 hours related to this e-Visit. If it has been greater than 24 hours you will need to follow up with your provider, or enter a new e-Visit to address those concerns.  You will get an e-mail in the next two days asking about your experience.  I hope that your e-visit has been valuable and will speed your recovery. Thank you for using e-visits.  I have spent 5 minutes in review of e-visit questionnaire, review and updating patient chart, medical decision making and response to patient.   Delon CHRISTELLA Dickinson, PA-C
